# Patient Record
Sex: Male | Born: 1970 | Race: Black or African American | Hispanic: No | Smoking: Current some day smoker
Health system: Southern US, Community
[De-identification: ages and names within clinical notes are randomized; demographics above are authoritative.]

## PROBLEM LIST (undated history)

## (undated) DIAGNOSIS — S72009A Fracture of unspecified part of neck of unspecified femur, initial encounter for closed fracture: Secondary | ICD-10-CM

## (undated) DIAGNOSIS — M069 Rheumatoid arthritis, unspecified: Secondary | ICD-10-CM

## (undated) DIAGNOSIS — M1990 Unspecified osteoarthritis, unspecified site: Secondary | ICD-10-CM

## (undated) DIAGNOSIS — K5090 Crohn's disease, unspecified, without complications: Secondary | ICD-10-CM

## (undated) DIAGNOSIS — T7840XA Allergy, unspecified, initial encounter: Secondary | ICD-10-CM

## (undated) DIAGNOSIS — I10 Essential (primary) hypertension: Secondary | ICD-10-CM

## (undated) HISTORY — DX: Crohn's disease, unspecified, without complications: K50.90

## (undated) HISTORY — DX: Allergy, unspecified, initial encounter: T78.40XA

---

## 2011-08-09 ENCOUNTER — Encounter (HOSPITAL_COMMUNITY): Payer: Self-pay | Admitting: *Deleted

## 2011-08-09 ENCOUNTER — Emergency Department (HOSPITAL_COMMUNITY)
Admission: EM | Admit: 2011-08-09 | Discharge: 2011-08-09 | Disposition: A | Discharge status: Home / Self Care | Payer: Medicaid - Out of State | Attending: Emergency Medicine | Admitting: Emergency Medicine

## 2011-08-09 DIAGNOSIS — Z79899 Other long term (current) drug therapy: Secondary | ICD-10-CM | POA: Insufficient documentation

## 2011-08-09 DIAGNOSIS — R109 Unspecified abdominal pain: Secondary | ICD-10-CM | POA: Insufficient documentation

## 2011-08-09 DIAGNOSIS — R609 Edema, unspecified: Secondary | ICD-10-CM | POA: Insufficient documentation

## 2011-08-09 DIAGNOSIS — W010XXA Fall on same level from slipping, tripping and stumbling without subsequent striking against object, initial encounter: Secondary | ICD-10-CM | POA: Insufficient documentation

## 2011-08-09 DIAGNOSIS — M7989 Other specified soft tissue disorders: Secondary | ICD-10-CM

## 2011-08-09 DIAGNOSIS — M79609 Pain in unspecified limb: Secondary | ICD-10-CM

## 2011-08-09 DIAGNOSIS — M25473 Effusion, unspecified ankle: Secondary | ICD-10-CM | POA: Insufficient documentation

## 2011-08-09 DIAGNOSIS — M549 Dorsalgia, unspecified: Secondary | ICD-10-CM | POA: Insufficient documentation

## 2011-08-09 DIAGNOSIS — S32609A Unspecified fracture of unspecified ischium, initial encounter for closed fracture: Secondary | ICD-10-CM | POA: Insufficient documentation

## 2011-08-09 DIAGNOSIS — I10 Essential (primary) hypertension: Secondary | ICD-10-CM | POA: Insufficient documentation

## 2011-08-09 DIAGNOSIS — M25559 Pain in unspecified hip: Secondary | ICD-10-CM | POA: Insufficient documentation

## 2011-08-09 DIAGNOSIS — M25476 Effusion, unspecified foot: Secondary | ICD-10-CM | POA: Insufficient documentation

## 2011-08-09 DIAGNOSIS — S32601S Unspecified fracture of right ischium, sequela: Secondary | ICD-10-CM

## 2011-08-09 HISTORY — DX: Fracture of unspecified part of neck of unspecified femur, initial encounter for closed fracture: S72.009A

## 2011-08-09 HISTORY — DX: Essential (primary) hypertension: I10

## 2011-08-09 MED ORDER — OXYCODONE-ACETAMINOPHEN 5-325 MG PO TABS
2.0000 | ORAL_TABLET | Freq: Once | ORAL | Status: AC
  Administered 2011-08-09: 2 via ORAL
  Filled 2011-08-09: qty 2

## 2011-08-09 MED ORDER — OXYCODONE-ACETAMINOPHEN 5-325 MG PO TABS: ORAL_TABLET | ORAL | Status: AC

## 2011-08-09 NOTE — ED Provider Notes (Signed)
History   This chart was scribed for Mark [name]. Mark Lamas, MD by Mark [name]. The patient was seen in room STRE2/STRE2. Patient's care was started at 1347.    CSN: [id number]  Arrival date & time 08/09/11  1347   None     Chief Complaint  Patient presents with  . Hip Pain    (Consider location/radiation/quality/duration/timing/severity/associated sxs/prior treatment) HPI Mark [name] is a 41 y.o. male who presents to the Emergency Department complaining of constant moderate to severe pain to pelvic region onset 2 weeks ago and persistent since. Patient notes pain is aggravated with movement. Denies numbness, tingling, weakness, nausea, vomiting. Patient notes he was evaluated in [place], [place] on July 27, 2011 and was informed he had fractured his Ischium. Patient notes he sustained fracture during fall in which he was standing on a hill and tripped causing him to land on his left side. Patient notes he was set to move/relocate from [place] to [place], [place] the day after the evaluation was performed in [place], [place] and has thus been unable to follow up with another provider regarding further treatment and evaluation. Patient with h/o HTN.   Past Medical History  Diagnosis Date  . Hip fracture   . Hypertension     History reviewed. No pertinent past surgical history.  History reviewed. No pertinent family history.  History  Substance Use Topics  . Smoking status: Not on file  . Smokeless tobacco: Not on file  . Alcohol Use: No      Review of Systems  Constitutional: Negative for fever and chills.  Respiratory: Negative for cough and shortness of breath.   Cardiovascular: Negative for chest pain.  Gastrointestinal: Negative for nausea and vomiting.  Genitourinary: Negative for difficulty urinating.  Musculoskeletal: Positive for back pain and arthralgias.       Pelvic pain.   Skin: Negative for rash and wound.  Neurological: Negative for weakness and  numbness.    Allergies  Review of patient's allergies indicates no known allergies.  Home Medications   Current Outpatient Rx  Name Route Sig Dispense Refill  . VITAMIN D PO Oral Take 1 tablet by mouth daily.    . IBUPROFEN 200 MG PO TABS Oral Take 600 mg by mouth every 6 (six) hours as needed. For pain.    . OXYCODONE-ACETAMINOPHEN 5-325 MG PO TABS Oral Take 1 tablet by mouth every 4 (four) hours as needed. For pain.    . OXYCODONE-ACETAMINOPHEN 5-325 MG PO TABS  1-2 tablets po q 6 hours prn moderate to severe pain 20 tablet 0    BP 148/82  Pulse 90  Temp(Src) 98.1 F (36.7 C) (Oral)  Resp 18  SpO2 99%  Physical Exam  Nursing note and vitals reviewed. Constitutional: He is oriented to person, place, and time. He appears well-developed and well-nourished. No distress.  HENT:  Head: Normocephalic and atraumatic.  Eyes: EOM are normal.  Neck: Neck supple. No tracheal deviation present.  Cardiovascular: Normal rate.   Pulmonary/Chest: Effort normal. No respiratory distress.  Musculoskeletal: Normal range of motion. He exhibits edema (1+ pitting edema of RLE and right ankle. ).       Right ankle: Achilles tendon normal.       DP and PT pulses intact within RLE. Tender right hip to palpation,. Pain with ROM of RLE.  1+ edema to right foot, ankle, lower leg.    Neurological: He is alert and oriented to person, place, and time. He has normal strength.  No sensory deficit. GCS eye subscore is 4. GCS verbal subscore is 5. GCS motor subscore is 6.       Gross sensation and strength intact within RLE.   Skin: Skin is warm and dry.  Psychiatric: He has a normal mood and affect. His behavior is normal.    ED Course  Procedures (including critical care time)  DIAGNOSTIC STUDIES: Oxygen Saturation is 99% on room air, normal by my interpretation.    COORDINATION OF CARE: 2:45PM-Patient informed of current plan for treatment and evaluation and agrees with plan at this time. Patient's  accompanying records from evaluation performed in [place] reviewed.     Labs Reviewed - No data to display No results found.   1. Ischium fracture, right, sequela     4:20 PM Prelim report for U/S doppler for DVT is negative by report.  Will d/c home.  Pt is instructed on elevation while at rest.  MDM  I personally performed the services described in this documentation, which was scribed in my presence. The recorded information has been reviewed and considered.   I reviewed the films brought by pt from [place].  Shows non displaced ischium fracture, involved acetabulum.  I discussed with orthopedist on call, agrees to help arrange follow up.  Percocet refilled.  Due to edema, will get U/S to r/o DVT.  Low pretest suspicion however.     Mark [name]. Mark Alan, MD 08/09/11 1610

## 2011-08-09 NOTE — Discharge Instructions (Signed)
Narcotic and benzodiazepine use may cause drowsiness, slowed breathing or dependence.  Please use with caution and do not drive, operate machinery or watch young children alone while taking them.  Taking combinations of these medications or drinking alcohol will potentiate these effects.    

## 2011-08-09 NOTE — ED Notes (Signed)
Reports fracturing right hip 4/25 when [place] lived in [place], has been unable to make follow up appt since moving here and having severe pain, ambulatory at triage with crutches.

## 2011-08-09 NOTE — Progress Notes (Signed)
Right lower extremity venous duplex completed at 15:00.  Preliminary report is negative for DVT, SVT, or a Baker's cyst in the right leg.  Negative for DVT in the left common femoral vein.

## 2011-08-11 ENCOUNTER — Other Ambulatory Visit (HOSPITAL_COMMUNITY): Payer: Self-pay | Admitting: Orthopedic Surgery

## 2011-08-11 DIAGNOSIS — M545 Low back pain, unspecified: Secondary | ICD-10-CM

## 2011-08-16 ENCOUNTER — Ambulatory Visit (HOSPITAL_COMMUNITY)
Admission: RE | Admit: 2011-08-16 | Discharge: 2011-08-16 | Disposition: A | Discharge status: Home / Self Care | Payer: BC Managed Care – HMO | Source: Ambulatory Visit | Attending: Orthopedic Surgery | Admitting: Orthopedic Surgery

## 2011-08-16 DIAGNOSIS — M545 Low back pain, unspecified: Secondary | ICD-10-CM

## 2013-07-12 ENCOUNTER — Other Ambulatory Visit: Payer: Self-pay

## 2013-07-12 ENCOUNTER — Encounter (HOSPITAL_COMMUNITY): Payer: Self-pay | Admitting: Emergency Medicine

## 2013-07-12 ENCOUNTER — Emergency Department (HOSPITAL_COMMUNITY)
Admission: EM | Admit: 2013-07-12 | Discharge: 2013-07-12 | Disposition: A | Discharge status: Home / Self Care | Payer: Medicare (Managed Care) | Attending: Emergency Medicine | Admitting: Emergency Medicine

## 2013-07-12 ENCOUNTER — Emergency Department (HOSPITAL_COMMUNITY): Payer: Medicare (Managed Care)

## 2013-07-12 DIAGNOSIS — I10 Essential (primary) hypertension: Secondary | ICD-10-CM | POA: Insufficient documentation

## 2013-07-12 DIAGNOSIS — Z8781 Personal history of (healed) traumatic fracture: Secondary | ICD-10-CM | POA: Insufficient documentation

## 2013-07-12 DIAGNOSIS — R0789 Other chest pain: Secondary | ICD-10-CM

## 2013-07-12 DIAGNOSIS — R071 Chest pain on breathing: Secondary | ICD-10-CM | POA: Insufficient documentation

## 2013-07-12 LAB — CBC
HEMATOCRIT: 39.2 % (ref 39.0–52.0)
Hemoglobin: 13.2 g/dL (ref 13.0–17.0)
MCH: 30.8 pg (ref 26.0–34.0)
MCHC: 33.7 g/dL (ref 30.0–36.0)
MCV: 91.4 fL (ref 78.0–100.0)
Platelets: 378 10*3/uL (ref 150–400)
RBC: 4.29 MIL/uL (ref 4.22–5.81)
RDW: 14.5 % (ref 11.5–15.5)
WBC: 10.8 10*3/uL — ABNORMAL HIGH (ref 4.0–10.5)

## 2013-07-12 LAB — BASIC METABOLIC PANEL
BUN: 15 mg/dL (ref 6–23)
CALCIUM: 9.1 mg/dL (ref 8.4–10.5)
CHLORIDE: 100 meq/L (ref 96–112)
CO2: 26 mEq/L (ref 19–32)
CREATININE: 0.81 mg/dL (ref 0.50–1.35)
Glucose, Bld: 82 mg/dL (ref 70–99)
Potassium: 4 mEq/L (ref 3.7–5.3)
Sodium: 140 mEq/L (ref 137–147)

## 2013-07-12 LAB — I-STAT TROPONIN, ED: Troponin i, poc: 0 ng/mL (ref 0.00–0.08)

## 2013-07-12 MED ORDER — CYCLOBENZAPRINE HCL 10 MG PO TABS: 10.0000 mg | ORAL_TABLET | Freq: Two times a day (BID) | ORAL | Status: DC | PRN

## 2013-07-12 MED ORDER — KETOROLAC TROMETHAMINE 30 MG/ML IJ SOLN: 30.0000 mg | Freq: Once | INTRAMUSCULAR | Status: DC

## 2013-07-12 MED ORDER — KETOROLAC TROMETHAMINE 60 MG/2ML IM SOLN
60.0000 mg | Freq: Once | INTRAMUSCULAR | Status: AC
  Administered 2013-07-12: 60 mg via INTRAMUSCULAR
  Filled 2013-07-12: qty 2

## 2013-07-12 MED ORDER — NAPROXEN 500 MG PO TABS: 500.0000 mg | ORAL_TABLET | Freq: Two times a day (BID) | ORAL | Status: DC

## 2013-07-12 NOTE — ED Provider Notes (Signed)
Medical screening examination/treatment/procedure(s) were performed by non-physician practitioner and as supervising physician I was immediately available for consultation/collaboration.   EKG Interpretation   Date/Time:  Saturday July 12 2013 10:13:50 EDT Ventricular Rate:  78 PR Interval:  156 QRS Duration: 92 QT Interval:  356 QTC Calculation: 405 R Axis:   63 Text Interpretation:  Normal sinus rhythm Normal ECG No old tracing to  compare Confirmed by Winter Jocelyn  MD-I, Javaun Dimperio ([id number]) on 07/12/2013 12:24:27 PM      Devoria Albe, MD, Franz Dell, MD 07/12/13 1556

## 2013-07-12 NOTE — Discharge Instructions (Signed)
Take Naprosyn as needed for pain. Take Flexeril as needed for muscle spasm. You may take these medications together. Follow up with a primary care provider from the resource guide. Return to the ED with worsening or concerning symptoms.    Emergency Department Resource Guide 1) Find a Doctor and Pay Out of Pocket Although you won't have to find out who is covered by your insurance plan, it is a good idea to ask around and get recommendations. You will then need to call the office and see if the doctor you have chosen will accept you as a new patient and what types of options they offer for patients who are self-pay. Some doctors offer discounts or will set up payment plans for their patients who do not have insurance, but you will need to ask so you aren't surprised when you get to your appointment.  2) Contact Your Local Health Department Not all health departments have doctors that can see patients for sick visits, but many do, so it is worth a call to see if yours does. If you don't know where your local health department is, you can check in your phone book. The CDC also has a tool to help you locate your state's health department, and many state websites also have listings of all of their local health departments.  3) Find a Walk-in Clinic If your illness is not likely to be very severe or complicated, you may want to try a walk in clinic. These are popping up all over the country in pharmacies, drugstores, and shopping centers. They're usually staffed by nurse practitioners or physician assistants that have been trained to treat common illnesses and complaints. They're usually fairly quick and inexpensive. However, if you have serious medical issues or chronic medical problems, these are probably not your best option.  No Primary Care Doctor: - Call Health Connect at  [phone] - they can help you locate a primary care doctor that  accepts your insurance, provides certain services,  etc. - Physician Referral Service- [phone]  Chronic Pain Problems: Organization         Address  Phone   Notes  [facility]  [phone] Patients need to be referred by their primary care doctor.   Medication Assistance: Organization         Address  Phone   Notes  [facility] Medication [facility] [address] [place] [place], [place] [address] [phone] --Must be a resident of [facility] -- Must have NO insurance coverage whatsoever (no Medicaid/ Medicare, etc.) -- The pt. MUST have a primary care doctor that directs their care regularly and follows them in the community   [facility]  [phone]   [employer]  [phone]    Agencies that provide inexpensive medical care: Organization         Address  Phone   Notes  [facility]  [phone]   [facility]    [phone]   [facility] [address] [place], [place] [address] [phone]   [facility] [address] [place] [place] [phone]   Planned Parenthood    [phone]   [facility]    [phone]   [facility]  [address], [place] Phone:  [phone], Fax:  [phone] Hours of Operation:  9 am - 6 pm, M-F.  Also accepts Medicaid/Medicare and  self-pay.  [facility] for [place], [address], [place] Phone: [phone], Fax: [phone]. Hours of Operation:  8:30 am - 5:30 pm, M-F.  Also accepts Medicaid and self-pay.  [facility] [place] [address] [place] Phone: [phone]   [place], [place], [place] [phone], Ext. [phone] Mondays & Thursdays: 7-9 AM.  First 15 patients are seen on a first come, first serve basis.    [place] Providers:  Organization         Address  Phone   Notes  [facility] [address], [place] [phone] Also accepts self-pay patients.  [facility] [id number] [place], [place]  [phone]   [place], [address], [place] [phone]   [facility] [address], [place] [phone]   Lucianne Lei [address], [place]   [phone] Only accepts [place] Access [place] patients after they have their name applied to their card.   Self-Pay (no insurance) in [facility]:  Organization         Address  Phone   Notes  Sickle Cell Patients, [facility] [place] [phone]   [facility] [place] [phone]   [facility] [place]  [place], [place], [place] [phone]   [facility]/Dr. Osei-Bonsu  [address], [place] or [address], [place] [phone] Phone number for both [place] and [place] locations is the same.  Urgent Medical and [facility] [place] [address] [place] [phone]   [facility] [address], [place] or [address] [phone]   [facility] [address] [place] [phone], phone; [phone], fax Sees patients 1st and 3rd Saturday of every month.  Must not qualify for public or private insurance (i.e. Medicaid, Medicare, [place] Health Choice, Veterans' Benefits)  Household income should be no more than 200% of the poverty level The clinic cannot treat you if you are pregnant or think you are pregnant  Sexually transmitted diseases are not treated at the clinic.    Dental Care: Organization         Address  Phone  Notes  [facility] [place] [phone] Accepts children up to  age 32 who are enrolled in [place] or [place]; pregnant women with a Medicaid card; and children who have applied for Medicaid or [place] Health Choice, but were declined, whose parents can pay a reduced fee at time of service.  [facility] Department of [facility]  [address], [place] [phone] Accepts children up to age 69 who are enrolled in [place] or [place]; pregnant women with a Medicaid card; and children who have applied for Medicaid or  Health Choice, but were declined, whose parents can pay a reduced fee at time of service.  [facility] Access PROGRAM  [place] [phone] Patients are seen by appointment only. Walk-ins are not accepted. [place] will see patients 72 years of age and older. Monday - Tuesday (8am-5pm) Most Wednesdays (8:30-5pm) $  30 per visit, cash only  [facility] Adult [employer] PROGRAM  [address], [facility] [phone] Patients are seen by appointment only. Walk-ins are not accepted. [facility] will see patients 1 years of age and older. One Wednesday Evening (Monthly: Volunteer Based).  $30 per visit, cash only  [employer]  [phone] for adults; Children under age 5, call Graduate Pediatric Dentistry at [phone]. Children aged 26-14, please call [phone] to request a pediatric application.  Dental services are provided in all areas of dental care including fillings, crowns and bridges, complete and partial dentures, implants, gum treatment, root canals, and extractions. Preventive care is also provided. Treatment is provided to both adults and children. Patients are selected via a lottery and there is often a waiting list.   [facility] [address] [place]  [phone] [email or web address]   [facility] [address] [place], [place] [phone], Ext. [phone] Second and Fourth Thursday of  each month, opens at 6:30 AM; Clinic ends at 9 AM.  Patients are seen on a first-come first-served basis, and a limited number are seen during each clinic.   [facility]  [address] Ether [place], [place] [phone]   Eligibility Requirements You must have lived in [place], [place], or [place] counties for at least the last three months.   You cannot be eligible for state or federal sponsored [place], including [employer], [place], or [employer].   You generally cannot be eligible for healthcare insurance through your employer.    How to apply: Eligibility screenings are held every Tuesday and Wednesday afternoon from 1:00 pm until 4:00 pm. You do not need an appointment for the interview!  [facility] [address], [place], [place] [phone]   [facility] Health Department  [phone]   [facility] Health Department  [phone]   [facility] Health Department  [phone]    Behavioral Health Resources in the Community: Intensive Outpatient Programs Organization         Address  Phone  Notes  [facility] Services [address] [place], [place] [phone]   [facility] Outpatient [address] [place], [place] [phone]   ADS: Alcohol & Drug Sv[facility] [address] [place], [place]  [phone]   [facility] [address]  [place], [place] [phone] or [phone]   Substance Abuse Resources Organization         Address  Phone  Notes  Al[facility] Drug Services  [phone]   [facility]  [phone]   [facility] [place]  [phone]   Floydene Flock  [phone]   Residential & Outpatient Substance Abuse Program  [phone]   Psychological Services Organization         Address  Phone  Notes  [facility] Behavior[facility] Health  [phone]   [facility]  [phone]   [facility] Mental Health [address]  [place] [phone] or [phone]    Mobile Crisis Teams Organization         Address  Phone  Notes  [facility], Mobile Crisis Care Unit  [phone]   [facility]  [address] [place], [place] [phone]   Doristine Locks [address] [place] [phone]    Self-Help/Support Groups Organization         Address  Phone  Notes  [facility] [place] - variety of support groups  [place] Call for more information  Narcotics Anonymous (NA), Caring [facility] [address], [employer] [place]  2 meetings at this location   Special educational needs teacher         Address  Phone  Notes  [facility] [place],    [place]  [phone]   [facility]  [address], [place] [phone], [place], [place]   [place], [place] [phone] Admissions: 8am-3pm M-F  Incentives Substance [place] [address],    [place], [place] [phone]   [facility] [address] [place], [place], [place]   [facility] [address]  [place], [place]   [facility] - Intensive Outpatient [address], [place], [place]   [facility] ([place]  [place], [place] [phone] or [phone]   [facility] Services (RTS) [address] [place], [place] Accepts Medicaid  [facility] [address],  [place] [phone] Substance Abuse/Addiction Treatment   [facility] Organization         Address  Phone  Notes  [facility]  [phone]   Domenic Schwab, PhD [address] [place], [place]   [phone] or [phone]   [place], [place] [phone]     [facility] [address], [place], [place] [phone] Insurance/Medicaid/sponsorship through [facility] and Families [address], [address] [place]                                    [place], [place] [phone] [place] [address], [place] [phone]    Dr. Adele Schilder  [phone]   [facility] Dept. 1) [address], [place] 2) [place] 3)  [place] [address], [place] [phone]  [phone]   [place] [phone] or [phone] (After Hours)

## 2013-07-12 NOTE — ED Notes (Signed)
Reports left side chest pains that he describes as muscle spasms, increase with movement. Having pain x 2 weeks, more severe x 3 days. Denies recent cough. ekg done at triage.

## 2013-07-12 NOTE — ED Provider Notes (Signed)
CSN: [id number]     Arrival date & time 07/12/13  1011 History   First MD Initiated Contact with Patient 07/12/13 1018     Chief Complaint  Patient presents with  . Chest Pain     (Consider location/radiation/quality/duration/timing/severity/associated sxs/prior Treatment) HPI Comments: Patient is 43 year old male complaining of chest pain for a few weeks. The pain is in the left side of his chest and radiates up the neck. The pain feels "spasm-like" and is worse with movement.  He has no associated nausea or vomiting, fever, diaphoresis, COB, heart palpitations, radiation to the back, abdominal pain, urinary symptoms.  He has tried to take Ibuprofen with minimal relief.  There were no recent injuries or heavy lifting events prior to the onset of pain.  He states that he has been having pain in the right arm as well, but his left sided chest pain is what made him seek treatment.  He denies tobacco, alcohol or illicit drug use.  He is a healthy man with no other major medical concerns and takes no other medications.    Past Medical History  Diagnosis Date  . Hip fracture   . Hypertension    History reviewed. No pertinent past surgical history. History reviewed. No pertinent family history. History  Substance Use Topics  . Smoking status: Not on file  . Smokeless tobacco: Not on file  . Alcohol Use: No    Review of Systems  Constitutional: Negative for fever, chills and fatigue.  HENT: Negative for trouble swallowing.   Eyes: Negative for visual disturbance.  Respiratory: Negative for shortness of breath.   Cardiovascular: Positive for chest pain. Negative for palpitations.  Gastrointestinal: Negative for nausea, vomiting, abdominal pain and diarrhea.  Genitourinary: Negative for dysuria and difficulty urinating.  Musculoskeletal: Negative for arthralgias and neck pain.  Skin: Negative for color change.  Neurological: Negative for dizziness and weakness.  Psychiatric/Behavioral:  Negative for dysphoric mood.      Allergies  Review of patient's allergies indicates no known allergies.  Home Medications   Current Outpatient Rx  Name  Route  Sig  Dispense  Refill  . ibuprofen (ADVIL,MOTRIN) 200 MG tablet   Oral   Take 600 mg by mouth every 6 (six) hours as needed for fever, headache or mild pain. For pain.          BP 144/93  Pulse 92  Temp(Src) 98 F (36.7 C) (Oral)  Resp 17  Ht 5\' 10"  (1.778 m)  Wt 190 lb (86.183 kg)  BMI 27.26 kg/m2  SpO2 98% Physical Exam  Nursing note and vitals reviewed. Constitutional: He is oriented to person, place, and time. He appears well-developed and well-nourished. No distress.  HENT:  Head: Normocephalic and atraumatic.  Eyes: Conjunctivae and EOM are normal.  Neck: Normal range of motion.  Cardiovascular: Normal rate and regular rhythm.  Exam reveals no gallop and no friction rub.   No murmur heard. Pulmonary/Chest: Effort normal and breath sounds normal. He has no wheezes. He has no rales. He exhibits tenderness.  Left chest and trapezius tenderness to palpation.   Abdominal: Soft. He exhibits no distension. There is no tenderness. There is no rebound and no guarding.  Musculoskeletal: Normal range of motion.  Neurological: He is alert and oriented to person, place, and time. Coordination normal.  Speech is goal-oriented. Moves limbs without ataxia.   Skin: Skin is warm and dry.  Psychiatric: He has a normal mood and affect. His behavior is normal.  ED Course  Procedures (including critical care time) Labs Review Labs Reviewed  CBC - Abnormal; Notable for the following:    WBC 10.8 (*)    All other components within normal limits  BASIC METABOLIC PANEL  I-STAT TROPOININ, ED   Imaging Review Dg Chest 2 View  07/12/2013   CLINICAL DATA:  Left chest pain radiating to the left shoulder.  EXAM: CHEST  2 VIEW  COMPARISON:  None.  FINDINGS: The heart size and mediastinal contours are within normal limits.  Both lungs are clear. The visualized skeletal structures are unremarkable.  IMPRESSION: No active cardiopulmonary disease.   Electronically Signed   By: Amie Portland M.D.   On: 07/12/2013 11:11     EKG Interpretation   Date/Time:  Saturday July 12 2013 10:13:50 EDT Ventricular Rate:  78 PR Interval:  156 QRS Duration: 92 QT Interval:  356 QTC Calculation: 405 R Axis:   63 Text Interpretation:  Normal sinus rhythm Normal ECG No old tracing to  compare Confirmed by KNAPP  MD-I, IVA ([id number]) on 07/12/2013 12:24:27 PM      MDM   Final diagnoses:  Chest wall pain    11:27 AM Labs pending. EKG and chest xray show no acute changes. Vitals stable and patient afebrile. Patient will have IV toradol for pain.   12:49 PM Patient is feeling better after IM toradol. Patient's troponin is negative and other labs show no acute changes. Patient likely having muscular pain. Patient will be discharged with Naprosyn and Robaxin. Patient advised to follow up with a PCP from the resource guide. Patient instructed to return to the ED with worsening or concerning symptoms. Patient's HEART score is a 1, making him low risk for major cardiac event.    Emilia Beck, PA-C 07/12/13 1255

## 2014-01-15 ENCOUNTER — Encounter (HOSPITAL_COMMUNITY): Payer: Self-pay | Admitting: Emergency Medicine

## 2014-01-15 ENCOUNTER — Emergency Department (HOSPITAL_COMMUNITY)
Admission: EM | Admit: 2014-01-15 | Discharge: 2014-01-15 | Disposition: A | Discharge status: Home / Self Care | Payer: PRIVATE HEALTH INSURANCE | Attending: Emergency Medicine | Admitting: Emergency Medicine

## 2014-01-15 ENCOUNTER — Emergency Department (HOSPITAL_COMMUNITY): Payer: PRIVATE HEALTH INSURANCE

## 2014-01-15 DIAGNOSIS — Z8781 Personal history of (healed) traumatic fracture: Secondary | ICD-10-CM | POA: Insufficient documentation

## 2014-01-15 DIAGNOSIS — Z791 Long term (current) use of non-steroidal anti-inflammatories (NSAID): Secondary | ICD-10-CM | POA: Insufficient documentation

## 2014-01-15 DIAGNOSIS — I10 Essential (primary) hypertension: Secondary | ICD-10-CM | POA: Insufficient documentation

## 2014-01-15 DIAGNOSIS — Z79899 Other long term (current) drug therapy: Secondary | ICD-10-CM | POA: Insufficient documentation

## 2014-01-15 DIAGNOSIS — M76822 Posterior tibial tendinitis, left leg: Secondary | ICD-10-CM

## 2014-01-15 MED ORDER — NAPROXEN 500 MG PO TABS: 500.0000 mg | ORAL_TABLET | Freq: Two times a day (BID) | ORAL | Status: DC

## 2014-01-15 MED ORDER — TRAMADOL HCL 50 MG PO TABS: 50.0000 mg | ORAL_TABLET | Freq: Four times a day (QID) | ORAL | Status: DC | PRN

## 2014-01-15 NOTE — ED Provider Notes (Signed)
CSN: [id number]     Arrival date & time 01/15/14  3614 History   First MD Initiated Contact with Patient 01/15/14 1008     Chief Complaint  Patient presents with  . Ankle Pain     (Consider location/radiation/quality/duration/timing/severity/associated sxs/prior Treatment) HPI Mark [name] is a 43 y.o. male Presents to emergency department complaining of left ankle and foot pain. Patient states his pain began approximately 2 weeks ago. He denies any known injury. He states he walks a lot and is on his feet at work all day. He states that he is now having swelling in the ankle and foot. He denies any fever chills. No numbness or weakness in his foot. No treatments child prior to coming in. No prior ankle or foot problems.   Past Medical History  Diagnosis Date  . Hip fracture   . Hypertension    History reviewed. No pertinent past surgical history. No family history on file. History  Substance Use Topics  . Smoking status: Never Smoker   . Smokeless tobacco: Not on file  . Alcohol Use: Yes    Review of Systems  Constitutional: Negative for fever and chills.  Respiratory: Negative for cough, chest tightness and shortness of breath.   Cardiovascular: Negative for chest pain, palpitations and leg swelling.  Musculoskeletal: Positive for arthralgias and joint swelling. Negative for myalgias.  Skin: Negative for rash.  Allergic/Immunologic: Negative for immunocompromised state.  Neurological: Negative for weakness, numbness and headaches.      Allergies  Review of patient's allergies indicates no known allergies.  Home Medications   Prior to Admission medications   Medication Sig Start Date End Date Taking? Authorizing Provider  cyclobenzaprine (FLEXERIL) 10 MG tablet Take 1 tablet (10 mg total) by mouth 2 (two) times daily as needed for muscle spasms. 07/12/13   Kaitlyn Szekalski, PA-C  ibuprofen (ADVIL,MOTRIN) 200 MG tablet Take 600 mg by mouth every 6 (six) hours as  needed for fever, headache or mild pain. For pain.    Historical Provider, MD  naproxen (NAPROSYN) 500 MG tablet Take 1 tablet (500 mg total) by mouth 2 (two) times daily with a meal. 07/12/13   Kaitlyn Szekalski, PA-C   BP 165/96  Pulse 68  Temp(Src) 98.2 F (36.8 C) (Oral)  Resp 19  Ht 5\' 10"  (1.778 m)  Wt 185 lb (83.915 kg)  BMI 26.54 kg/m2  SpO2 100% Physical Exam  Nursing note and vitals reviewed. Constitutional: He appears well-developed and well-nourished. No distress.  HENT:  Head: Normocephalic.  Eyes: Conjunctivae are normal.  Neck: Neck supple.  Musculoskeletal:  Swelling noted around medial malleolus of the left foot. Tenderness to palpation over medial malleolus and surrounding tissue. Tenderness over left heel, left plantar fascia. Normal lateral malleolus and foot otherwise. Patient is able to extend and flex all toes. Full range of motion of the ankle. no bruising or discoloration.  Neurological: He is alert.  Skin: Skin is warm and dry.    ED Course  Procedures (including critical care time) Labs Review Labs Reviewed - No data to display  Imaging Review Dg Ankle Complete Left  01/15/2014   CLINICAL DATA:  Heel pain, posterior ankle pain, no injury  EXAM: LEFT ANKLE COMPLETE - 3+ VIEW  COMPARISON:  None.  FINDINGS: Three views of left ankle submitted. No acute fracture or subluxation. Ankle mortise is preserved.  IMPRESSION: Negative.   Electronically Signed   By: 01/17/2014 M.D.   On: 01/15/2014 10:43   Dg Foot  Complete Left  01/15/2014   CLINICAL DATA:  43 year old male with pain in the posterior aspect of the heel of the foot.  EXAM: LEFT FOOT - COMPLETE 3+ VIEW  COMPARISON:  No priors.  FINDINGS: Multiple views of the left foot demonstrate no acute displaced fracture, subluxation, dislocation, or soft tissue abnormality.  IMPRESSION: No acute radiographic abnormality of the left foot.   Electronically Signed   By: Trudie Reed M.D.   On: 01/15/2014 10:45      EKG Interpretation None      MDM   Final diagnoses:  Posterior tibial tendonitis, left     patient with nontraumatic pain to his left foot and ankle. X-rays obtained and are negative. There is swelling on exam and tenderness. Suspect tendinitis. Will treat with NSAIDs, Ultram for severe pain. Ace wrap provided. Instructed to get orthotic arch supports. Follow up  Outpatient for further treatment if not improving  Filed Vitals:   01/15/14 1000 01/15/14 1104  BP: 165/96 144/78  Pulse: 68 65  Temp: 98.2 F (36.8 C) 97.4 F (36.3 C)  TempSrc: Oral Oral  Resp: 19   Height: 5\' 10"  (1.778 m)   Weight: 185 lb (83.915 kg)   SpO2: 100% 100%       Jailynn Lavalais A Lilliann Rossetti, PA-C 01/15/14 1505

## 2014-01-15 NOTE — Discharge Instructions (Signed)
Naprosyn for pain and inflammation. Tramadol for severe pain only. Make sure to wear good arch supports in your shoes. Keep foot elevated when at home sitting down. Ice several times a day. Follow up with orthopedics specialist if not improving.    Posterior Tibial Tendon Tendinitis with Rehab Tendonitis is a condition that is characterized by inflammation of a tendon or the lining (sheath) that surrounds it. The inflammation is usually caused by damage to the tendon, such as a tendon tear (strain). Sprains are classified into three categories. Grade 1 sprains cause pain, but the tendon is not lengthened. Grade 2 sprains include a lengthened ligament due to the ligament being stretched or partially ruptured. With grade 2 sprains there is still function, although the function may be diminished. Grade 3 sprains are characterized by a complete tear of the tendon or muscle, and function is usually impaired. Posterior tibialis tendonitis is tendonitis of the posterior tibial tendon, which attaches muscles of the lower leg to the foot. The posterior tibial tendon is located in the back of the ankle and helps the body straighten (plantar flex) and rotate inward (medially rotate) the ankle. SYMPTOMS   Pain, tenderness, swelling, warmth, and/or redness over the back of the inner ankle at the posterior tibial tendon or the inner part of the mid-foot.  Pain that worsens with plantar flexion or medial rotation of the ankle.  A crackling sound (crepitation) when the tendon is moved or touched. CAUSES  Posterior tibial tendonitis occurs when damage to the posterior tibial tendon starts an inflammatory response. Common mechanisms of injury include:  Degenerative (occurs with aging) processes that weaken the tendon and make it more susceptible to injury.  Stress placed on the tendon from an increase in the intensity, frequency, or duration of training.  Direct trauma to the ankle.  Returning to activity before  a previous ankle injury is allowed to heal. RISK INCREASES WITH:  Activities that involve repetitive and/or stressful plantar flexion (jumping, kicking, or running up/down hills).  Poor strength and flexibility.  Flat feet.  Previous injury to the foot, ankle, or leg. PREVENTION   Warm up and stretch properly before activity.  Allow for adequate recovery between workouts.  Maintain physical fitness:  Strength, flexibility, and endurance.  Cardiovascular fitness.  Learn and use proper technique. When possible, have a coach correct improper technique.  Complete rehabilitation from a previous foot, ankle, or leg injury.  If you have flat feet, wear arch supports (orthotics). PROGNOSIS  If treated properly, the symptoms of tendonitis usually resolve within 6 weeks. This period may be shorter for injuries caused by direct trauma. RELATED COMPLICATIONS   Prolonged healing time, if improperly treated or reinjured.  Recurrent symptoms that result in a chronic problem.  Partial or complete tendon tear (rupture) requiring surgery. TREATMENT  Treatment initially involves the use of ice and medication to help reduce pain and inflammation. The use of strengthening and stretching exercises may help reduce pain with activity. These exercises may be performed at home or with referral to a therapist. Often times, your caregiver will recommend immobilizing the ankle to allow the tendon to heal. If you have flat feet, you may be advised to wear orthotic arch supports. If symptoms persist for greater than 6 months despite nonsurgical (conservative) treatment, then surgery may be recommended. MEDICATION   If pain medication is necessary, then nonsteroidal anti-inflammatory medications, such as aspirin and ibuprofen, or other minor pain relievers, such as acetaminophen, are often recommended.  Do not  take pain medication for 7 days before surgery.  Prescription pain relievers may be given if  deemed necessary by your caregiver. Use only as directed and only as much as you need.  Corticosteroid injections may be given by your caregiver. These injections should be reserved for the most serious cases because they may only be given a certain number of times. HEAT AND COLD  Cold treatment (icing) relieves pain and reduces inflammation. Cold treatment should be applied for 10 to 15 minutes every 2 to 3 hours for inflammation and pain and immediately after any activity that aggravates your symptoms. Use ice packs or massage the area with a piece of ice (ice massage).  Heat treatment may be used prior to performing the stretching and strengthening activities prescribed by your caregiver, physical therapist, or athletic trainer. Use a heat pack or soak the injury in warm water. SEEK MEDICAL CARE IF:  Treatment seems to offer no benefit, or the condition worsens.  Any medications produce adverse side effects. EXERCISES RANGE OF MOTION (ROM) AND STRETCHING EXERCISES - Posterior Tibial Tendon Tendinitis These exercises may help you when beginning to rehabilitate your injury. Your symptoms may resolve with or without further involvement from your physician, physical therapist or athletic trainer. While completing these exercises, remember:   Restoring tissue flexibility helps normal motion to return to the joints. This allows healthier, less painful movement and activity.  An effective stretch should be held for at least 30 seconds.  A stretch should never be painful. You should only feel a gentle lengthening or release in the stretched tissue. RANGE OF MOTION - Ankle Plantar Flexion   Sit with your right / left leg crossed over your opposite knee.  Use your opposite hand to pull the top of your foot and toes toward you.  You should feel a gentle stretch on the top of your foot/ankle. Hold this position for __________ seconds. Repeat __________ times. Complete this exercise __________  times per day.  RANGE OF MOTION - Ankle Eversion   Sit with your right / left ankle crossed over your opposite knee.  Grip your foot with your opposite hand, placing your thumb on the top of your foot and your fingers across the bottom of your foot.  Gently push your foot downward with a slight rotation so your littlest toes rise slightly.  You should feel a gentle stretch on the inside of your ankle. Hold the stretch for __________ seconds. Repeat __________ times. Complete this exercise __________ times per day.  RANGE OF MOTION - Ankle Inversion   Sit with your right / left ankle crossed over your opposite knee.  Grip your foot with your opposite hand, placing your thumb on the bottom of your foot and your fingers across the top of your foot.  Gently pull your foot so the smallest toe comes toward you and your thumb pushes the inside of the ball of your foot away from you.  You should feel a gentle stretch on the outside of your ankle. Hold the stretch for __________ seconds. Repeat __________ times. Complete this exercise __________ times per day.  RANGE OF MOTION - Dorsi/Plantar Flexion  While sitting with your right / left knee straight, draw the top of your foot upward by flexing your ankle. Then reverse the motion, pointing your toes downward.  Hold each position for __________ seconds.  After completing your first set of exercises, repeat this exercise with your knee bent. Repeat __________ times. Complete this exercise __________ times  per day.  RANGE OF MOTION - Ankle Alphabet  Imagine your right / left big toe is a pen.  Keeping your hip and knee still, write out the entire alphabet with your "pen." Make the letters as large as you can without increasing any discomfort. Repeat __________ times. Complete this exercise __________ times per day.  STRETCH - Gastrocsoleus   Sit with your right / left leg extended. Holding onto both ends of a belt or towel, loop it around  the ball of your foot.  Keeping your right / left ankle and foot relaxed and your knee straight, pull your foot and ankle toward you using the belt/towel.  You should feel a gentle stretch behind your calf or knee. Hold this position for __________ seconds. Repeat __________ times. Complete this exercise __________ times per day.  STRETCH - Gastroc, Standing   Place hands on wall.  Extend right / left leg, keeping the front knee somewhat bent.  Slightly point your toes inward on your back foot.  Keeping your right / left heel on the floor and your knee straight, shift your weight toward the wall, not allowing your back to arch.  You should feel a gentle stretch in the right / left calf. Hold this position for __________ seconds. Repeat __________ times. Complete this stretch __________ times per day. STRETCH - Soleus, Standing   Place hands on wall.  Extend right / left leg, keeping the other knee somewhat bent.  Slightly point your toes inward on your back foot.  Keep your right / left heel on the floor, bend your back knee, and slightly shift your weight over the back leg so that you feel a gentle stretch deep in your back calf.  Hold this position for __________ seconds. Repeat __________ times. Complete this stretch __________ times per day. STRENGTHENING EXERCISES - Posterior Tibial Tendon Tendinitis These exercises may help you when beginning to rehabilitate your injury. They may resolve your symptoms with or without further involvement from your physician, physical therapist, or athletic trainer. While completing these exercises, remember:   Muscles can gain both the endurance and the strength needed for everyday activities through controlled exercises.  Complete these exercises as instructed by your physician, physical therapist, or athletic trainer. Progress the resistance and repetitions only as guided. STRENGTH - Dorsiflexors  Secure a rubber exercise band/tubing to a  fixed object (i.e., table, pole) and loop the other end around your right / left foot.  Sit on the floor facing the fixed object. The band/tubing should be slightly tense when your foot is relaxed.  Slowly draw your foot back toward you using your ankle and toes.  Hold this position for __________ seconds. Slowly release the tension in the band and return your foot to the starting position. Repeat __________ times. Complete this exercise __________ times per day.  STRENGTH - Towel Curls  Sit in a chair positioned on a non-carpeted surface.  Place your foot on a towel, keeping your heel on the floor.  Pull the towel toward your heel by only curling your toes. Keep your heel on the floor.  If instructed by your physician, physical therapist, or athletic trainer, add ____________________ at the end of the towel. Repeat __________ times. Complete this exercise __________ times per day. STRENGTH - Ankle Eversion   Secure one end of a rubber exercise band/tubing to a fixed object (table, pole). Loop the other end around your foot just before your toes.  Place your fists between your knees.  This will focus your strengthening at your ankle.  Drawing the band/tubing across your opposite foot, slowly pull your little toe out and up. Make sure the band/tubing is positioned to resist the entire motion.  Hold this position for __________ seconds.  Have your muscles resist the band/tubing as it slowly pulls your foot back to the starting position. Repeat __________ times. Complete this exercise __________ times per day.  STRENGTH - Ankle Inversion   Secure one end of a rubber exercise band/tubing to a fixed object (table, pole). Loop the other end around your foot just before your toes.  Place your fists between your knees. This will focus your strengthening at your ankle.  Slowly, pull your big toe up and in, making sure the band/tubing is positioned to resist the entire motion.  Hold this  position for __________ seconds.  Have your muscles resist the band/tubing as it slowly pulls your foot back to the starting position. Repeat __________ times. Complete this exercises __________ times per day.  Document Released: 03/20/2005 Document Revised: 08/04/2013 Document Reviewed: 07/02/2008 [facility] Patient Information 2015 [place], [place]. This information is not intended to replace advice given to you by your health care provider. Make sure you discuss any questions you have with your health care provider.

## 2014-01-15 NOTE — ED Notes (Signed)
Reports left ankle pain for several weeks that has been gradually been getting worse. Is swollen, denies injury. Can bear wt, ambulatory.

## 2014-01-15 NOTE — ED Notes (Signed)
Declined W/C at D/C and was escorted to lobby by RN. 

## 2014-01-15 NOTE — ED Provider Notes (Signed)
Medical screening examination/treatment/procedure(s) were performed by non-physician practitioner and as supervising physician I was immediately available for consultation/collaboration.    Vida Roller, MD 01/15/14 2053

## 2017-02-19 ENCOUNTER — Other Ambulatory Visit: Payer: Self-pay

## 2017-02-19 ENCOUNTER — Emergency Department (HOSPITAL_COMMUNITY)
Admission: EM | Admit: 2017-02-19 | Discharge: 2017-02-19 | Disposition: A | Discharge status: Home / Self Care | Payer: BC Managed Care – PPO | Attending: Emergency Medicine | Admitting: Emergency Medicine

## 2017-02-19 ENCOUNTER — Encounter (HOSPITAL_COMMUNITY): Payer: Self-pay | Admitting: Emergency Medicine

## 2017-02-19 DIAGNOSIS — M069 Rheumatoid arthritis, unspecified: Secondary | ICD-10-CM | POA: Diagnosis not present

## 2017-02-19 DIAGNOSIS — L03115 Cellulitis of right lower limb: Secondary | ICD-10-CM

## 2017-02-19 DIAGNOSIS — Z79899 Other long term (current) drug therapy: Secondary | ICD-10-CM | POA: Diagnosis not present

## 2017-02-19 DIAGNOSIS — L02415 Cutaneous abscess of right lower limb: Secondary | ICD-10-CM | POA: Diagnosis present

## 2017-02-19 DIAGNOSIS — L0291 Cutaneous abscess, unspecified: Secondary | ICD-10-CM

## 2017-02-19 DIAGNOSIS — I10 Essential (primary) hypertension: Secondary | ICD-10-CM | POA: Insufficient documentation

## 2017-02-19 MED ORDER — LIDOCAINE-EPINEPHRINE (PF) 2 %-1:200000 IJ SOLN
20.0000 mL | Freq: Once | INTRAMUSCULAR | Status: AC
  Administered 2017-02-19: 20 mL
  Filled 2017-02-19: qty 20

## 2017-02-19 MED ORDER — DOXYCYCLINE HYCLATE 100 MG PO CAPS: 100.0000 mg | ORAL_CAPSULE | Freq: Two times a day (BID) | ORAL | 0 refills | Status: DC

## 2017-02-19 NOTE — ED Triage Notes (Signed)
Pt noticed a bump on his right lower side of calf on Thursday and since has increased in size and became hard and red. Pt states he takes a medication that makes him more prone to get boils and gets them frequently.

## 2017-02-19 NOTE — ED Provider Notes (Signed)
[name] DE[facility]MENT Provider Note   CSN: [id number] Arrival date & time: 02/19/17  1433     History   Chief Complaint Chief Complaint  Patient presents with  . Wound Check    HPI  Mark [name] is a 46 y.o. Male with a history of rheumatoid arthritis, currently on weekly Enbrel injections, presents complaining of bumps on the outer right calf. Patient reports he thinks this started as just a little bump her neck in the skin, but it is getting progressively larger over the last 5 or 6 days, and he's had increasing redness around the bump. Pt is unsure if he had any bug bites or injury to the area. Patient reports since he started taking Enbrel he's gotten some boils in his armpits similar to this, but never had any on the legs before. Patient reports there is a constant dull ache over the lesion, and it hurts worse if it gets bumped or hit. He reports he feels like his leg is swollen around the area, denies any numbness or tingling, no pain in the foot no wounds on the foot. Patient has not taken any medications to treat this pain. Patient denies fevers or chills, but reports generalized malaise. Patient reports he was at work today and one of his coworkers said he just didn't seem like himself and asked if something was wrong, so he decided to get this checked out. Tetanus is up-to-date.       Past Medical History:  Diagnosis Date  . Hip fracture (HCC)   . Hypertension     There are no active problems to display for this patient.   History reviewed. No pertinent surgical history.     Home Medications    Prior to Admission medications   Medication Sig Start Date End Date Taking? Authorizing Provider  cyclobenzaprine (FLEXERIL) 10 MG tablet Take 1 tablet (10 mg total) by mouth 2 (two) times daily as needed for muscle spasms. 07/12/13   Emilia Beck, PA-C  ibuprofen (ADVIL,MOTRIN) 200 MG tablet Take 600 mg by mouth every 6 (six) hours  as needed for fever, headache or mild pain. For pain.    [provider]  naproxen (NAPROSYN) 500 MG tablet Take 1 tablet (500 mg total) by mouth 2 (two) times daily with a meal. 07/12/13   Szekalski, Kaitlyn, PA-C  naproxen (NAPROSYN) 500 MG tablet Take 1 tablet (500 mg total) by mouth 2 (two) times daily. 01/15/14   Kirichenko, Lemont Fillers, PA-C  traMADol (ULTRAM) 50 MG tablet Take 1 tablet (50 mg total) by mouth every 6 (six) hours as needed. 01/15/14   Jaynie Crumble, PA-C    Family History No family history on file.  Social History Social History   Tobacco Use  . Smoking status: Never Smoker  Substance Use Topics  . Alcohol use: Yes  . Drug use: No     Allergies   Patient has no known allergies.   Review of Systems Review of Systems  Constitutional: Negative for chills and fatigue.  Gastrointestinal: Negative for nausea and vomiting.  Skin: Positive for color change. Negative for rash and wound.       Abscess  Neurological: Negative for weakness and numbness.     Physical Exam Updated Vital Signs BP (!) 154/100 (BP Location: Right Arm)   Pulse 88   Temp 99.4 F (37.4 C) (Oral)   Resp 16   SpO2 99%   Physical Exam  Constitutional: He appears well-developed and well-nourished. No  distress.  HENT:  Head: Normocephalic and atraumatic.  Eyes: Right eye exhibits no discharge. Left eye exhibits no discharge.  Pulmonary/Chest: Effort normal. No respiratory distress.  Musculoskeletal:  Tenderness to palpation over the lesion, distal pulses are 2+ with good capillary refill, sensation intact, normal range of motion and motor function of the right lower extremity  Neurological: He is alert. Coordination normal.  Skin: He is not diaphoretic.  There is a 2 cm blistered lesion, with dried center on the lateral aspect of the left mid calf, this lesion is erythematous and warm, but there is no fluctuance, there is an 8 cm area of erythema and induration extending  around the lesion, no other lesions noted elsewhere, see photo below  Psychiatric: He has a normal mood and affect. His behavior is normal.  Nursing note and vitals reviewed.        ED Treatments / Results  Labs (all labs ordered are listed, but only abnormal results are displayed) Labs Reviewed  AEROBIC CULTURE (SUPERFICIAL SPECIMEN)    EKG  EKG Interpretation None       Radiology No results found.  EMERGENCY DEPARTMENT US SOFT TISSUE INTERPRETATION "Study: Limited Soft Tissue Ultrasound"  INDICATIONS: Soft tissue infection Multiple views of the body part were obtained in real-time with a multi-frequency linear probe  PERFORMED BY: Myself IMAGES ARCHIVED?: Yes SIDE:Right  BODY PART:Lower extremity INTERPRETATION:  Abcess present and Cellulitis present   Procedures .Marland KitchenIncision and Drainage Date/Time: 02/19/2017 8:09 PM Performed by: Dartha Lodge, PA-C Authorized by: Dartha Lodge, PA-C   Consent:    Consent obtained:  Verbal   Consent given by:  Patient   Risks discussed:  Bleeding, incomplete drainage, infection and pain   Alternatives discussed:  No treatment Location:    Type:  Abscess   Size:  2 CM   Location:  Lower extremity   Lower extremity location:  Leg   Leg location:  R lower leg Pre-procedure details:    Skin preparation:  Chloraprep Anesthesia (see MAR for exact dosages):    Anesthesia method:  Local infiltration   Local anesthetic:  Lidocaine 2% WITH epi Procedure type:    Complexity:  Simple Procedure details:    Incision types:  Single straight   Incision depth:  Dermal   Scalpel blade:  11   Wound management:  Probed and deloculated and irrigated with saline   Drainage:  Bloody and purulent   Drainage amount:  Moderate   Wound treatment:  Wound left open   Packing materials:  None Post-procedure details:    Patient tolerance of procedure:  Tolerated well, no immediate complications   (including critical care  time)  Medications Ordered in ED Medications  lidocaine-EPINEPHrine (XYLOCAINE W/EPI) 2 %-1:200000 (PF) injection 20 mL (20 mLs Infiltration Given 02/19/17 1919)     Initial Impression / Assessment and Plan / ED Course  I have reviewed the triage vital signs and the nursing notes.  Pertinent labs & imaging results that were available during my care of the patient were reviewed by me and considered in my medical decision making (see chart for details).  Patient with skin abscess amenable to incision and drainage with surrounding cellulitis.  Abscess was not large enough to warrant packing or drain,  wound recheck in 2 days. Encouraged home warm soaks and flushing.  Patient will be discharged with doxycycline for surrounding cellulitis.  Will d/c to home.   Final Clinical Impressions(s) / ED Diagnoses   Final diagnoses:  Abscess  Cellulitis of right lower extremity    ED Discharge Orders        Ordered    doxycycline (VIBRAMYCIN) 100 MG capsule  2 times daily     02/19/17 2006       Dartha Lodge, [place] 02/20/17 0224    Tilden Fossa, MD 02/20/17 [phone]

## 2017-02-19 NOTE — ED Triage Notes (Signed)
PT reports he takes a weekly injection of Enbrel for his RA.

## 2017-02-19 NOTE — ED Notes (Signed)
ED Provider at bedside. 

## 2017-02-19 NOTE — Discharge Instructions (Signed)
Please complete full course of antibiotics as directed. Ibuprofen or Tylenol for pain. Please follow-up with your primary care doctor in the next few days to have the wound rechecked, it's very common to have some continued drainage over the next few days. If you have increasing redness, fevers or chills please return to the ED for sooner evaluation.

## 2017-02-19 NOTE — ED Notes (Signed)
Pt departed in NAD, refused use of wheelchair.  

## 2018-02-18 ENCOUNTER — Ambulatory Visit: Payer: Self-pay | Admitting: Surgery

## 2018-02-18 NOTE — H&P (Signed)
Mark [name] Documented: 02/18/2018 2:53 PM Location: [facility] Patient #: [id number] DOB: 09-06-1970 Single / Language: [place] / Race: Black or African American Male  History of Present Illness Mark Fus A. Fiorela Pelzer MD; 02/18/2018 4:04 PM) Patient words: Patient sent at the request of Mark Fallen PA for bilateral axillary swelling. He has a history of rheumatoid arthritis and takes M role for that with good control. He developed pain and swelling and drainage from both below. This is been going on and off for many years but has gotten worse recently. He denies fever or chills. He denies any significant drainage recently that the areas are more tender.  The patient is a 47 year old male.   Past Surgical History Mark [name], CMA; 02/18/2018 2:54 PM) No pertinent past surgical history  Diagnostic Studies History Mark [name], CMA; 02/18/2018 2:54 PM) Colonoscopy never  Allergies Mark [name], CMA; 02/18/2018 2:54 PM) No Known Drug Allergies [02/18/2018]: Allergies Reconciled  Medication History Mark [name], CMA; 02/18/2018 2:54 PM) Enbrel SureClick (50MG /ML Soln Auto-inj, Subcutaneous) Active. Medications Reconciled  Social History , CMA; 02/18/2018 2:54 PM) Caffeine use Carbonated beverages. No drug use  Family History 02/20/2018, Mark [name]; 02/18/2018 2:54 PM) First Degree Relatives No pertinent family history  Other Problems 02/20/2018, CMA; 02/18/2018 2:54 PM) Arthritis     Review of Systems 02/20/2018 CMA; 02/18/2018 2:54 PM) General Present- Appetite Loss, Fatigue and Weight Loss. Not Present- Chills, Fever, Night Sweats and Weight Gain. Skin Present- Change in Wart/Mole, New Lesions and Non-Healing Wounds. Not Present- Dryness, Hives, Jaundice, Rash and Ulcer. HEENT Present- Wears glasses/contact lenses. Not Present- Earache, Hearing Loss, Hoarseness, Nose Bleed, Oral Ulcers, Ringing in the Ears, Seasonal  Allergies, Sinus Pain, Sore Throat, Visual Disturbances and Yellow Eyes. Respiratory Not Present- Bloody sputum, Chronic Cough, Difficulty Breathing, Snoring and Wheezing. Breast Not Present- Breast Mass, Breast Pain, Nipple Discharge and Skin Changes. Cardiovascular Not Present- Chest Pain, Difficulty Breathing Lying Down, Leg Cramps, Palpitations, Rapid Heart Rate, Shortness of Breath and Swelling of Extremities. Gastrointestinal Not Present- Abdominal Pain, Bloating, Bloody Stool, Change in Bowel Habits, Chronic diarrhea, Constipation, Difficulty Swallowing, Excessive gas, Gets full quickly at meals, Hemorrhoids, Indigestion, Nausea, Rectal Pain and Vomiting. Male Genitourinary Not Present- Blood in Urine, Change in Urinary Stream, Frequency, Impotence, Nocturia, Painful Urination, Urgency and Urine Leakage. Musculoskeletal Present- Joint Pain and Swelling of Extremities. Not Present- Back Pain, Joint Stiffness, Muscle Pain and Muscle Weakness. Neurological Not Present- Decreased Memory, Fainting, Headaches, Numbness, Seizures, Tingling, Tremor, Trouble walking and Weakness. Psychiatric Not Present- Anxiety, Bipolar, Change in Sleep Pattern, Depression, Fearful and Frequent crying. Endocrine Not Present- Cold Intolerance, Excessive Hunger, Hair Changes, Heat Intolerance, Hot flashes and New Diabetes. Hematology Present- Persistent Infections. Not Present- Blood Thinners, Easy Bruising, Excessive bleeding, Gland problems and HIV.  Vitals 02/20/2018 CMA; 02/18/2018 2:55 PM) 02/18/2018 2:54 PM Weight: 172.25 lb Height: 70in Body Surface Area: 1.96 m Body Mass Index: 24.72 kg/m  Temp.: 99.22F(Oral)  Pulse: 95 (Regular)  BP: 132/84 (Sitting, Left Arm, Standard)      Physical Exam (Mark Griffie A. Yarden Hillis MD; 02/18/2018 4:05 PM)  Integumentary Note: Bilateral axillary hidradenitis without any acute infection. The areas measured 5 x 4 cm and are focal. No abscess.  Chest and  Lung Exam Chest and lung exam reveals -quiet, even and easy respiratory effort with no use of accessory muscles and on auscultation, normal breath sounds, no adventitious sounds and normal vocal resonance. Inspection Chest Wall - Normal. Back - normal.  Cardiovascular Cardiovascular examination reveals -on palpation PMI is normal in location and amplitude, no palpable S3 or S4. Normal cardiac borders., normal heart sounds, regular rate and rhythm with no murmurs, carotid auscultation reveals no bruits and normal pedal pulses bilaterally.  Neurologic Neurologic evaluation reveals -alert and oriented x 3 with no impairment of recent or remote memory. Mental Status-Normal.  Musculoskeletal Normal Exam - Left-Upper Extremity Strength Normal and Lower Extremity Strength Normal. Normal Exam - Right-Upper Extremity Strength Normal, Lower Extremity Weakness.    Assessment & Plan (Mark Muzquiz A. 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 Discussed potential complications especially given his immunocompromised state. He will need to hold his embrel for 1 week prior to surgery and until his wounds heal. Risk of wound complications in his case which could include poor wound healing, infection, the need for chronic long-term wound care and the need for revisional surgery. Other risks of surgery including bleeding, infection, loss of range of motion upper extremities requiring more surgery to correct that. I do not feel medical management would be helpful in this case. I refilled a prescription for doxycycline to take the time being I milligrams by mouth twice a day for the next 10 days and we'll send him for surgical scheduling.  Current Plans Pt Education - [facility] Hidradenitis Started Doxycycline Hyclate 100 MG Oral Capsule, 1 (one) Capsule two times daily, #20, 02/18/2018, No Refill. The anatomy and physiology of sweat glands and skin creases  was discussed. Pathophysiology of hidradenitis suppurativa was discussed. I stressed good hygiene, avoiding razors and avoiding antiperspirants. Consideration of excision with primary closure versus need to place a drain, open packing, possible skin flaps/grafts was discussed.  Possibility of recurrence was discussed. Risks, benefits, alternatives were discussed. I noted a good likelihood this will help address the problem. Risks of anesthesia and other risks discussed. Questions answered. The patient is considering surgery. They wish to proceed.

## 2018-03-30 ENCOUNTER — Emergency Department (HOSPITAL_COMMUNITY)
Admission: EM | Admit: 2018-03-30 | Discharge: 2018-03-30 | Disposition: A | Discharge status: Home / Self Care | Payer: BC Managed Care – PPO | Attending: Emergency Medicine | Admitting: Emergency Medicine

## 2018-03-30 ENCOUNTER — Other Ambulatory Visit: Payer: Self-pay

## 2018-03-30 DIAGNOSIS — K611 Rectal abscess: Secondary | ICD-10-CM

## 2018-03-30 MED ORDER — LIDOCAINE-EPINEPHRINE (PF) 2 %-1:200000 IJ SOLN
10.0000 mL | Freq: Once | INTRAMUSCULAR | Status: AC
  Administered 2018-03-30: 10 mL
  Filled 2018-03-30: qty 20

## 2018-03-30 MED ORDER — DOXYCYCLINE HYCLATE 100 MG PO CAPS: 100.0000 mg | ORAL_CAPSULE | Freq: Two times a day (BID) | ORAL | 0 refills | Status: DC

## 2018-03-30 NOTE — ED Triage Notes (Signed)
Pt endorses having an abscess to right lower back/buttocks that the pt thinks is a side effect to taking enbrel for his Rheumatoid arthritis. VSS.

## 2018-03-30 NOTE — ED Provider Notes (Signed)
[facility] EMERGENCY DEPARTMENT Provider Note   CSN: [id number] Arrival date & time: 03/30/18  1252     History   Chief Complaint Chief Complaint  Patient presents with  . Abscess    HPI Mark [name] is a 47 y.o. male.  The history is provided by the patient.  Abscess  Location:  Pelvis Pelvic abscess location:  Anus Size:  Right side of rectum and the size of a raquet ball Abscess quality: fluctuance, induration and painful   Abscess quality: not draining   Red streaking: no   Duration:  4 days Progression:  Worsening Pain details:    Quality:  Shooting and sharp   Severity:  Severe   Duration:  4 days   Timing:  Constant   Progression:  Worsening Chronicity:  New Context: immunosuppression   Context comment:  On embrel for RA Relieved by:  Nothing Worsened by:  Warm water soaks Ineffective treatments:  None tried Associated symptoms: no anorexia, no fatigue, no fever, no nausea and no vomiting   Risk factors: prior abscess     Past Medical History:  Diagnosis Date  . Hip fracture (HCC)   . Hypertension     There are no active problems to display for this patient.   No past surgical history on file.      Home Medications    Prior to Admission medications   Medication Sig Start Date End Date Taking? Authorizing Provider  cyclobenzaprine (FLEXERIL) 10 MG tablet Take 1 tablet (10 mg total) by mouth 2 (two) times daily as needed for muscle spasms. 07/12/13   Emilia Beck, PA-C  doxycycline (VIBRAMYCIN) 100 MG capsule Take 1 capsule (100 mg total) 2 (two) times daily by mouth. One po bid x 7 days 02/19/17   Dartha Lodge, PA-C  ibuprofen (ADVIL,MOTRIN) 200 MG tablet Take 600 mg by mouth every 6 (six) hours as needed for fever, headache or mild pain. For pain.    [provider]  naproxen (NAPROSYN) 500 MG tablet Take 1 tablet (500 mg total) by mouth 2 (two) times daily with a meal. 07/12/13   Szekalski, Kaitlyn, PA-C    naproxen (NAPROSYN) 500 MG tablet Take 1 tablet (500 mg total) by mouth 2 (two) times daily. 01/15/14   Kirichenko, Lemont Fillers, PA-C  traMADol (ULTRAM) 50 MG tablet Take 1 tablet (50 mg total) by mouth every 6 (six) hours as needed. 01/15/14   Jaynie Crumble, PA-C    Family History No family history on file.  Social History Social History   Tobacco Use  . Smoking status: Never Smoker  Substance Use Topics  . Alcohol use: Yes  . Drug use: No    Frequency: 3.0 times per week     Allergies   Patient has no known allergies.   Review of Systems Review of Systems  Constitutional: Negative for fatigue and fever.  Gastrointestinal: Negative for anorexia, nausea and vomiting.  All other systems reviewed and are negative.    Physical Exam Updated Vital Signs BP 137/72 (BP Location: Left Arm)   Pulse 96   Temp 98.4 F (36.9 C) (Oral)   Resp 20   SpO2 100%   Physical Exam Vitals signs and nursing note reviewed.  Constitutional:      General: He is not in acute distress.    Appearance: Normal appearance.  HENT:     Head: Normocephalic.  Eyes:     Pupils: Pupils are equal, round, and reactive to light.  Cardiovascular:  Rate and Rhythm: Normal rate.     Pulses: Normal pulses.  Pulmonary:     Effort: Pulmonary effort is normal.  Abdominal:     General: There is no distension.     Palpations: Abdomen is soft.     Tenderness: There is no abdominal tenderness. There is no guarding.  Genitourinary:   Skin:    General: Skin is warm.     Capillary Refill: Capillary refill takes less than 2 seconds.  Neurological:     General: No focal deficit present.     Mental Status: He is alert. Mental status is at baseline.  Psychiatric:        Mood and Affect: Mood normal.      ED Treatments / Results  Labs (all labs ordered are listed, but only abnormal results are displayed) Labs Reviewed - No data to display  EKG None  Radiology No results  found.  Procedures Procedures (including critical care time) INCISION AND DRAINAGE Performed by: Gwyneth Sprout Consent: Verbal consent obtained. Risks and benefits: risks, benefits and alternatives were discussed Type: abscess  Body area: right perirectal area  Anesthesia: local infiltration  Incision was made with a scalpel.  Local anesthetic: lidocaine 2% with epinephrine  Anesthetic total: 6 ml  Complexity: complex Blunt dissection to break up loculations  Drainage: purulent  Drainage amount: 74mL  Packing material: 1/4 in iodoform gauze  Patient tolerance: Patient tolerated the procedure well with no immediate complications.     Medications Ordered in ED Medications  lidocaine-EPINEPHrine (XYLOCAINE W/EPI) 2 %-1:200000 (PF) injection 10 mL (has no administration in time range)     Initial Impression / Assessment and Plan / ED Course  I have reviewed the triage vital signs and the nursing notes.  Pertinent labs & imaging results that were available during my care of the patient were reviewed by me and considered in my medical decision making (see chart for details).     Patient presenting today with evidence of a perirectal abscess.  Good visibility with pointing and fluctuance.  No scrotal involvement.  Patient denies any systemic illness.  He does take Enbrel for his rheumatoid arthritis.  I&D as above.  Patient will be started on doxycycline.  Final Clinical Impressions(s) / ED Diagnoses   Final diagnoses:  Perirectal abscess    ED Discharge Orders         Ordered    doxycycline (VIBRAMYCIN) 100 MG capsule  2 times daily     03/30/18 1413           Gwyneth Sprout, MD 03/30/18 1413

## 2018-03-30 NOTE — ED Notes (Signed)
Pt discharged from ED; instructions provided and scripts given; Pt encouraged to return to ED if symptoms worsen and to f/u with PCP; Pt verbalized understanding of all instructions 

## 2018-03-30 NOTE — Discharge Instructions (Signed)
Sure you are soaking for 20 minutes in the tub with Epson salts.  If the packing is still present in 3 days remove it.  Make sure you return if you are running fevers, areas getting more swollen or more tender or seems to be worsening.

## 2018-07-17 ENCOUNTER — Other Ambulatory Visit: Payer: Self-pay

## 2018-07-17 ENCOUNTER — Inpatient Hospital Stay (HOSPITAL_COMMUNITY): Payer: BC Managed Care – PPO | Admitting: Registered Nurse

## 2018-07-17 ENCOUNTER — Emergency Department (HOSPITAL_COMMUNITY): Payer: BC Managed Care – PPO

## 2018-07-17 ENCOUNTER — Encounter (HOSPITAL_COMMUNITY): Admission: EM | Disposition: A | Discharge status: Home / Self Care | Payer: Self-pay | Source: Home / Self Care

## 2018-07-17 ENCOUNTER — Encounter (HOSPITAL_COMMUNITY): Payer: Self-pay | Admitting: Emergency Medicine

## 2018-07-17 ENCOUNTER — Inpatient Hospital Stay (HOSPITAL_COMMUNITY)
Admission: EM | Admit: 2018-07-17 | Discharge: 2018-07-20 | DRG: 345 | Disposition: A | Discharge status: Home / Self Care | Payer: BC Managed Care – PPO | Attending: Surgery | Admitting: Surgery

## 2018-07-17 DIAGNOSIS — Z79899 Other long term (current) drug therapy: Secondary | ICD-10-CM

## 2018-07-17 DIAGNOSIS — I10 Essential (primary) hypertension: Secondary | ICD-10-CM | POA: Diagnosis present

## 2018-07-17 DIAGNOSIS — L0231 Cutaneous abscess of buttock: Secondary | ICD-10-CM | POA: Diagnosis present

## 2018-07-17 DIAGNOSIS — Z79891 Long term (current) use of opiate analgesic: Secondary | ICD-10-CM | POA: Diagnosis not present

## 2018-07-17 DIAGNOSIS — M069 Rheumatoid arthritis, unspecified: Secondary | ICD-10-CM | POA: Diagnosis present

## 2018-07-17 DIAGNOSIS — K611 Rectal abscess: Secondary | ICD-10-CM | POA: Diagnosis present

## 2018-07-17 DIAGNOSIS — K612 Anorectal abscess: Secondary | ICD-10-CM | POA: Diagnosis present

## 2018-07-17 DIAGNOSIS — Z791 Long term (current) use of non-steroidal anti-inflammatories (NSAID): Secondary | ICD-10-CM

## 2018-07-17 HISTORY — PX: INCISION AND DRAINAGE PERIRECTAL ABSCESS: SHX1804

## 2018-07-17 LAB — BASIC METABOLIC PANEL
Anion gap: 9 (ref 5–15)
BUN: 13 mg/dL (ref 6–20)
CO2: 25 mmol/L (ref 22–32)
Calcium: 9.2 mg/dL (ref 8.9–10.3)
Chloride: 104 mmol/L (ref 98–111)
Creatinine, Ser: 0.92 mg/dL (ref 0.61–1.24)
GFR calc Af Amer: >60 mL/min (ref >60)
GFR calc non Af Amer: >60 mL/min (ref >60)
Glucose, Bld: 90 mg/dL (ref 70–99)
Potassium: 3.8 mmol/L (ref 3.5–5.1)
Sodium: 138 mmol/L (ref 135–145)

## 2018-07-17 LAB — URINALYSIS, ROUTINE W REFLEX MICROSCOPIC
Bilirubin Urine: NEGATIVE
Glucose, UA: NEGATIVE mg/dL
Hgb urine dipstick: NEGATIVE
Ketones, ur: NEGATIVE mg/dL
Nitrite: NEGATIVE
Protein, ur: 30 mg/dL — AB
Specific Gravity, Urine: 1.025 (ref 1.005–1.030)
pH: 6 (ref 5.0–8.0)

## 2018-07-17 LAB — CBC WITH DIFFERENTIAL/PLATELET
Abs Immature Granulocytes: 0.09 10*3/uL — ABNORMAL HIGH (ref 0.00–0.07)
Basophils Absolute: 0.1 10*3/uL (ref 0.0–0.1)
Basophils Relative: 0 %
Eosinophils Absolute: 0.3 10*3/uL (ref 0.0–0.5)
Eosinophils Relative: 1 %
HCT: 37.8 % — ABNORMAL LOW (ref 39.0–52.0)
Hemoglobin: 12 g/dL — ABNORMAL LOW (ref 13.0–17.0)
Immature Granulocytes: 0 %
Lymphocytes Relative: 11 %
Lymphs Abs: 2.3 10*3/uL (ref 0.7–4.0)
MCH: 28.7 pg (ref 26.0–34.0)
MCHC: 31.7 g/dL (ref 30.0–36.0)
MCV: 90.4 fL (ref 80.0–100.0)
Monocytes Absolute: 2.1 10*3/uL — ABNORMAL HIGH (ref 0.1–1.0)
Monocytes Relative: 10 %
Neutro Abs: 16 10*3/uL — ABNORMAL HIGH (ref 1.7–7.7)
Neutrophils Relative %: 78 %
Platelets: 378 10*3/uL (ref 150–400)
RBC: 4.18 MIL/uL — ABNORMAL LOW (ref 4.22–5.81)
RDW: 14.5 % (ref 11.5–15.5)
WBC: 20.8 10*3/uL — ABNORMAL HIGH (ref 4.0–10.5)
nRBC: 0 % (ref 0.0–0.2)

## 2018-07-17 LAB — URINALYSIS, MICROSCOPIC (REFLEX)
RBC / HPF: NONE SEEN RBC/hpf (ref 0–5)
Squamous Epithelial / LPF: NONE SEEN (ref 0–5)

## 2018-07-17 SURGERY — INCISION AND DRAINAGE, ABSCESS, PERIRECTAL
Anesthesia: General | Site: Rectum

## 2018-07-17 MED ORDER — DIPHENHYDRAMINE HCL 50 MG/ML IJ SOLN: 25.0000 mg | Freq: Four times a day (QID) | INTRAMUSCULAR | Status: DC | PRN

## 2018-07-17 MED ORDER — LACTATED RINGERS IV SOLN
INTRAVENOUS | Status: DC
  Administered 2018-07-17 (×2): via INTRAVENOUS

## 2018-07-17 MED ORDER — SUCCINYLCHOLINE CHLORIDE 20 MG/ML IJ SOLN
INTRAMUSCULAR | Status: DC | PRN
  Administered 2018-07-17: 120 mg via INTRAVENOUS

## 2018-07-17 MED ORDER — DOCUSATE SODIUM 100 MG PO CAPS
100.0000 mg | ORAL_CAPSULE | Freq: Two times a day (BID) | ORAL | Status: DC
  Administered 2018-07-17 – 2018-07-20 (×5): 100 mg via ORAL
  Filled 2018-07-17 (×6): qty 1

## 2018-07-17 MED ORDER — MIDAZOLAM HCL 5 MG/5ML IJ SOLN
INTRAMUSCULAR | Status: DC | PRN
  Administered 2018-07-17: 2 mg via INTRAVENOUS

## 2018-07-17 MED ORDER — PROPOFOL 10 MG/ML IV BOLUS
INTRAVENOUS | Status: DC | PRN
  Administered 2018-07-17: 50 mg via INTRAVENOUS
  Administered 2018-07-17: 200 mg via INTRAVENOUS

## 2018-07-17 MED ORDER — METHOCARBAMOL 500 MG PO TABS
500.0000 mg | ORAL_TABLET | Freq: Four times a day (QID) | ORAL | Status: DC | PRN
  Administered 2018-07-18: 500 mg via ORAL
  Filled 2018-07-17: qty 1

## 2018-07-17 MED ORDER — ACETAMINOPHEN 325 MG PO TABS
650.0000 mg | ORAL_TABLET | Freq: Four times a day (QID) | ORAL | Status: DC | PRN
  Administered 2018-07-17 – 2018-07-20 (×2): 650 mg via ORAL
  Filled 2018-07-17 (×2): qty 2

## 2018-07-17 MED ORDER — PIPERACILLIN-TAZOBACTAM 3.375 G IVPB 30 MIN
3.3750 g | INTRAVENOUS | Status: AC
  Administered 2018-07-17: 3.375 g via INTRAVENOUS
  Filled 2018-07-17: qty 50

## 2018-07-17 MED ORDER — SODIUM CHLORIDE 0.9 % IV SOLN
INTRAVENOUS | Status: DC
  Administered 2018-07-17 – 2018-07-18 (×3): via INTRAVENOUS

## 2018-07-17 MED ORDER — MORPHINE SULFATE (PF) 4 MG/ML IV SOLN
4.0000 mg | Freq: Once | INTRAVENOUS | Status: AC
  Administered 2018-07-17: 4 mg via INTRAVENOUS
  Filled 2018-07-17: qty 1

## 2018-07-17 MED ORDER — PROMETHAZINE HCL 25 MG/ML IJ SOLN: 6.2500 mg | INTRAMUSCULAR | Status: DC | PRN

## 2018-07-17 MED ORDER — CHLORHEXIDINE GLUCONATE CLOTH 2 % EX PADS: 6.0000 | MEDICATED_PAD | Freq: Once | CUTANEOUS | Status: DC

## 2018-07-17 MED ORDER — ACETAMINOPHEN 650 MG RE SUPP: 650.0000 mg | Freq: Four times a day (QID) | RECTAL | Status: DC | PRN

## 2018-07-17 MED ORDER — DEXAMETHASONE SODIUM PHOSPHATE 10 MG/ML IJ SOLN
INTRAMUSCULAR | Status: DC | PRN
  Administered 2018-07-17: 5 mg via INTRAVENOUS

## 2018-07-17 MED ORDER — ONDANSETRON 4 MG PO TBDP: 4.0000 mg | ORAL_TABLET | Freq: Four times a day (QID) | ORAL | Status: DC | PRN

## 2018-07-17 MED ORDER — PROPOFOL 10 MG/ML IV BOLUS
INTRAVENOUS | Status: AC
  Filled 2018-07-17: qty 20

## 2018-07-17 MED ORDER — 0.9 % SODIUM CHLORIDE (POUR BTL) OPTIME
TOPICAL | Status: DC | PRN
  Administered 2018-07-17: 15:00:00 1000 mL

## 2018-07-17 MED ORDER — ENOXAPARIN SODIUM 40 MG/0.4ML ~~LOC~~ SOLN
40.0000 mg | SUBCUTANEOUS | Status: DC
  Filled 2018-07-17: qty 0.4

## 2018-07-17 MED ORDER — PIPERACILLIN-TAZOBACTAM 3.375 G IVPB
3.3750 g | Freq: Three times a day (TID) | INTRAVENOUS | Status: DC
  Administered 2018-07-17 – 2018-07-20 (×8): 3.375 g via INTRAVENOUS
  Filled 2018-07-17 (×7): qty 50

## 2018-07-17 MED ORDER — FENTANYL CITRATE (PF) 100 MCG/2ML IJ SOLN
INTRAMUSCULAR | Status: DC | PRN
  Administered 2018-07-17 (×2): 100 ug via INTRAVENOUS
  Administered 2018-07-17: 50 ug via INTRAVENOUS

## 2018-07-17 MED ORDER — LIDOCAINE 2% (20 MG/ML) 5 ML SYRINGE
INTRAMUSCULAR | Status: DC | PRN
  Administered 2018-07-17: 60 mg via INTRAVENOUS

## 2018-07-17 MED ORDER — OXYCODONE HCL 5 MG PO TABS
5.0000 mg | ORAL_TABLET | ORAL | Status: DC | PRN
  Administered 2018-07-18 – 2018-07-20 (×4): 10 mg via ORAL
  Filled 2018-07-17: qty 1
  Filled 2018-07-17 (×4): qty 2

## 2018-07-17 MED ORDER — DIPHENHYDRAMINE HCL 25 MG PO CAPS: 25.0000 mg | ORAL_CAPSULE | Freq: Four times a day (QID) | ORAL | Status: DC | PRN

## 2018-07-17 MED ORDER — ONDANSETRON HCL 4 MG/2ML IJ SOLN
INTRAMUSCULAR | Status: DC | PRN
  Administered 2018-07-17: 4 mg via INTRAVENOUS

## 2018-07-17 MED ORDER — ONDANSETRON HCL 4 MG/2ML IJ SOLN: 4.0000 mg | Freq: Four times a day (QID) | INTRAMUSCULAR | Status: DC | PRN

## 2018-07-17 MED ORDER — ENOXAPARIN SODIUM 40 MG/0.4ML ~~LOC~~ SOLN
40.0000 mg | SUBCUTANEOUS | Status: DC
  Administered 2018-07-17 – 2018-07-19 (×3): 40 mg via SUBCUTANEOUS
  Filled 2018-07-17 (×3): qty 0.4

## 2018-07-17 MED ORDER — SODIUM CHLORIDE 0.9 % IV BOLUS
1000.0000 mL | Freq: Once | INTRAVENOUS | Status: AC
  Administered 2018-07-17: 1000 mL via INTRAVENOUS

## 2018-07-17 MED ORDER — HYDRALAZINE HCL 20 MG/ML IJ SOLN: 10.0000 mg | INTRAMUSCULAR | Status: DC | PRN

## 2018-07-17 MED ORDER — POLYETHYLENE GLYCOL 3350 17 G PO PACK: 17.0000 g | PACK | Freq: Every day | ORAL | Status: DC | PRN

## 2018-07-17 MED ORDER — FENTANYL CITRATE (PF) 250 MCG/5ML IJ SOLN
INTRAMUSCULAR | Status: AC
  Filled 2018-07-17: qty 5

## 2018-07-17 MED ORDER — MIDAZOLAM HCL 2 MG/2ML IJ SOLN
INTRAMUSCULAR | Status: AC
  Filled 2018-07-17: qty 2

## 2018-07-17 MED ORDER — IOPAMIDOL (ISOVUE-300) INJECTION 61%
100.0000 mL | Freq: Once | INTRAVENOUS | Status: AC | PRN
  Administered 2018-07-17: 100 mL via INTRAVENOUS

## 2018-07-17 MED ORDER — PANTOPRAZOLE SODIUM 40 MG IV SOLR
40.0000 mg | Freq: Every day | INTRAVENOUS | Status: DC
  Administered 2018-07-17 – 2018-07-18 (×2): 40 mg via INTRAVENOUS
  Filled 2018-07-17 (×2): qty 40

## 2018-07-17 MED ORDER — MORPHINE SULFATE (PF) 2 MG/ML IV SOLN: 2.0000 mg | INTRAVENOUS | Status: DC | PRN

## 2018-07-17 MED ORDER — FENTANYL CITRATE (PF) 100 MCG/2ML IJ SOLN: 25.0000 ug | INTRAMUSCULAR | Status: DC | PRN

## 2018-07-17 SURGICAL SUPPLY — 30 items
CANISTER SUCT 3000ML PPV (MISCELLANEOUS) ×2 IMPLANT
COVER SURGICAL LIGHT HANDLE (MISCELLANEOUS) ×2 IMPLANT
COVER WAND RF STERILE (DRAPES) ×2 IMPLANT
DRAPE UTILITY XL STRL (DRAPES) ×2 IMPLANT
DRSG PAD ABDOMINAL 8X10 ST (GAUZE/BANDAGES/DRESSINGS) ×2 IMPLANT
ELECT REM PT RETURN 9FT ADLT (ELECTROSURGICAL) ×2
ELECTRODE REM PT RTRN 9FT ADLT (ELECTROSURGICAL) ×1 IMPLANT
GAUZE PACKING IODOFORM 1/2 (PACKING) ×2 IMPLANT
GAUZE SPONGE 4X4 12PLY STRL (GAUZE/BANDAGES/DRESSINGS) ×2 IMPLANT
GLOVE BIO SURGEON STRL SZ7.5 (GLOVE) ×2 IMPLANT
GLOVE BIOGEL PI IND STRL 8 (GLOVE) ×1 IMPLANT
GLOVE BIOGEL PI INDICATOR 8 (GLOVE) ×1
GOWN STRL REUS W/ TWL LRG LVL3 (GOWN DISPOSABLE) ×1 IMPLANT
GOWN STRL REUS W/ TWL XL LVL3 (GOWN DISPOSABLE) ×1 IMPLANT
GOWN STRL REUS W/TWL LRG LVL3 (GOWN DISPOSABLE) ×1
GOWN STRL REUS W/TWL XL LVL3 (GOWN DISPOSABLE) ×1
KIT BASIN OR (CUSTOM PROCEDURE TRAY) ×2 IMPLANT
KIT TURNOVER KIT B (KITS) ×2 IMPLANT
NS IRRIG 1000ML POUR BTL (IV SOLUTION) ×2 IMPLANT
PACK LITHOTOMY IV (CUSTOM PROCEDURE TRAY) ×2 IMPLANT
PAD ARMBOARD 7.5X6 YLW CONV (MISCELLANEOUS) ×2 IMPLANT
PENCIL SMOKE EVACUATOR (MISCELLANEOUS) ×2 IMPLANT
SPONGE LAP 18X18 RF (DISPOSABLE) ×2 IMPLANT
SWAB COLLECTION DEVICE MRSA (MISCELLANEOUS) IMPLANT
SWAB CULTURE ESWAB REG 1ML (MISCELLANEOUS) IMPLANT
TOWEL OR 17X24 6PK STRL BLUE (TOWEL DISPOSABLE) ×2 IMPLANT
TOWEL OR 17X26 10 PK STRL BLUE (TOWEL DISPOSABLE) ×2 IMPLANT
TUBE CONNECTING 12X1/4 (SUCTIONS) ×2 IMPLANT
UNDERPAD 30X30 (UNDERPADS AND DIAPERS) ×2 IMPLANT
YANKAUER SUCT BULB TIP NO VENT (SUCTIONS) ×2 IMPLANT

## 2018-07-17 NOTE — ED Provider Notes (Addendum)
[facility]   CSN: [id number] Arrival date & time: 07/17/18  0825    History   Chief Complaint Chief Complaint  Patient presents with  . Abscess    HPI Mark [name] is a 48 y.o. male with PMHx RA who presents to the ED complaining of an area of redness, pain, and swelling to the right buttock x 4 days. Pt has hx of perirectal abscess, drained in the ED in December 2019 and reports this feels similar. The area has grown in size since onset and is now extending into the perineum. Pt has been doing sitz baths without relief. He has not had a bowel movement in 2 days which is abnormal for him; typically goes every day. Unable to report if he is having pain with defecation. Denies fever, chills, drainage, nausea, vomiting, abdominal pain, rectal bleeding.        Past Medical History:  Diagnosis Date  . Hip fracture (HCC)   . Hypertension     Patient Active Problem List   Diagnosis Date Noted  . Perirectal abscess 07/17/2018    Past Surgical History:  Procedure Laterality Date  . INCISION AND DRAINAGE PERIRECTAL ABSCESS N/A 07/17/2018   Procedure: IRRIGATION AND DEBRIDEMENT PERIRECTAL ABSCESS;  Surgeon: Axel Filleramirez, Armando, MD;  [facility];  Service: General;  Laterality: N/A;        Home Medications    Prior to Admission medications   Medication Sig Start Date End Date Taking? Authorizing Provider  ENBREL SURECLICK 50 MG/ML injection Inject 50 mg into the skin every Saturday.  06/15/18  Yes [provider]  ibuprofen (ADVIL,MOTRIN) 200 MG tablet Take 600 mg by mouth every 6 (six) hours as needed for fever, headache or mild pain. For pain.   Yes [provider]  oxyCODONE (OXY IR/ROXICODONE) 5 MG immediate release tablet Take 1 tablet (5 mg total) by mouth every 6 (six) hours as needed for severe pain. 07/20/18   Luretha MurphyMartin, Matthew, MD    Family History History reviewed. No pertinent family history.  Social History Social History   Tobacco Use  . Smoking status: Never Smoker  . Smokeless tobacco: Never Used  Substance Use Topics  . Alcohol use: Yes  . Drug use: No    Frequency: 3.0 times per week     Allergies   Peanut-containing drug products   Review of Systems Review of Systems  Constitutional: Negative for chills and fever.  HENT: Negative for congestion.   Eyes: Negative for redness.  Respiratory: Negative for shortness of breath.   Cardiovascular: Negative for chest pain.  Gastrointestinal: Positive for constipation. Negative for abdominal pain, anal bleeding, blood in stool, nausea and vomiting.  Genitourinary: Negative for discharge, dysuria, frequency, penile pain, penile swelling, scrotal swelling and testicular pain.  Musculoskeletal: Negative for back pain.  Skin: Positive for color change.       + right sided perirectal abscess  Hematological: Does not bruise/bleed easily.     Physical Exam Updated Vital Signs BP (!) 152/84 (BP Location: Left Arm)   Pulse (!) 105   Temp 98 F (36.7 C) (Oral)   Resp 17   Ht 5\' 10"  (1.778 m)   Wt 77.1 kg   SpO2 99%   BMI 24.39 kg/m   Physical Exam Vitals signs and nursing note reviewed. Exam conducted with a chaperone present.  Constitutional:      Appearance: He is not ill-appearing.  HENT:     Head: Normocephalic  and atraumatic.  Eyes:     Conjunctiva/sclera: Conjunctivae normal.  Neck:     Musculoskeletal: Neck supple.  Cardiovascular:     Rate and Rhythm: Regular rhythm. Tachycardia present.     Pulses: Normal pulses.  Pulmonary:     Effort: Pulmonary effort is normal.     Breath sounds: Normal breath sounds. No wheezing, rhonchi or rales.  Abdominal:     Palpations: Abdomen is soft.     Tenderness: There is no abdominal tenderness. There is no guarding or rebound.  Genitourinary:    Prostate: Normal.     Rectum: No tenderness.    Skin:    General: Skin is warm and dry.  Neurological:      Mental Status: He is alert.      ED Treatments / Results  Labs (all labs ordered are listed, but only abnormal results are displayed) Labs Reviewed  CBC WITH DIFFERENTIAL/PLATELET - Abnormal; Notable for the following components:      Result Value   WBC 20.8 (*)    RBC 4.18 (*)    Hemoglobin 12.0 (*)    HCT 37.8 (*)    Neutro Abs 16.0 (*)    Monocytes Absolute 2.1 (*)    Abs Immature Granulocytes 0.09 (*)    All other components within normal limits  URINALYSIS, ROUTINE W REFLEX MICROSCOPIC - Abnormal; Notable for the following components:   APPearance HAZY (*)    Protein, ur 30 (*)    Leukocytes,Ua TRACE (*)    All other components within normal limits  URINALYSIS, MICROSCOPIC (REFLEX) - Abnormal; Notable for the following components:   Bacteria, UA RARE (*)    All other components within normal limits  BASIC METABOLIC PANEL - Abnormal; Notable for the following components:   Glucose, Bld 123 (*)    All other components within normal limits  CBC - Abnormal; Notable for the following components:   WBC 20.0 (*)    RBC 4.11 (*)    Hemoglobin 11.8 (*)    HCT 37.5 (*)    Platelets 420 (*)    All other components within normal limits  CBC - Abnormal; Notable for the following components:   WBC 12.3 (*)    RBC 3.99 (*)    Hemoglobin 11.8 (*)    HCT 36.0 (*)    Platelets 452 (*)    All other components within normal limits  AEROBIC/ANAEROBIC CULTURE (SURGICAL/DEEP WOUND)  BASIC METABOLIC PANEL  HIV ANTIBODY (ROUTINE TESTING W REFLEX)    EKG Normal sinus rhythm Moderate voltage criteria for LVH, may be normal variant Borderline ECG Since last tracing HEART RATE has decreased Confirmed by End, Cristal Deerhristopher [phone]([id number]) on 07/17/2018 4:54:03 PM  Radiology Ct Pelvis W Contrast  Result Date: 07/17/2018 CLINICAL DATA:  Perianal and buttock abscess. EXAM: CT PELVIS WITH CONTRAST TECHNIQUE: Multidetector CT imaging of the pelvis was performed using the standard protocol  following the bolus administration of intravenous contrast. CONTRAST:  100mL ISOVUE-300 IOPAMIDOL (ISOVUE-300) INJECTION 61% COMPARISON:  None. FINDINGS: Urinary Tract: Unremarkable urinary bladder. Simple appearing cyst in the lower pole of the left kidney is incompletely visualized on this study. Bowel: Moderate wall thickening is seen involving the terminal ileum, suspicious for Crohn's disease. A large complex right perianal abscess is seen in the medial right buttock which measures 11.3 x 2.4 cm. Vascular/Lymphatic: No pathologically enlarged lymph nodes or other significant abnormality. Reproductive:  No mass or other significant abnormality. Other: None. Musculoskeletal: Symmetric erosive changes and surrounding sclerosis involving  both sacroiliac joints, consistent with sacroiliitis. IMPRESSION: 1. Large complex right perianal and buttock abscess. 2. Moderate wall thickening involving terminal ileum, suspicious for Crohn's disease. 3. 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 Electronically Signed   By: Myles Rosenthal M.D.   On: 07/17/2018 13:11    Procedures Procedures (including critical care time)  Medications Ordered in ED Medications  morphine 4 MG/ML injection 4 mg (4 mg Intravenous Given 07/17/18 0929)  sodium chloride 0.9 % bolus 1,000 mL (0 mLs Intravenous Stopped 07/17/18 1355)  morphine 4 MG/ML injection 4 mg (4 mg Intravenous Given 07/17/18 1250)  iopamidol (ISOVUE-300) 61 % injection 100 mL (100 mLs Intravenous Contrast Given 07/17/18 1249)  piperacillin-tazobactam (ZOSYN) IVPB 3.375 g (3.375 g Intravenous Given 07/17/18 1512)     Initial Impression / Assessment and Plan / ED Course  I have reviewed the triage vital signs and the nursing notes.  Pertinent labs & imaging results that were available during my care of the patient were reviewed by me and considered in my medical decision making (see chart for details).    Pt is  a 48 year old male who presents with 8 cm perirectal abscess on right extending into right scrotum; does not appear to extend into the rectum with DRE. No systemic symptoms. Pt afebrile in the ED. Mildly tachycardic and hypertensive but has improved during visit; likely pain reaction. Discussed case with attending physician Dr. Pilar Plate who suggest CT Pelvis and consult with general surgery after imaging returns given size and proximity to rectum. Will get baseline labs and pain medication ordered prior to scan.   Nursing staff informed that she has called both lab and facilities trying to locate pt's bloodwork as it has taken over an hour to process/pt cannot go to CT until it has resulted. Unable to locate bloodwork; will redraw labs.   CBC with leukocytosis of 20.8; still awaiting BMP. As pt has white count and is tachycardic will give 1 L NS.   CT scan with 11.3 x 2.4 cm perianal abscess; general surgery consulted, Mark [name] will come and evaluate patient.   General surgeon Dr. Derrell Lolling will take pt to OR for drainage of perirectal abscess.        Final Clinical Impressions(s) / ED Diagnoses   Final diagnoses:  Perirectal abscess    ED Discharge Orders         Ordered    Diet - low sodium heart healthy     07/20/18 0814    Increase activity slowly     07/20/18 0814    Discharge instructions    Comments:  Daily repack perirectal wound with enough gauze to keep the wound open without excessive packing   07/20/18 0814    Discharge wound care:    Comments:  Should time packing changes with showers/baths--remove packing and the shower/bath and then repack   07/20/18 0814    Call MD for:  temperature >100.4     07/20/18 0814    Call MD for:  redness, tenderness, or signs of infection (pain, swelling, redness, odor or green/yellow discharge around incision site)     07/20/18 0814    oxyCODONE (OXY IR/ROXICODONE) 5 MG immediate release tablet  Every 6 hours PRN     07/20/18 0814            Tanda Rockers, PA-C 07/17/18 1447    Sabas Sous, MD 07/17/18 1534    Tanda Rockers, PA-C  07/29/18 1549    Sabas Sous, MD 07/30/18 1108

## 2018-07-17 NOTE — Anesthesia Preprocedure Evaluation (Signed)
Anesthesia Evaluation  Patient identified by MRN, date of birth, ID band Patient awake    Reviewed: Allergy & Precautions, NPO status , Patient's Chart, lab work & pertinent test results  Airway Mallampati: II  TM Distance: >3 FB Neck ROM: Full    Dental  (+) Dental Advisory Given   Pulmonary neg pulmonary ROS,    breath sounds clear to auscultation       Cardiovascular hypertension,  Rhythm:Regular Rate:Normal     Neuro/Psych negative neurological ROS     GI/Hepatic negative GI ROS, Neg liver ROS, Perirectal abscess   Endo/Other  negative endocrine ROS  Renal/GU negative Renal ROS     Musculoskeletal   Abdominal   Peds  Hematology negative hematology ROS (+)   Anesthesia Other Findings   Reproductive/Obstetrics                             Lab Results  Component Value Date   WBC 20.8 (H) 07/17/2018   HGB 12.0 (L) 07/17/2018   HCT 37.8 (L) 07/17/2018   MCV 90.4 07/17/2018   PLT 378 07/17/2018   Lab Results  Component Value Date   CREATININE 0.92 07/17/2018   BUN 13 07/17/2018   NA 138 07/17/2018   K 3.8 07/17/2018   CL 104 07/17/2018   CO2 25 07/17/2018    Anesthesia Physical Anesthesia Plan  ASA: II and emergent  Anesthesia Plan: General   Post-op Pain Management:    Induction: Intravenous and Rapid sequence  PONV Risk Score and Plan: 2 and Ondansetron, Dexamethasone and Treatment may vary due to age or medical condition  Airway Management Planned: Oral ETT  Additional Equipment:   Intra-op Plan:   Post-operative Plan: Extubation in OR  Informed Consent: I have reviewed the patients History and Physical, chart, labs and discussed the procedure including the risks, benefits and alternatives for the proposed anesthesia with the patient or authorized representative who has indicated his/her understanding and acceptance.     Dental advisory given  Plan  Discussed with: CRNA  Anesthesia Plan Comments:         Anesthesia Quick Evaluation

## 2018-07-17 NOTE — Op Note (Signed)
07/17/2018  3:36 PM  PATIENT:  Mark [name]  48 y.o. male  PRE-OPERATIVE DIAGNOSIS:  perirectal abscess  POST-OPERATIVE DIAGNOSIS:  perirectal abscess  PROCEDURE:  Procedure(s): IRRIGATION AND DEBRIDEMENT PERIRECTAL ABSCESS (N/A)  SURGEON:  Surgeon(s) and Role:    Axel Filler, MD - Primary  ANESTHESIA:   local and general  EBL:  25 mL   BLOOD ADMINISTERED:none  DRAINS: 1" iodoform guaze  LOCAL MEDICATIONS USED:  none  SPECIMEN:  Source of Specimen:  Perirectal abscess  DISPOSITION OF SPECIMEN:  Microbiology  COUNTS:  YES  TOURNIQUET:  * No tourniquets in log *  DICTATION: .Dragon Dictation Patient is a 48 year old male with a right perirectal abscess.  Pt had a large increasing abscess that failed to improve with abx.  Pt with CT = large perirectal abscess.  Pt was taken back urgently to OR for I&D.  Details of procedure: The patient was consented he was taken back to the OR and placed in supine position with bilateral SCDs in place.  He underwent general endotracheal intubation.  Patient is then placed in the lithotomy position.  Patient was prepped and draped in standard fashion.  Timeout was called all facts verified.  The area of induration was incised.  There was a large amount of purulence that was expressed.  Cultures were obtained.    The area tracked anteriorly toward the perineum and base of the scrotum on the right side.  All loculations were broken up. The incision was extended toward the area of infection.  The area was irrigated out with sterile saline.  Hemostasis was achieved with cautery.  The area was packed with 1" iodoform guaze.  This was dressed with 4 x 4's, ABD pad, and mesh panties.  The patient tolerated the procedure well was taken to the recovery in stable condition   PLAN OF CARE: Admit to inpatient   PATIENT DISPOSITION:  PACU - hemodynamically stable.   Delay start of Pharmacological VTE agent (>24hrs) due to surgical blood  loss or risk of bleeding: yes

## 2018-07-17 NOTE — Progress Notes (Signed)
Pt has low grade fever, 99.9, gave tylenol at 1851. Will continue to monitor and retake temp. Will pass on to night shift nurse

## 2018-07-17 NOTE — ED Notes (Signed)
Notified CT, pt is ready

## 2018-07-17 NOTE — ED Triage Notes (Signed)
Pt. Stated, I have a boil on my back side. Started 4 days ago

## 2018-07-17 NOTE — Transfer of Care (Signed)
Immediate Anesthesia Transfer of Care Note  Patient: Mark [name]  Procedure(s) Performed: IRRIGATION AND DEBRIDEMENT PERIRECTAL ABSCESS (N/A Rectum)  Patient Location: PACU  Anesthesia Type:General  Level of Consciousness: drowsy  Airway & Oxygen Therapy: Patient Spontanous Breathing and Patient connected to face mask oxygen  Post-op Assessment: Report given to RN and Post -op Vital signs reviewed and stable  Post vital signs: Reviewed and stable  Last Vitals:  Vitals Value Taken Time  BP 129/72 07/17/2018  4:05 PM  Temp    Pulse 110 07/17/2018  4:08 PM  Resp 24 07/17/2018  4:08 PM  SpO2 95 % 07/17/2018  4:08 PM  Vitals shown include unvalidated device data.  Last Pain:  Vitals:   07/17/18 1318  TempSrc:   PainSc: 3          Complications: No apparent anesthesia complications. Pt began to obstruct on arrival to PACU. Cleared with a chin lift and jaw thrust, VSS. CRNA stayed with patient until he was able to maintain airway without assistance. Dr. Sampson Goon called to PACU to assess patient.

## 2018-07-17 NOTE — ED Notes (Signed)
Valuables with security 

## 2018-07-17 NOTE — ED Notes (Signed)
Consent signed and at bedside  

## 2018-07-17 NOTE — ED Notes (Signed)
ED Provider at bedside. 

## 2018-07-17 NOTE — Anesthesia Procedure Notes (Signed)
Procedure Name: Intubation Date/Time: 07/17/2018 3:09 PM Performed by: Trinna Post., CRNA Pre-anesthesia Checklist: Patient identified, Emergency Drugs available, Suction available, Patient being monitored and Timeout performed Patient Re-evaluated:Patient Re-evaluated prior to induction Oxygen Delivery Method: Circle system utilized Preoxygenation: Pre-oxygenation with 100% oxygen Induction Type: IV induction, Rapid sequence and Cricoid Pressure applied Laryngoscope Size: Mac and 4 Grade View: Grade II Tube type: Oral Tube size: 7.5 mm Number of attempts: 1 Airway Equipment and Method: Stylet Placement Confirmation: ETT inserted through vocal cords under direct vision,  positive ETCO2 and breath sounds checked- equal and bilateral Secured at: 23 cm Tube secured with: Tape Dental Injury: Teeth and Oropharynx as per pre-operative assessment

## 2018-07-17 NOTE — ED Triage Notes (Signed)
Pt out of room for testing. 

## 2018-07-17 NOTE — H&P (Signed)
[facility] Admission Note  Mark [name] [birth year]  [id number].    Requesting MD: Tanda Rockers, PA-C Chief Complaint/Reason for Consult: Perirectal abscess  HPI: Mark [name] is a 48 y.o. male with a history of RA on Enbrel who presented to [facility] today for right buttock pain.  Patient reports that he has a history of a right peri-rectal abscess that was drained in the ED in December 2019. He reports over the last week he has been having increasing pain in his right buttock and has noticed a swollen, painful, red and firm area is now extending towards his perineum.  He has tried sitz baths for this without any relief.  He reports some associated subjective fever and chills over the last several days.  He denies any abdominal pain, nausea, vomiting, diarrhea, hematochezia, melena or urinary symptoms.  He has not had a bowel movement last 3 days so he is unsure about painful defecation but reports his last bowel movement was not painful and was normal. No melena or hematochezia. His last PO intake was at 3am this morning (water). He is not on any blood thinners. He has never had to go to the OR for a perirectal abscess. No prior abdominal surgeries.   ROS: Review of Systems  Constitutional: Positive for chills and fever.  Respiratory: Negative for cough and shortness of breath.   Cardiovascular: Negative for chest pain.  Gastrointestinal: Positive for constipation. Negative for abdominal pain, blood in stool, diarrhea, melena, nausea and vomiting.       Right buttock pain  Genitourinary: Negative.  Negative for dysuria.  Skin:       Redness  All other systems reviewed and are negative.  All systems reviewed and otherwise negative except for as above  No family history on file.  Past Medical History:  Diagnosis Date  . Hip fracture (HCC)   . Hypertension     History reviewed. No pertinent surgical history.  Social History:  reports that he has never smoked.  He has never used smokeless tobacco. He reports current alcohol use. He reports that he does not use drugs.  Allergies: No Known Allergies  (Not in a hospital admission)   Prior to Admission medications   Medication Sig Start Date End Date Taking? Authorizing Provider  cyclobenzaprine (FLEXERIL) 10 MG tablet Take 1 tablet (10 mg total) by mouth 2 (two) times daily as needed for muscle spasms. 07/12/13   Emilia Beck, PA-C  doxycycline (VIBRAMYCIN) 100 MG capsule Take 1 capsule (100 mg total) by mouth 2 (two) times daily. 03/30/18   Gwyneth Sprout, MD  ibuprofen (ADVIL,MOTRIN) 200 MG tablet Take 600 mg by mouth every 6 (six) hours as needed for fever, headache or mild pain. For pain.    [provider]  naproxen (NAPROSYN) 500 MG tablet Take 1 tablet (500 mg total) by mouth 2 (two) times daily with a meal. 07/12/13   Szekalski, Kaitlyn, PA-C  naproxen (NAPROSYN) 500 MG tablet Take 1 tablet (500 mg total) by mouth 2 (two) times daily. 01/15/14   Kirichenko, Lemont Fillers, PA-C  traMADol (ULTRAM) 50 MG tablet Take 1 tablet (50 mg total) by mouth every 6 (six) hours as needed. 01/15/14   Kirichenko, Tatyana, PA-C    Blood pressure (!) 155/74, pulse 97, temperature 98 F (36.7 C), temperature source Oral, resp. rate 14, height 5\' 10"  (1.778 m), weight 77.1 kg, SpO2 100 %. Physical Exam: General: pleasant, WD/WN AA male who is laying in bed in NAD HEENT: head  is normocephalic, atraumatic.  Sclera are noninjected.  Pupils equal and round.  Ears and nose without any masses or lesions.  Mouth is pink and moist. Dentition fair Heart: regular, rate, and rhythm.  No obvious murmurs, gallops, or rubs noted.  Palpable pedal pulses bilaterally Lungs: CTAB, no wheezes, rhonchi, or rales noted.  Respiratory effort nonlabored Abd: Soft, NT/ND, +BS, no masses, hernias, or organomegaly GU: Chaperone present: There is significant amount induration with overlying erythema and heat of the right buttock  that tracts towards the anus. No obvious areas of fluctuance. No drainage.  MS: all 4 extremities are symmetrical with no cyanosis, clubbing, or edema. Skin: warm and dry with no masses, lesions, or rashes Psych: A&Ox3 with an appropriate affect. Neuro: cranial nerves grossly intact, extremity CSM intact bilaterally, normal speech  Results for orders placed or performed during the hospital encounter of 07/17/18 (from the past 48 hour(s))  Urinalysis, Routine w reflex microscopic     Status: Abnormal   Collection Time: 07/17/18  9:19 AM  Result Value Ref Range   Color, Urine YELLOW YELLOW   APPearance HAZY (A) CLEAR   Specific Gravity, Urine 1.025 1.005 - 1.030   pH 6.0 5.0 - 8.0   Glucose, UA NEGATIVE NEGATIVE mg/dL   Hgb urine dipstick NEGATIVE NEGATIVE   Bilirubin Urine NEGATIVE NEGATIVE   Ketones, ur NEGATIVE NEGATIVE mg/dL   Protein, ur 30 (A) NEGATIVE mg/dL   Nitrite NEGATIVE NEGATIVE   Leukocytes,Ua TRACE (A) NEGATIVE    Comment: Performed at [facility], [address] [place], [place] [address]  Urinalysis, Microscopic (reflex)     Status: Abnormal   Collection Time: 07/17/18  9:19 AM  Result Value Ref Range   RBC / HPF NONE SEEN 0 - 5 RBC/hpf   WBC, UA 0-5 0 - 5 WBC/hpf   Bacteria, UA RARE (A) NONE SEEN   Squamous Epithelial / LPF NONE SEEN 0 - 5   Mucus PRESENT     Comment: Performed at [facility], [address] [place], [place] [address]  CBC with Differential     Status: Abnormal   Collection Time: 07/17/18  9:22 AM  Result Value Ref Range   WBC 20.8 (H) 4.0 - 10.5 K/uL   RBC 4.18 (L) 4.22 - 5.81 MIL/uL   Hemoglobin 12.0 (L) 13.0 - 17.0 g/dL   HCT 86.537.8 (L) 78.439.0 - 69.652.0 %   MCV 90.4 80.0 - 100.0 fL   MCH 28.7 26.0 - 34.0 pg   MCHC 31.7 30.0 - 36.0 g/dL   RDW 29.514.5 28.411.5 - 13.215.5 %   Platelets 378 150 - 400 K/uL   nRBC 0.0 0.0 - 0.2 %   Neutrophils Relative % 78 %   Neutro Abs 16.0 (H) 1.7 - 7.7 K/uL   Lymphocytes Relative 11 %   Lymphs Abs 2.3  0.7 - 4.0 K/uL   Monocytes Relative 10 %   Monocytes Absolute 2.1 (H) 0.1 - 1.0 K/uL   Eosinophils Relative 1 %   Eosinophils Absolute 0.3 0.0 - 0.5 K/uL   Basophils Relative 0 %   Basophils Absolute 0.1 0.0 - 0.1 K/uL   Immature Granulocytes 0 %   Abs Immature Granulocytes 0.09 (H) 0.00 - 0.07 K/uL    Comment: Performed at [facility], [address] [place], [place] [address]  Basic metabolic panel     Status: None   Collection Time: 07/17/18 11:15 AM  Result Value Ref Range  Sodium 138 135 - 145 mmol/L   Potassium 3.8 3.5 - 5.1 mmol/L   Chloride 104 98 - 111 mmol/L   CO2 25 22 - 32 mmol/L   Glucose, Bld 90 70 - 99 mg/dL   BUN 13 6 - 20 mg/dL   Creatinine, Ser 1.61 0.61 - 1.24 mg/dL   Calcium 9.2 8.9 - 09.6 mg/dL   GFR calc non Af Amer >60 >60 mL/min   GFR calc Af Amer >60 >60 mL/min   Anion gap 9 5 - 15    Comment: Performed at [facility], [address], [place], [place] [address]   Ct Pelvis W Contrast  Result Date: 07/17/2018 CLINICAL DATA:  Perianal and buttock abscess. EXAM: CT PELVIS WITH CONTRAST TECHNIQUE: Multidetector CT imaging of the pelvis was performed using the standard protocol following the bolus administration of intravenous contrast. CONTRAST:  ISOVUE-300 IOPAMIDOL (ISOVUE-300) INJECTION 61% COMPARISON:  None. FINDINGS: Urinary Tract: Unremarkable urinary bladder. Simple appearing cyst in the lower pole of the left kidney is incompletely visualized on this study. Bowel: Moderate wall thickening is seen involving the terminal ileum, suspicious for Crohn's disease. A large complex right perianal abscess is seen in the medial right buttock which measures 11.3 x 2.4 cm. Vascular/Lymphatic: No pathologically enlarged lymph nodes or other significant abnormality. Reproductive:  No mass or other significant abnormality. Other: None. Musculoskeletal: Symmetric erosive changes and surrounding sclerosis involving both sacroiliac joints, consistent with  sacroiliitis. IMPRESSION: 1. Large complex right perianal and buttock abscess. 2. Moderate wall thickening involving terminal ileum, suspicious for Crohn's disease. 3. 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 Electronically Signed   By: Myles Rosenthal M.D.   On: 07/17/2018 13:11      Assessment/Plan RA  Moderate wall thickening of terminal ileum on CT, suspicious for chron's disease.   Right Perianal abscess - CT with large complex right perianal and buttock abscess measuring 11.3 x 2.4 cm - WBC 20.8 - NPO - IV Zosyn - Plan for OR today for I&D of abscess. Risks and benefits discussed with patient. He would like to proceed to surgery.   Jacinto Halim, [facility] 07/17/2018, 1:50 PM Pager: [phone]

## 2018-07-17 NOTE — ED Notes (Signed)
Patient transported to CT 

## 2018-07-18 ENCOUNTER — Encounter (HOSPITAL_COMMUNITY): Payer: Self-pay | Admitting: General Surgery

## 2018-07-18 LAB — BASIC METABOLIC PANEL
Anion gap: 10 (ref 5–15)
BUN: 13 mg/dL (ref 6–20)
CO2: 25 mmol/L (ref 22–32)
Calcium: 8.9 mg/dL (ref 8.9–10.3)
Chloride: 105 mmol/L (ref 98–111)
Creatinine, Ser: 0.92 mg/dL (ref 0.61–1.24)
GFR calc Af Amer: >60 mL/min (ref >60)
GFR calc non Af Amer: >60 mL/min (ref >60)
Glucose, Bld: 123 mg/dL — ABNORMAL HIGH (ref 70–99)
Potassium: 4 mmol/L (ref 3.5–5.1)
Sodium: 140 mmol/L (ref 135–145)

## 2018-07-18 LAB — CBC
HCT: 37.5 % — ABNORMAL LOW (ref 39.0–52.0)
Hemoglobin: 11.8 g/dL — ABNORMAL LOW (ref 13.0–17.0)
MCH: 28.7 pg (ref 26.0–34.0)
MCHC: 31.5 g/dL (ref 30.0–36.0)
MCV: 91.2 fL (ref 80.0–100.0)
Platelets: 420 10*3/uL — ABNORMAL HIGH (ref 150–400)
RBC: 4.11 MIL/uL — ABNORMAL LOW (ref 4.22–5.81)
RDW: 14.5 % (ref 11.5–15.5)
WBC: 20 10*3/uL — ABNORMAL HIGH (ref 4.0–10.5)
nRBC: 0 % (ref 0.0–0.2)

## 2018-07-18 LAB — HIV ANTIBODY (ROUTINE TESTING W REFLEX): HIV Screen 4th Generation wRfx: NONREACTIVE

## 2018-07-18 MED ORDER — MORPHINE SULFATE (PF) 2 MG/ML IV SOLN
2.0000 mg | INTRAVENOUS | Status: DC | PRN
  Administered 2018-07-18: 4 mg via INTRAVENOUS
  Administered 2018-07-18: 19:00:00 2 mg via INTRAVENOUS
  Filled 2018-07-18 (×2): qty 2

## 2018-07-18 MED ORDER — HYDROCHLOROTHIAZIDE 25 MG PO TABS
25.0000 mg | ORAL_TABLET | Freq: Every day | ORAL | Status: DC
  Administered 2018-07-18 – 2018-07-20 (×3): 25 mg via ORAL
  Filled 2018-07-18 (×3): qty 1

## 2018-07-18 NOTE — Progress Notes (Signed)
[facility]/Trauma Progress Note  1 Day Post-Op   Assessment/Plan RA on enbrel  Moderate wall thickening of terminal ileum on CT, suspicious for chron's disease.   Right Perianal abscess - CT with large complex right perianal and buttock abscess measuring 11.3 x 2.4 cm - WBC down to 20.0; 04/16 - S/P I&D, Dr. Derrell Lolling, 04/15 - wet to dry packing   FEN: reg diet VTE: SCD's, lovenox ID: Zosyn IV 04/15>> Follow up: [facility] 2 weeks  DISPO: possible discharge tomorrow. Pt states his wife will help with packing changes.     LOS: 1 day    Subjective: CC: rectal pain  He is feeling better since surgery. He was sweaty overnight with a few chills but no fever. No nausea or vomiting overnight. Pain improved since surgery.  Objective: Vital signs in last 24 hours: Temp:  [97.8 F (36.6 C)-99.9 F (37.7 C)] 97.8 F (36.6 C) (04/16 0347) Pulse Rate:  [75-112] 76 (04/16 0347) Resp:  [14-26] 16 (04/16 0347) BP: (118-198)/(61-94) 124/73 (04/16 0347) SpO2:  [92 %-100 %] 96 % (04/16 0347) Last BM Date: 07/14/18  Intake/Output from previous day: 04/15 0701 - 04/16 0700 In: 2100 [I.V.:1000; IV Piggyback:1100] Out: 925 [Urine:900; Blood:25] Intake/Output this shift: Total I/O In: 240 [P.O.:240] Out: -   PE: Gen:  Alert, NAD, pleasant, cooperative Pulm:  Rate and effort normal GU: chaperone present, perirectal wound packing removed, no purulent drainage noted, repacked, pt tolerated it well.  Skin: no rashes noted, warm and dry   Anti-infectives: Anti-infectives (From admission, onward)   Start     Dose/Rate Route Frequency Ordered Stop   07/17/18 1445  piperacillin-tazobactam (ZOSYN) IVPB 3.375 g     3.375 g 100 mL/hr over 30 Minutes Intravenous To [facility] 07/17/18 1430 07/17/18 1542   07/17/18 1400  piperacillin-tazobactam (ZOSYN) IVPB 3.375 g     3.375 g 12.5 mL/hr over 240 Minutes Intravenous Every 8 hours 07/17/18 1350        Lab Results:   Recent Labs    07/17/18 0922 07/18/18 0235  WBC 20.8* 20.0*  HGB 12.0* 11.8*  HCT 37.8* 37.5*  PLT 378 420*   BMET Recent Labs    07/17/18 1115 07/18/18 0235  NA 138 140  K 3.8 4.0  CL 104 105  CO2 25 25  GLUCOSE 90 123*  BUN 13 13  CREATININE 0.92 0.92  CALCIUM 9.2 8.9   PT/INR No results for input(s): LABPROT, INR in the last 72 hours. CMP     Component Value Date/Time   NA 140 07/18/2018 0235   K 4.0 07/18/2018 0235   CL 105 07/18/2018 0235   CO2 25 07/18/2018 0235   GLUCOSE 123 (H) 07/18/2018 0235   BUN 13 07/18/2018 0235   CREATININE 0.92 07/18/2018 0235   CALCIUM 8.9 07/18/2018 0235   GFRNONAA >60 07/18/2018 0235   GFRAA >60 07/18/2018 0235   Lipase  No results found for: LIPASE  Studies/Results: Ct Pelvis W Contrast  Result Date: 07/17/2018 CLINICAL DATA:  Perianal and buttock abscess. EXAM: CT PELVIS WITH CONTRAST TECHNIQUE: Multidetector CT imaging of the pelvis was performed using the standard protocol following the bolus administration of intravenous contrast. CONTRAST:  ISOVUE-300 IOPAMIDOL (ISOVUE-300) INJECTION 61% COMPARISON:  None. FINDINGS: Urinary Tract: Unremarkable urinary bladder. Simple appearing cyst in the lower pole of the left kidney is incompletely visualized on this study. Bowel: Moderate wall thickening is seen involving the terminal ileum, suspicious for Crohn's disease. A large complex right  perianal abscess is seen in the medial right buttock which measures 11.3 x 2.4 cm. Vascular/Lymphatic: No pathologically enlarged lymph nodes or other significant abnormality. Reproductive:  No mass or other significant abnormality. Other: None. Musculoskeletal: Symmetric erosive changes and surrounding sclerosis involving both sacroiliac joints, consistent with sacroiliitis. IMPRESSION: 1. Large complex right perianal and buttock abscess. 2. Moderate wall thickening involving terminal ileum, suspicious for Crohn's disease. 3. Bilateral  sacroiliitis, which can be seen with Crohn's disease or other inflammatory bowel disease. Electronically Signed   By: Myles Rosenthal M.D.   On: 07/17/2018 13:11      Jerre Simon , [facility] 07/18/2018, 9:19 AM  Pager: [phone] Mon-Wed, Friday 7:00am-4:30pm Thurs 7am-11:30am  Consults: [phone]

## 2018-07-18 NOTE — Progress Notes (Signed)
Dr Andrey Campanile returned page, New order received, start HCTZ 25mg  q day, first dose tonight for elevated B/P.

## 2018-07-18 NOTE — Plan of Care (Signed)

## 2018-07-19 LAB — AEROBIC/ANAEROBIC CULTURE W GRAM STAIN (SURGICAL/DEEP WOUND): Culture: NORMAL

## 2018-07-19 LAB — AEROBIC/ANAEROBIC CULTURE (SURGICAL/DEEP WOUND)

## 2018-07-19 MED ORDER — PANTOPRAZOLE SODIUM 40 MG PO TBEC
40.0000 mg | DELAYED_RELEASE_TABLET | Freq: Every day | ORAL | Status: DC
  Administered 2018-07-19: 40 mg via ORAL
  Filled 2018-07-19: qty 1

## 2018-07-19 NOTE — Discharge Instructions (Signed)
WOUND CARE: - dressing to be changed twice daily and/or after every bowel movement  - supplies: sterile saline, kerlix, scissors, ABD pads, tape  - remove dressing and all packing carefully, moistening with sterile saline as needed to avoid packing/internal dressing sticking to the wound. - clean edges of skin around the wound with water/gauze, making sure there is no tape debris or leakage left on skin that could cause skin irritation or breakdown. - dampen and clean kerlix with sterile saline and pack wound from wound base to skin level, making sure to take note of any possible areas of wound tracking, tunneling and packing appropriately. Wound can be packed loosely. Trim kerlix to size if a whole kerlix is not required. - cover wound with a dry ABD pad and secure with tape.  - write the date/time on the dry dressing/tape to better track when the last dressing change occurred. - apply any skin protectant/powder recommended by clinician to protect skin/skin folds. - change dressing as needed if leakage occurs, wound gets contaminated, or patient requests to shower. - patient may shower daily with wound open and following the shower the wound should be dried and a clean dressing placed.      1. PAIN CONTROL:  1. Pain is best controlled by a usual combination of three different methods TOGETHER:  i. Ice/Heat ii. Over the counter pain medication iii. Prescription pain medication 2. You may experience some swelling and bruising in area of wounds. Ice packs or heating pads (30-60 minutes up to 6 times a day) will help. Use ice for the first few days to help decrease swelling and bruising, then switch to heat to help relax tight/sore spots and speed recovery. Some people prefer to use ice alone, heat alone, alternating between ice & heat. Experiment to what works for you. Swelling and bruising can take several weeks to resolve.  3. It is helpful to take an over-the-counter pain medication regularly  for the first few weeks. Choose one of the following that works best for you:  i. Naproxen (Aleve, etc) Two 220mg  tabs twice a day ii. Ibuprofen (Advil, etc) Three 200mg  tabs four times a day (every meal & bedtime) iii. Acetaminophen (Tylenol, etc) 500-650mg  four times a day (every meal & bedtime) 4. A prescription for pain medication (such as oxycodone, hydrocodone, etc) may be given to you upon discharge. Take your pain medication as prescribed.  i. If you are having problems/concerns with the prescription medicine (does not control pain, nausea, vomiting, rash, itching, etc), please call us [phone] to see if we need to switch you to a different pain medicine that will work better for you and/or control your side effect better. ii. If you need a refill on your pain medication, please contact your pharmacy. They will contact our office to request authorization. Prescriptions will not be filled after 5 pm or on week-ends. 1. Avoid getting constipated. When taking pain medications, it is common to experience some constipation. Increasing fluid intake and taking a fiber supplement (such as Metamucil, Citrucel, FiberCon, MiraLax, etc) 1-2 times a day regularly will usually help prevent this problem from occurring. A mild laxative (prune juice, Milk of Magnesia, MiraLax, etc) should be taken according to package directions if there are no bowel movements after 48 hours.  2. Watch out for diarrhea. If you have many loose bowel movements, simplify your diet to bland foods & liquids for a few days. Stop any stool softeners and decrease your fiber supplement. Switching to  mild anti-diarrheal medications (Kayopectate, Pepto Bismol) can help. If this worsens or does not improve, please call us. 3. Shower daily but do not bathe until your wounds heal. Cover your wounds with clean gauze and tape after showering.  4. FOLLOW UP  a. If a follow up appointment is needed one will be scheduled for you. If none is  needed with our trauma team, please follow up with your primary care provider within 2-3 weeks from discharge. Please call [facility] at [phone] if you have any questions about follow up.   WHEN TO CALL us [phone]:  1. Poor pain control 2. Reactions / problems with new medications (rash/itching, nausea, etc)  3. Fever over 101.5 F (38.5 C) 4. Worsening swelling or bruising 5. Redness, swelling, foul discharge or increased pain from wounds 6. Productive cough, difficulty breathing or any other concerning symptoms  The clinic staff is available to answer your questions during regular business hours (8:30am-5pm). Please dont hesitate to call and ask to speak to one of our nurses for clinical concerns.  If you have a medical emergency, go to the nearest emergency room or call 911.  A surgeon from [facility] is always on call at the [facility], [place]  [address], [place], [place] [address] ?  MAIN: [phone] ? TOLL FREE: [phone] ?  FAX [phone]  [email or web address]

## 2018-07-19 NOTE — Plan of Care (Signed)
  Problem: Pain Managment: Goal: General experience of comfort will improve Outcome: Progressing   

## 2018-07-19 NOTE — Anesthesia Postprocedure Evaluation (Signed)
Anesthesia Post Note  Patient: Mark [name]  Procedure(s) Performed: IRRIGATION AND DEBRIDEMENT PERIRECTAL ABSCESS (N/A Rectum)     Patient location during evaluation: PACU Anesthesia Type: General Level of consciousness: awake and alert Pain management: pain level controlled Vital Signs Assessment: post-procedure vital signs reviewed and stable Respiratory status: spontaneous breathing, nonlabored ventilation, respiratory function stable and patient connected to nasal cannula oxygen Cardiovascular status: blood pressure returned to baseline and stable Postop Assessment: no apparent nausea or vomiting Anesthetic complications: no    Last Vitals:  Vitals:   07/19/18 0026 07/19/18 0500  BP: (!) 168/88 (!) 166/90  Pulse:  72  Resp:    Temp:    SpO2:      Last Pain:  Vitals:   07/19/18 0900  TempSrc:   PainSc: 0-No pain   Pain Goal: Patients Stated Pain Goal: 2 (07/18/18 1831)                 Kennieth Rad

## 2018-07-19 NOTE — Progress Notes (Signed)
PHARMACIST - PHYSICIAN COMMUNICATION  DR:   Rhona Raider  CONCERNING: IV to Oral Route Change Policy  RECOMMENDATION: This patient is receiving Protonix by the intravenous route.  Based on criteria approved by the Pharmacy and Therapeutics Committee, the intravenous medication(s) is/are being converted to the equivalent oral dose form(s).   DESCRIPTION: These criteria include:  The patient is eating (either orally or via tube) and/or has been taking other orally administered medications for a least 24 hours  The patient has no evidence of active gastrointestinal bleeding or impaired GI absorption (gastrectomy, short bowel, patient on TNA or NPO).  If you have questions about this conversion, please contact the Pharmacy Department  []   [phone] )  [facility] []   [phone] )  [facility] [x]   [phone] )  [facility] []   [phone] )  [facility] []   [phone] )  [facility]   Tera Mater, [facility] 07/19/2018 9:22 AM

## 2018-07-19 NOTE — Progress Notes (Signed)
2 Days Post-Op  Subjective: CC: Rectal pain He continues to feel better since surgery. Very minimal pain. Tolerating diet without N/V. Beginning to pass flatus. No BM. Mobilizing in room (doesn't want to mobilize in halls). Patient's wife is to help with dressing changes at home. She is a Charity fundraiser at cancer center. He reports he is tolerating dressing changes with minimal pain.   To note, BP was elevated overnight. Was started on oral medications. No hx of HTN. Reports he is a vegan and asking about food options as he ate chicken etc yesterday.   Objective: Vital signs in last 24 hours: Temp:  [98 F (36.7 C)-98.3 F (36.8 C)] 98.3 F (36.8 C) (04/17 0013) Pulse Rate:  [68-83] 72 (04/17 0500) Resp:  [14-18] 18 (04/17 0013) BP: (153-170)/(88-105) 166/90 (04/17 0500) SpO2:  [96 %-100 %] 96 % (04/17 0013) Last BM Date: 07/16/18  Intake/Output from previous day: 04/16 0701 - 04/17 0700 In: 2241.9 [P.O.:240; I.V.:1945.5; IV Piggyback:56.5] Out: 300 [Urine:300] Intake/Output this shift: Total I/O In: 240 [P.O.:240] Out: 250 [Urine:250]  PE: Gen:  Alert, NAD, pleasant, cooperative Pulm:  Rate and effort normal GU: chaperone present, perirectal wound packing removed, no purulent drainage noted, repacked, pt tolerated it well.  Skin: no rashes noted, warm and dry  Lab Results:  Recent Labs    07/17/18 0922 07/18/18 0235  WBC 20.8* 20.0*  HGB 12.0* 11.8*  HCT 37.8* 37.5*  PLT 378 420*   BMET Recent Labs    07/17/18 1115 07/18/18 0235  NA 138 140  K 3.8 4.0  CL 104 105  CO2 25 25  GLUCOSE 90 123*  BUN 13 13  CREATININE 0.92 0.92  CALCIUM 9.2 8.9   PT/INR No results for input(s): LABPROT, INR in the last 72 hours. CMP     Component Value Date/Time   NA 140 07/18/2018 0235   K 4.0 07/18/2018 0235   CL 105 07/18/2018 0235   CO2 25 07/18/2018 0235   GLUCOSE 123 (H) 07/18/2018 0235   BUN 13 07/18/2018 0235   CREATININE 0.92 07/18/2018 0235   CALCIUM 8.9  07/18/2018 0235   GFRNONAA >60 07/18/2018 0235   GFRAA >60 07/18/2018 0235   Lipase  No results found for: LIPASE     Studies/Results: Ct Pelvis W Contrast  Result Date: 07/17/2018 CLINICAL DATA:  Perianal and buttock abscess. EXAM: CT PELVIS WITH CONTRAST TECHNIQUE: Multidetector CT imaging of the pelvis was performed using the standard protocol following the bolus administration of intravenous contrast. CONTRAST:  ISOVUE-300 IOPAMIDOL (ISOVUE-300) INJECTION 61% COMPARISON:  None. FINDINGS: Urinary Tract: Unremarkable urinary bladder. Simple appearing cyst in the lower pole of the left kidney is incompletely visualized on this study. Bowel: Moderate wall thickening is seen involving the terminal ileum, suspicious for Crohn's disease. A large complex right perianal abscess is seen in the medial right buttock which measures 11.3 x 2.4 cm. Vascular/Lymphatic: No pathologically enlarged lymph nodes or other significant abnormality. Reproductive:  No mass or other significant abnormality. Other: None. Musculoskeletal: Symmetric erosive changes and surrounding sclerosis involving both sacroiliac joints, consistent with sacroiliitis. IMPRESSION: 1. Large complex right perianal and buttock abscess. 2. Moderate wall thickening involving terminal ileum, suspicious for Crohn's disease. 3. 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 Electronically Signed   By: Myles Rosenthal M.D.   On: 07/17/2018 13:11    Anti-infectives: Anti-infectives (From admission, onward)   Start     Dose/Rate Route  Frequency Ordered Stop   07/17/18 1445  piperacillin-tazobactam (ZOSYN) IVPB 3.375 g     3.375 g 100 mL/hr over 30 Minutes Intravenous To [facility] 07/17/18 1430 07/17/18 1542   07/17/18 1400  piperacillin-tazobactam (ZOSYN) IVPB 3.375 g     3.375 g 12.5 mL/hr over 240 Minutes Intravenous Every 8 hours 07/17/18 1350         Assessment/Plan RA on  enbrel  Moderate wall thickening of terminal ileum on CT, suspicious for chron's disease.   Right Perianal abscess - CT with large complex right perianal and buttock abscessmeasuring11.3 x 2.4 cm - S/P I&D, Dr. Derrell Lolling, 04/15 - POD 2 - WBC down slightly at 20.0; 4/17 - wet to dry packing  - Mobilize   FEN: reg diet  VTE: SCD's, lovenox ID: Zosyn IV 04/15>> WBC downtrending slightly at 20 (10.8 > 20.8 > 20.0 since admission). Afebrile Follow up: DOW 4/30, PCP for BP (follows with [facility] [address])  DISPO: possible discharge tomorrow. Pt states his wife will help with packing changes. She is an Charity fundraiser at the cancer center. Monitor WBC with CBC in AM. Will need 10d of additional abx. Likely can be d/c on Augmentin, Cx with gram positive cocci, re incubated for better growth. Started on HTN for elevated BP. No hx of this. Will need to be d/c on HCTZ and follow up with PCP as outpatient.    LOS: 2 days    Jacinto Halim , [facility] 07/19/2018, 11:05 AM Pager: [phone]

## 2018-07-20 LAB — CBC
HCT: 36 % — ABNORMAL LOW (ref 39.0–52.0)
Hemoglobin: 11.8 g/dL — ABNORMAL LOW (ref 13.0–17.0)
MCH: 29.6 pg (ref 26.0–34.0)
MCHC: 32.8 g/dL (ref 30.0–36.0)
MCV: 90.2 fL (ref 80.0–100.0)
Platelets: 452 10*3/uL — ABNORMAL HIGH (ref 150–400)
RBC: 3.99 MIL/uL — ABNORMAL LOW (ref 4.22–5.81)
RDW: 14.3 % (ref 11.5–15.5)
WBC: 12.3 10*3/uL — ABNORMAL HIGH (ref 4.0–10.5)
nRBC: 0 % (ref 0.0–0.2)

## 2018-07-20 MED ORDER — OXYCODONE HCL 5 MG PO TABS: 5.0000 mg | ORAL_TABLET | Freq: Four times a day (QID) | ORAL | 0 refills | Status: DC | PRN

## 2018-07-20 NOTE — Discharge Summary (Signed)
Physician Discharge Summary  Patient ID: Mark [name] MRN: [id number] DOB/AGE: 49-Oct-1972 49 y.o.  PCP: [facility]  Admit date: 07/17/2018 Discharge date: 07/20/2018  Admission Diagnoses:  Perirectal abscess  Discharge Diagnoses:  Post I & D of perirectal abscess  Active Problems:   Perirectal abscess   Surgery:  I & D of perirectal abscess  Discharged Condition: improved  Hospital Course:   Had I & D.  WBC has been followed and has decreased and is down to 12K today.  Up walking and feeling much better.  OK for discharge and wife (a nurse) will pack for him.    Consults: none  Significant Diagnostic Studies: lab as reported    Discharge Exam: Blood pressure (!) 156/84, pulse 60, temperature 98.7 F (37.1 C), temperature source Oral, resp. rate 14, height 5\' 10"  (1.778 m), weight 77.1 kg, SpO2 98 %. Pain much better and walking OK.    Disposition: Discharge disposition: 01-Home or Self Care       Discharge Instructions    Call MD for:  redness, tenderness, or signs of infection (pain, swelling, redness, odor or green/yellow discharge around incision site)   Complete by:  As directed    Call MD for:  temperature >100.4   Complete by:  As directed    Diet - low sodium heart healthy   Complete by:  As directed    Discharge instructions   Complete by:  As directed    Daily repack perirectal wound with enough gauze to keep the wound open without excessive packing   Discharge wound care:   Complete by:  As directed    Should time packing changes with showers/baths--remove packing and the shower/bath and then repack   Increase activity slowly   Complete by:  As directed      Allergies as of 07/20/2018      Reactions   Peanut-containing Drug Products Itching, Rash      Medication List    TAKE these medications   Enbrel SureClick 50 MG/ML injection Generic drug:  etanercept Inject 50 mg into the skin every Saturday.    ibuprofen 200 MG tablet Commonly known as:  ADVIL Take 600 mg by mouth every 6 (six) hours as needed for fever, headache or mild pain. For pain.   oxyCODONE 5 MG immediate release tablet Commonly known as:  Oxy IR/ROXICODONE Take 1 tablet (5 mg total) by mouth every 6 (six) hours as needed for severe pain.            Discharge Care Instructions  (From admission, onward)         Start     Ordered   07/20/18 0000  Discharge wound care:    Comments:  Should time packing changes with showers/baths--remove packing and the shower/bath and then repack   07/20/18 0814         Follow-up Information    [facility] Call in 1 day(s).   Specialty:  General Surgery Why:  at 3:00pm. Please arrive 20 mins prior to complete paperwork. Please bring photo ID and insurance card.  Contact information: [address] [place] [address] [phone]       [facility]. Call.   Specialty:  Family Medicine Why:  To schedule an appointment in regards to your elevated BP while in the hospital  Contact information: [address] [place] [address] [phone]  Signed: Valarie Merino 07/20/2018, 8:15 AM

## 2018-07-20 NOTE — Progress Notes (Signed)
Mark [name] to be D/C'd Home per MD order.  Discussed prescriptions and follow up appointments with the patient. Prescriptions given to patient, medication list explained in detail. Pt verbalized understanding.  Allergies as of 07/20/2018      Reactions   Peanut-containing Drug Products Itching, Rash      Medication List    TAKE these medications   Enbrel SureClick 50 MG/ML injection Generic drug:  etanercept Inject 50 mg into the skin every Saturday.   ibuprofen 200 MG tablet Commonly known as:  ADVIL Take 600 mg by mouth every 6 (six) hours as needed for fever, headache or mild pain. For pain.   oxyCODONE 5 MG immediate release tablet Commonly known as:  Oxy IR/ROXICODONE Take 1 tablet (5 mg total) by mouth every 6 (six) hours as needed for severe pain.            Discharge Care Instructions  (From admission, onward)         Start     Ordered   07/20/18 0000  Discharge wound care:    Comments:  Should time packing changes with showers/baths--remove packing and the shower/bath and then repack   07/20/18 0814          Vitals:   07/20/18 0453 07/20/18 0822  BP: (!) 156/84 (!) 159/98  Pulse: 60 74  Resp: 14 16  Temp: 98.7 F (37.1 C) 98.6 F (37 C)  SpO2: 98% 99%    Skin clean, dry and intact without evidence of skin break down, no evidence of skin tears noted. IV catheter discontinued intact. Dressing change on day shift, 07/20/2018, area clean, dry, and intact. Wet to dry dressing, do not pack tight per MD, use kerlex with NS(WET), pack area, over with ABD not tape, hold with mesh panties. Have gone over instructions with patient and wife. Site without signs and symptoms of complications. Dressing and pressure applied. Pt denies pain at this time. No complaints noted.  An After Visit Summary was printed and given to the patient. Patient escorted via WC, and D/C home via private auto.  [place] Luva Metzger RN

## 2018-08-03 ENCOUNTER — Other Ambulatory Visit: Payer: Self-pay

## 2018-08-03 ENCOUNTER — Emergency Department (HOSPITAL_COMMUNITY)
Admission: EM | Admit: 2018-08-03 | Discharge: 2018-08-03 | Disposition: A | Discharge status: Home / Self Care | Payer: BC Managed Care – PPO | Attending: Emergency Medicine | Admitting: Emergency Medicine

## 2018-08-03 ENCOUNTER — Encounter (HOSPITAL_COMMUNITY): Payer: Self-pay | Admitting: Emergency Medicine

## 2018-08-03 DIAGNOSIS — L732 Hidradenitis suppurativa: Secondary | ICD-10-CM | POA: Insufficient documentation

## 2018-08-03 DIAGNOSIS — M069 Rheumatoid arthritis, unspecified: Secondary | ICD-10-CM | POA: Diagnosis not present

## 2018-08-03 DIAGNOSIS — I10 Essential (primary) hypertension: Secondary | ICD-10-CM | POA: Diagnosis not present

## 2018-08-03 DIAGNOSIS — Z79899 Other long term (current) drug therapy: Secondary | ICD-10-CM | POA: Insufficient documentation

## 2018-08-03 DIAGNOSIS — R0602 Shortness of breath: Secondary | ICD-10-CM | POA: Diagnosis present

## 2018-08-03 MED ORDER — LIDOCAINE HCL (PF) 1 % IJ SOLN
30.0000 mL | Freq: Once | INTRAMUSCULAR | Status: AC
  Administered 2018-08-03: 30 mL
  Filled 2018-08-03: qty 30

## 2018-08-03 MED ORDER — ALUM & MAG HYDROXIDE-SIMETH 200-200-20 MG/5ML PO SUSP
30.0000 mL | Freq: Once | ORAL | Status: AC
  Administered 2018-08-03: 30 mL via ORAL
  Filled 2018-08-03: qty 30

## 2018-08-03 MED ORDER — SODIUM CHLORIDE 0.9 % IV BOLUS: 1000.0000 mL | Freq: Once | INTRAVENOUS | Status: DC

## 2018-08-03 MED ORDER — LORAZEPAM 2 MG/ML IJ SOLN: 1.0000 mg | Freq: Once | INTRAMUSCULAR | Status: DC

## 2018-08-03 NOTE — ED Notes (Signed)
Patient verbalizes understanding of discharge instructions . Opportunity for questions and answers were provided . Armband removed by staff ,Pt discharged from ED. W/C  offered at D/C  and Declined W/C at D/C and was escorted to lobby by RN.  

## 2018-08-03 NOTE — Discharge Instructions (Addendum)
Contact a health care provider if you have: A flare-up of hidradenitis suppurativa. A fever or chills. Trouble controlling your symptoms at home. Trouble doing your daily activities because of your symptoms. Trouble dealing with emotional problems related to your condition.  WOUND CARE  Keep area clean and dry for 24 hours. Do not remove bandage, if applied.  After 24 hours, remove bandage and wash wound gently with mild soap and warm water. Reapply a new bandage after cleaning wound, if directed.  Continue daily cleansing with soap and water until stitches/staples are removed.  Seek medical careif you experience any of the following signs of infection: Swelling, redness, pus drainage, streaking, fever >101.0 F  Seek care if you experience excessive bleeding that does not stop after 15-20 minutes of constant, firm pressure.

## 2018-08-03 NOTE — ED Triage Notes (Signed)
Pt. Stated, I have abscesses underneath both both arms more than a month. My SOB has been since I left here a week and half a go. I was here to fix my abscesses a week and half.  Ive had SOB since then.

## 2018-08-03 NOTE — ED Provider Notes (Addendum)
[facility] EMERGENCY DEPARTMENT Provider Note   CSN: [id number] Arrival date & time: 08/03/18  1411    History   Chief Complaint Chief Complaint  Patient presents with  . Shortness of Breath  . Abscess    HPI Mark [name] is a 48 y.o. male.  Who presents emergency department with chief complaint of bilateral axillary abscesses.  He has a history of these in the past.  Patient is also on Enbrel therapy for his rheumatoid arthritis.  Patient is complaining of shortness of breath as well.  He states that he is having sensation in his epigastrium like he needs to burp making him feel like he is short of breath.  He denies chest pain or pressure.  He denies orthopnea, PND, exertional dyspnea.  He is unsure what heartburn feels like because he is never had that before.  He denies any nausea.  Patient also denies fevers and chills.     HPI  Past Medical History:  Diagnosis Date  . Hip fracture (HCC)   . Hypertension     Patient Active Problem List   Diagnosis Date Noted  . Perirectal abscess 07/17/2018    Past Surgical History:  Procedure Laterality Date  . INCISION AND DRAINAGE PERIRECTAL ABSCESS N/A 07/17/2018   Procedure: IRRIGATION AND DEBRIDEMENT PERIRECTAL ABSCESS;  Surgeon: Axel Filler, MD;  Location: [facility] OR;  Service: General;  Laterality: N/A;        Home Medications    Prior to Admission medications   Medication Sig Start Date End Date Taking? Authorizing Provider  ENBREL SURECLICK 50 MG/ML injection Inject 50 mg into the skin every Saturday.  06/15/18   [provider]  ibuprofen (ADVIL,MOTRIN) 200 MG tablet Take 600 mg by mouth every 6 (six) hours as needed for fever, headache or mild pain. For pain.    [provider]  oxyCODONE (OXY IR/ROXICODONE) 5 MG immediate release tablet Take 1 tablet (5 mg total) by mouth every 6 (six) hours as needed for severe pain. 07/20/18   Luretha Murphy, MD    Family History No family  history on file.  Social History Social History   Tobacco Use  . Smoking status: Never Smoker  . Smokeless tobacco: Never Used  Substance Use Topics  . Alcohol use: Yes  . Drug use: No    Frequency: 3.0 times per week     Allergies   Peanut-containing drug products   Review of Systems Review of Systems Ten systems reviewed and are negative for acute change, except as noted in the HPI.    Physical Exam Updated Vital Signs BP (!) 165/82 (BP Location: Right Arm)   Pulse 85   Temp 98.6 F (37 C) (Oral)   Resp 16   SpO2 100%   Physical Exam Vitals signs and nursing note reviewed.  Constitutional:      General: He is not in acute distress.    Appearance: He is well-developed. He is not diaphoretic.  HENT:     Head: Normocephalic and atraumatic.  Eyes:     General: No scleral icterus.    Conjunctiva/sclera: Conjunctivae normal.  Neck:     Musculoskeletal: Normal range of motion and neck supple.  Cardiovascular:     Rate and Rhythm: Normal rate and regular rhythm.     Heart sounds: Normal heart sounds.  Pulmonary:     Effort: Pulmonary effort is normal. No respiratory distress.     Breath sounds: Normal breath sounds. No decreased breath sounds,  wheezing, rhonchi or rales.  Abdominal:     Palpations: Abdomen is soft.     Tenderness: There is no abdominal tenderness.  Skin:    General: Skin is warm and dry.     Comments: Bilateral axillary abscesses of the sweat glands.  Bilateral keloid hypertrophy.  Tenderness.  No erythema surrounding, no induration or streaking  Neurological:     Mental Status: He is alert.  Psychiatric:        Behavior: Behavior normal.      ED Treatments / Results  Labs (all labs ordered are listed, but only abnormal results are displayed) Labs Reviewed - No data to display  EKG None ED ECG REPORT   Date: 08/04/2018  Rate: 66  Rhythm: normal sinus rhythm  QRS Axis: normal  Intervals: normal  ST/T Wave abnormalities: early  repolarization  Conduction Disutrbances:none  Narrative Interpretation: Normal sinus with early repolarization   I have personally reviewed the EKG tracing and agree with the computerized printout as noted.  Radiology No results found.  Procedures .Marland KitchenIncision and Drainage Date/Time: 08/03/2018 4:49 PM Performed by: Arthor Captain, PA-C Authorized by: Arthor Captain, PA-C   Consent:    Consent obtained:  Verbal   Consent given by:  Patient   Risks discussed:  Bleeding, incomplete drainage, pain and infection   Alternatives discussed:  No treatment, delayed treatment and alternative treatment Location:    Type:  Abscess   Size:  5   Location:  Upper extremity   Upper extremity location: Left axilla. Pre-procedure details:    Skin preparation:  Betadine Anesthesia (see MAR for exact dosages):    Anesthesia method:  Local infiltration   Local anesthetic:  Lidocaine 1% w/o epi Procedure type:    Complexity:  Simple Procedure details:    Incision types:  Stab incision and single straight   Scalpel blade:  11   Wound management:  Probed and deloculated   Drainage:  Bloody   Drainage amount:  Moderate   Wound treatment:  Wound left open   Packing materials:  None Post-procedure details:    Patient tolerance of procedure:  Tolerated well, no immediate complications .Marland KitchenIncision and Drainage Date/Time: 08/03/2018 4:50 PM Performed by: Arthor Captain, PA-C Authorized by: Arthor Captain, PA-C   Consent:    Consent obtained:  Verbal   Consent given by:  Patient   Risks discussed:  Bleeding, incomplete drainage, pain and damage to other organs   Alternatives discussed:  No treatment Universal protocol:    Procedure explained and questions answered to patient or proxy's satisfaction: yes     Relevant documents present and verified: yes     Test results available and properly labeled: yes     Imaging studies available: yes     Required blood products, implants, devices, and  special equipment available: yes     Site/side marked: yes     Immediately prior to procedure a time out was called: yes     Patient identity confirmed:  Verbally with patient Location:    Type:  Abscess   Location:  Upper extremity   Upper extremity location: Right axilla. Pre-procedure details:    Skin preparation:  Betadine Anesthesia (see MAR for exact dosages):    Anesthesia method:  Local infiltration   Local anesthetic:  Lidocaine 1% WITH epi Procedure type:    Complexity:  Simple Procedure details:    Incision types:  Single straight   Incision depth:  Subcutaneous   Scalpel blade:  11  Wound management:  Probed and deloculated, irrigated with saline and extensive cleaning   Drainage:  Purulent and bloody   Drainage amount:  Moderate   Packing materials:  1/4 in gauze Post-procedure details:    Patient tolerance of procedure:  Tolerated well, no immediate complications   (including critical care time)  Medications Ordered in ED Medications  alum & mag hydroxide-simeth (MAALOX/MYLANTA) 200-200-20 MG/5ML suspension 30 mL (30 mLs Oral Given 08/03/18 1513)  lidocaine (PF) (XYLOCAINE) 1 % injection 30 mL (30 mLs Infiltration Given 08/03/18 1511)     Initial Impression / Assessment and Plan / ED Course  I have reviewed the triage vital signs and the nursing notes.  Pertinent labs & imaging results that were available during my care of the patient were reviewed by me and considered in my medical decision making (see chart for details).       EKG shows early re-pole Patient shortness of breath symptoms improved after the Lasix.  I suspect he is having some abdominal bloating and reflux symptoms.  Patient does have a history of hypertension but he is not having any symptoms of CHF including orthopnea, dyspnea on exertion, chest pain, PND.  Patient also with bilateral axillary abscesses.  These were I indeed here.  He tolerated the procedures well.  Discussed return  precautions.  Discussed outpatient follow-up.  He appears appropriate for discharge at this time  Final Clinical Impressions(s) / ED Diagnoses   Final diagnoses:  Hidradenitis axillaris    ED Discharge Orders    None       Arthor CaptainHarris, Daylon Lafavor, PA-C 08/04/18 2324    Arby BarrettePfeiffer, Marcy, MD 08/12/18 Lavetta [id number]    Zianne Schubring, MariettaAbigail, PA-C 08/17/18 [id number]    Arby BarrettePfeiffer, Marcy, MD 08/28/18 1747

## 2018-08-12 ENCOUNTER — Other Ambulatory Visit: Payer: Self-pay

## 2018-08-12 ENCOUNTER — Emergency Department (HOSPITAL_COMMUNITY)
Admission: EM | Admit: 2018-08-12 | Discharge: 2018-08-12 | Disposition: A | Discharge status: Home / Self Care | Payer: BC Managed Care – PPO | Attending: Emergency Medicine | Admitting: Emergency Medicine

## 2018-08-12 ENCOUNTER — Encounter (HOSPITAL_COMMUNITY): Payer: Self-pay | Admitting: Emergency Medicine

## 2018-08-12 DIAGNOSIS — L7622 Postprocedural hemorrhage and hematoma of skin and subcutaneous tissue following other procedure: Secondary | ICD-10-CM | POA: Diagnosis not present

## 2018-08-12 DIAGNOSIS — Z91010 Allergy to peanuts: Secondary | ICD-10-CM | POA: Diagnosis not present

## 2018-08-12 DIAGNOSIS — I10 Essential (primary) hypertension: Secondary | ICD-10-CM | POA: Insufficient documentation

## 2018-08-12 DIAGNOSIS — Z79899 Other long term (current) drug therapy: Secondary | ICD-10-CM | POA: Insufficient documentation

## 2018-08-12 DIAGNOSIS — R58 Hemorrhage, not elsewhere classified: Secondary | ICD-10-CM

## 2018-08-12 MED ORDER — LIDOCAINE-EPINEPHRINE (PF) 2 %-1:200000 IJ SOLN
20.0000 mL | Freq: Once | INTRAMUSCULAR | Status: AC
  Administered 2018-08-12: 20 mL
  Filled 2018-08-12: qty 20

## 2018-08-12 NOTE — Discharge Instructions (Addendum)
Please leave the packing in place for the next 48 hours.  Please remove at that point.  If you develop bleeding again please apply direct pressure.  If this does not cause the bleeding stopped please come to the emergency room.  If you develop any new or worsening signs or symptoms follow-up immediately.

## 2018-08-12 NOTE — ED Provider Notes (Signed)
[facility] EMERGENCY DEPARTMENT Provider Note   CSN: [id number] Arrival date & time: 08/12/18  0962    History   Chief Complaint No chief complaint on file.   HPI Ervine Pembleton is a 48 y.o. male.     HPI    48 year old male presents today with complaints of bleed.  Patient notes that he was seen on Aug 03, 2018 with a axillary abscess right.  He had this I&D without problem.  He notes no significant bleeding or problems thereafter.  Patient notes that this morning he developed bleeding from the abscess.  No surrounding erythema, no fever, no significant pain.  He denies any trauma to the area but does remember reaching up with his right arm yesterday and feeling soreness in the axilla.  He is not on blood thinners, does not drink alcohol.   Past Medical History:  Diagnosis Date  . Hip fracture (HCC)   . Hypertension     Patient Active Problem List   Diagnosis Date Noted  . Perirectal abscess 07/17/2018    Past Surgical History:  Procedure Laterality Date  . INCISION AND DRAINAGE PERIRECTAL ABSCESS N/A 07/17/2018   Procedure: IRRIGATION AND DEBRIDEMENT PERIRECTAL ABSCESS;  Surgeon: Axel Filler, MD;  Location: [facility] OR;  Service: General;  Laterality: N/A;        Home Medications    Prior to Admission medications   Medication Sig Start Date End Date Taking? Authorizing Provider  ENBREL SURECLICK 50 MG/ML injection Inject 50 mg into the skin every Saturday.  06/15/18   [provider]  ibuprofen (ADVIL,MOTRIN) 200 MG tablet Take 600 mg by mouth every 6 (six) hours as needed for fever, headache or mild pain. For pain.    [provider]  oxyCODONE (OXY IR/ROXICODONE) 5 MG immediate release tablet Take 1 tablet (5 mg total) by mouth every 6 (six) hours as needed for severe pain. 07/20/18   Luretha Murphy, MD    Family History No family history on file.  Social History Social History   Tobacco Use  . Smoking status: Never  Smoker  . Smokeless tobacco: Never Used  Substance Use Topics  . Alcohol use: Yes  . Drug use: No    Frequency: 3.0 times per week     Allergies   Peanut-containing drug products   Review of Systems Review of Systems  All other systems reviewed and are negative.    Physical Exam Updated Vital Signs BP (!) 163/98   Pulse 87   Temp 98.3 F (36.8 C) (Oral)   Resp 18   Wt 77.1 kg   SpO2 100%   BMI 24.39 kg/m   Physical Exam Vitals signs and nursing note reviewed.  Constitutional:      Appearance: He is well-developed.  HENT:     Head: Normocephalic and atraumatic.  Eyes:     General: No scleral icterus.       Right eye: No discharge.        Left eye: No discharge.     Conjunctiva/sclera: Conjunctivae normal.     Pupils: Pupils are equal, round, and reactive to light.  Neck:     Musculoskeletal: Normal range of motion.     Vascular: No JVD.     Trachea: No tracheal deviation.  Pulmonary:     Effort: Pulmonary effort is normal.     Breath sounds: No stridor.  Musculoskeletal:     Comments: 0.5 cm incision noted to the right axilla with small amount  of bleeding noted- no spurting or rapid bleed-no surrounding erythema or purulence  Neurological:     Mental Status: He is alert and oriented to person, place, and time.     Coordination: Coordination normal.  Psychiatric:        Behavior: Behavior normal.        Thought Content: Thought content normal.        Judgment: Judgment normal.      ED Treatments / Results  Labs (all labs ordered are listed, but only abnormal results are displayed) Labs Reviewed - No data to display  EKG None  Radiology No results found.  Procedures Procedures (including critical care time)  Medications Ordered in ED Medications  lidocaine-EPINEPHrine (XYLOCAINE W/EPI) 2 %-1:200000 (PF) injection 20 mL (20 mLs Infiltration Given 08/12/18 0802)     Initial Impression / Assessment and Plan / ED Course  I have reviewed the  triage vital signs and the nursing notes.  Pertinent labs & imaging results that were available during my care of the patient were reviewed by me and considered in my medical decision making (see chart for details).        48 year old male presents today with axillary bleed.  Direct pressure did not stop bleeding, this does not appear to be arterial in nature.  Area was cleansed with alcohol and lidocaine 1% with epinephrine was injected to the surrounding tissue 1.5 centimeters of ribbon gauze was packed into the small cavity.  This provided hemostasis.  Patient kept pressure on the area for approximately 15 minutes.  He was reevaluated no subsequent bleeding.  Will be discharged with instructions to remove packing in 48 hours.  He will return immediately if he develops any new or worsening signs or symptoms.  He verbalized understanding and agreement to today's plan had no further questions or concerns.  Final Clinical Impressions(s) / ED Diagnoses   Final diagnoses:  Bleeding    ED Discharge Orders    None       Rosalio Loud 08/12/18 1219    Gerhard Munch, MD 08/12/18 1317

## 2018-08-12 NOTE — ED Triage Notes (Signed)
Pt in with R arm abcess that was drained on 5/2. Pt states he woke up with it bleeding this am. Bleeding controlled with gauze

## 2018-08-27 ENCOUNTER — Ambulatory Visit: Payer: Self-pay | Admitting: Surgery

## 2018-08-27 NOTE — H&P (View-Only) (Signed)
Mark [name] Documented: 08/27/2018 9:18 AM Location: [facility] Patient #: [id number] DOB: 1970/12/10 Single / Language: [place] / Race: Black or African American Male  History of Present Illness Mark Fus A. 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 He was treated in April 2020.  Mark [name] by Dr. Derrell Lolling. He had a second abscess was drained about a month ago above this. These have all healed his perineum that is no longer finish. He does have issues with his axillary hidradenitis. He was seen in the past for this. He had one episode of abscess drainage in the right side in the emergency room about a month ago he states. He is eager to have these areas excised control disease since he has failed medical management.  The patient is a 48 year old male.   Allergies Mark [name], CMA; 08/27/2018 9:19 AM) No Known Drug Allergies [02/18/2018]: Allergies Reconciled  Medication History Mark [name], CMA; 08/27/2018 9:19 AM) amLODIPine Besylate (10MG  Tablet, Oral) Active. Enbrel SureClick (50MG /ML Soln Auto-inj, Subcutaneous) Active. Medications Reconciled     Physical Exam (Mark [name]; 08/27/2018 10:12 AM)  General Mental Status-Alert. General Appearance-Consistent with stated age. Hydration-Well hydrated. Voice-Normal.  Integumentary Note: Bilateral axillary hidradenitis. The areas measured 4 cm x 5 cm clustered in the central portion of both axilla symmetrically. No signs of abscess, redness or drainage.  Chest and Lung Exam Chest and lung exam reveals -quiet, even and easy respiratory effort with no use of accessory muscles and on auscultation, normal breath sounds, no adventitious sounds and normal vocal resonance. Inspection Chest Wall - Normal. Back - normal.  Cardiovascular Cardiovascular examination reveals -normal heart sounds, regular rate and  rhythm with no murmurs and normal pedal pulses bilaterally.  Abdomen Inspection Inspection of the abdomen reveals - No Hernias. Skin - Scar - no surgical scars. Palpation/Percussion Palpation and Percussion of the abdomen reveal - Soft, Non Tender, No Rebound tenderness, No Rigidity (guarding) and No hepatosplenomegaly. Auscultation Auscultation of the abdomen reveals - Bowel sounds normal.  Neurologic Neurologic evaluation reveals -alert and oriented x 3 with no impairment of recent or remote memory. Mental Status-Normal.  Musculoskeletal Normal Exam - Left-Upper Extremity Strength Normal and Lower Extremity Strength Normal. Normal Exam - Right-Upper Extremity Strength Normal and Lower Extremity Strength Normal.    Assessment & Plan (Mark [name]; 08/27/2018 9:36 AM)  HIDRADENITIS AXILLARIS (L73.2) Impression: Discussed excision. Discussed potential complications especially given his immunocompromised state. He will need to hold his embrel for 1 week prior to surgery and until his wounds heal. Risk of wound complications in his case which could include poor wound healing, infection, the need for chronic long-term wound care and the need for revisional surgery. Other risks of surgery including bleeding, infection, loss of range of motion upper extremities requiring more surgery to correct that. I do not feel medical management would be helpful in this case. He agrees to proceed.  Current Plans Pt Education - [facility] Free Text Education/Instructions: discussed with patient and provided information. The anatomy of the intragluteal cleft was discussed. Pathophysiology of pilonidal disease was discussed. The importance of keeping hairs trimmed to avoid recurrence was discussed. Discussion of options such as curretage, excision with closure vs leaving open was discussed. Risks of infection with need for incision and drainage & antibiotics were discussed. I noted a good likelihood  this will help address the problem.  At this point, I think the patient would  best served with considering surgery to excise the diseased tissue. I will make an attempt to close but it may need to be left open to allow it to heal with secondary intention and wound packing. Possible recurrences need reoperation or different techniques were discussed as well. I noted that recurrence is higher with poor compliance on hair removal hygiene & overall health. The patient's questions were answered. The patient agrees to proceed. 

## 2018-08-27 NOTE — H&P (Signed)
Mark [name] Documented: 08/27/2018 9:18 AM Location: [facility] Patient #: [id number] DOB: 1970/12/10 Single / Language: [place] / Race: Black or African American Male  History of Present Illness Maisie Fus A. Shaquanda Graves MD; 08/27/2018 10:11 AM) Patient words: Patient returns for follow-up of his bilateral axillary hidradenitis. He was treated in April 2020.  Prolapse S Cohen by Dr. Derrell Lolling. He had a second abscess was drained about a month ago above this. These have all healed his perineum that is no longer finish. He does have issues with his axillary hidradenitis. He was seen in the past for this. He had one episode of abscess drainage in the right side in the emergency room about a month ago he states. He is eager to have these areas excised control disease since he has failed medical management.  The patient is a 48 year old male.   Allergies Maurilio [name], CMA; 08/27/2018 9:19 AM) No Known Drug Allergies [02/18/2018]: Allergies Reconciled  Medication History Maurilio [name], CMA; 08/27/2018 9:19 AM) amLODIPine Besylate (10MG  Tablet, Oral) Active. Enbrel SureClick (50MG /ML Soln Auto-inj, Subcutaneous) Active. Medications Reconciled     Physical Exam ([place] Paiz A. Brain Honeycutt MD; 08/27/2018 10:12 AM)  General Mental Status-Alert. General Appearance-Consistent with stated age. Hydration-Well hydrated. Voice-Normal.  Integumentary Note: Bilateral axillary hidradenitis. The areas measured 4 cm x 5 cm clustered in the central portion of both axilla symmetrically. No signs of abscess, redness or drainage.  Chest and Lung Exam Chest and lung exam reveals -quiet, even and easy respiratory effort with no use of accessory muscles and on auscultation, normal breath sounds, no adventitious sounds and normal vocal resonance. Inspection Chest Wall - Normal. Back - normal.  Cardiovascular Cardiovascular examination reveals -normal heart sounds, regular rate and  rhythm with no murmurs and normal pedal pulses bilaterally.  Abdomen Inspection Inspection of the abdomen reveals - No Hernias. Skin - Scar - no surgical scars. Palpation/Percussion Palpation and Percussion of the abdomen reveal - Soft, Non Tender, No Rebound tenderness, No Rigidity (guarding) and No hepatosplenomegaly. Auscultation Auscultation of the abdomen reveals - Bowel sounds normal.  Neurologic Neurologic evaluation reveals -alert and oriented x 3 with no impairment of recent or remote memory. Mental Status-Normal.  Musculoskeletal Normal Exam - Left-Upper Extremity Strength Normal and Lower Extremity Strength Normal. Normal Exam - Right-Upper Extremity Strength Normal and Lower Extremity Strength Normal.    Assessment & Plan (Jaylani Mcguinn A. Kavan Devan MD; 08/27/2018 9:36 AM)  HIDRADENITIS AXILLARIS (L73.2) Impression: Discussed excision. Discussed potential complications especially given his immunocompromised state. He will need to hold his embrel for 1 week prior to surgery and until his wounds heal. Risk of wound complications in his case which could include poor wound healing, infection, the need for chronic long-term wound care and the need for revisional surgery. Other risks of surgery including bleeding, infection, loss of range of motion upper extremities requiring more surgery to correct that. I do not feel medical management would be helpful in this case. He agrees to proceed.  Current Plans Pt Education - [facility] Free Text Education/Instructions: discussed with patient and provided information. The anatomy of the intragluteal cleft was discussed. Pathophysiology of pilonidal disease was discussed. The importance of keeping hairs trimmed to avoid recurrence was discussed. Discussion of options such as curretage, excision with closure vs leaving open was discussed. Risks of infection with need for incision and drainage & antibiotics were discussed. I noted a good likelihood  this will help address the problem.  At this point, I think the patient would  best served with considering surgery to excise the diseased tissue. I will make an attempt to close but it may need to be left open to allow it to heal with secondary intention and wound packing. Possible recurrences need reoperation or different techniques were discussed as well. I noted that recurrence is higher with poor compliance on hair removal hygiene & overall health. The patient's questions were answered. The patient agrees to proceed.

## 2018-09-11 ENCOUNTER — Encounter (HOSPITAL_BASED_OUTPATIENT_CLINIC_OR_DEPARTMENT_OTHER): Payer: Self-pay | Admitting: *Deleted

## 2018-09-11 ENCOUNTER — Other Ambulatory Visit: Payer: Self-pay

## 2018-09-13 ENCOUNTER — Other Ambulatory Visit: Payer: Self-pay

## 2018-09-13 ENCOUNTER — Encounter (HOSPITAL_BASED_OUTPATIENT_CLINIC_OR_DEPARTMENT_OTHER)
Admission: RE | Admit: 2018-09-13 | Discharge: 2018-09-13 | Disposition: A | Discharge status: Home / Self Care | Payer: BC Managed Care – PPO | Source: Ambulatory Visit | Attending: Surgery | Admitting: Surgery

## 2018-09-13 DIAGNOSIS — Z01812 Encounter for preprocedural laboratory examination: Secondary | ICD-10-CM | POA: Insufficient documentation

## 2018-09-13 DIAGNOSIS — L732 Hidradenitis suppurativa: Secondary | ICD-10-CM | POA: Insufficient documentation

## 2018-09-13 LAB — COMPREHENSIVE METABOLIC PANEL
ALT: 24 U/L (ref 0–44)
AST: 25 U/L (ref 15–41)
Albumin: 3.7 g/dL (ref 3.5–5.0)
Alkaline Phosphatase: 136 U/L — ABNORMAL HIGH (ref 38–126)
Anion gap: 8 (ref 5–15)
BUN: 15 mg/dL (ref 6–20)
CO2: 27 mmol/L (ref 22–32)
Calcium: 8.9 mg/dL (ref 8.9–10.3)
Chloride: 105 mmol/L (ref 98–111)
Creatinine, Ser: 0.99 mg/dL (ref 0.61–1.24)
GFR calc Af Amer: >60 mL/min (ref >60)
GFR calc non Af Amer: >60 mL/min (ref >60)
Glucose, Bld: 95 mg/dL (ref 70–99)
Potassium: 3.9 mmol/L (ref 3.5–5.1)
Sodium: 140 mmol/L (ref 135–145)
Total Bilirubin: 0.6 mg/dL (ref 0.3–1.2)
Total Protein: 7.3 g/dL (ref 6.5–8.1)

## 2018-09-13 LAB — CBC WITH DIFFERENTIAL/PLATELET
Abs Immature Granulocytes: 0.02 10*3/uL (ref 0.00–0.07)
Basophils Absolute: 0.1 10*3/uL (ref 0.0–0.1)
Basophils Relative: 1 %
Eosinophils Absolute: 0.7 10*3/uL — ABNORMAL HIGH (ref 0.0–0.5)
Eosinophils Relative: 6 %
HCT: 41.2 % (ref 39.0–52.0)
Hemoglobin: 13.3 g/dL (ref 13.0–17.0)
Immature Granulocytes: 0 %
Lymphocytes Relative: 23 %
Lymphs Abs: 2.5 10*3/uL (ref 0.7–4.0)
MCH: 29.8 pg (ref 26.0–34.0)
MCHC: 32.3 g/dL (ref 30.0–36.0)
MCV: 92.2 fL (ref 80.0–100.0)
Monocytes Absolute: 0.9 10*3/uL (ref 0.1–1.0)
Monocytes Relative: 8 %
Neutro Abs: 7.1 10*3/uL (ref 1.7–7.7)
Neutrophils Relative %: 62 %
Platelets: 366 10*3/uL (ref 150–400)
RBC: 4.47 MIL/uL (ref 4.22–5.81)
RDW: 15.6 % — ABNORMAL HIGH (ref 11.5–15.5)
WBC: 11.3 10*3/uL — ABNORMAL HIGH (ref 4.0–10.5)
nRBC: 0 % (ref 0.0–0.2)

## 2018-09-14 ENCOUNTER — Other Ambulatory Visit (HOSPITAL_COMMUNITY)
Admission: RE | Admit: 2018-09-14 | Discharge: 2018-09-14 | Disposition: A | Discharge status: Home / Self Care | Payer: BC Managed Care – PPO | Source: Ambulatory Visit | Attending: Surgery | Admitting: Surgery

## 2018-09-14 DIAGNOSIS — Z1159 Encounter for screening for other viral diseases: Secondary | ICD-10-CM | POA: Diagnosis present

## 2018-09-15 LAB — NOVEL CORONAVIRUS, NAA (HOSP ORDER, SEND-OUT TO REF LAB; TAT 18-24 HRS): SARS-CoV-2, NAA: NOT DETECTED

## 2018-09-18 ENCOUNTER — Other Ambulatory Visit: Payer: Self-pay

## 2018-09-18 ENCOUNTER — Encounter (HOSPITAL_BASED_OUTPATIENT_CLINIC_OR_DEPARTMENT_OTHER): Payer: Self-pay | Admitting: Certified Registered"

## 2018-09-18 ENCOUNTER — Ambulatory Visit (HOSPITAL_BASED_OUTPATIENT_CLINIC_OR_DEPARTMENT_OTHER)
Admission: RE | Admit: 2018-09-18 | Discharge: 2018-09-18 | Disposition: A | Discharge status: Home / Self Care | Payer: BC Managed Care – PPO | Attending: Surgery | Admitting: Surgery

## 2018-09-18 ENCOUNTER — Ambulatory Visit (HOSPITAL_BASED_OUTPATIENT_CLINIC_OR_DEPARTMENT_OTHER): Payer: BC Managed Care – PPO | Admitting: Certified Registered"

## 2018-09-18 ENCOUNTER — Encounter (HOSPITAL_BASED_OUTPATIENT_CLINIC_OR_DEPARTMENT_OTHER)
Admission: RE | Disposition: A | Discharge status: Home / Self Care | Payer: Self-pay | Source: Home / Self Care | Attending: Surgery

## 2018-09-18 DIAGNOSIS — L732 Hidradenitis suppurativa: Secondary | ICD-10-CM | POA: Insufficient documentation

## 2018-09-18 DIAGNOSIS — Z79899 Other long term (current) drug therapy: Secondary | ICD-10-CM | POA: Diagnosis not present

## 2018-09-18 DIAGNOSIS — I10 Essential (primary) hypertension: Secondary | ICD-10-CM | POA: Insufficient documentation

## 2018-09-18 HISTORY — PX: HYDRADENITIS EXCISION: SHX5243

## 2018-09-18 HISTORY — DX: Unspecified osteoarthritis, unspecified site: M19.90

## 2018-09-18 SURGERY — EXCISION, HIDRADENITIS, AXILLA
Anesthesia: General | Site: Axilla | Laterality: Bilateral

## 2018-09-18 MED ORDER — BUPIVACAINE-EPINEPHRINE 0.25% -1:200000 IJ SOLN
INTRAMUSCULAR | Status: DC | PRN
  Administered 2018-09-18: 40 mL

## 2018-09-18 MED ORDER — CELECOXIB 200 MG PO CAPS
200.0000 mg | ORAL_CAPSULE | ORAL | Status: AC
  Administered 2018-09-18: 13:00:00 200 mg via ORAL

## 2018-09-18 MED ORDER — DEXAMETHASONE SODIUM PHOSPHATE 4 MG/ML IJ SOLN
INTRAMUSCULAR | Status: DC | PRN
  Administered 2018-09-18: 10 mg via INTRAVENOUS
  Administered 2018-09-18: 4 mg via INTRAVENOUS

## 2018-09-18 MED ORDER — IBUPROFEN 800 MG PO TABS: 800.0000 mg | ORAL_TABLET | Freq: Three times a day (TID) | ORAL | 0 refills | Status: DC | PRN

## 2018-09-18 MED ORDER — MIDAZOLAM HCL 2 MG/2ML IJ SOLN
1.0000 mg | INTRAMUSCULAR | Status: DC | PRN
  Administered 2018-09-18: 2 mg via INTRAVENOUS

## 2018-09-18 MED ORDER — GABAPENTIN 300 MG PO CAPS
ORAL_CAPSULE | ORAL | Status: AC
  Filled 2018-09-18: qty 1

## 2018-09-18 MED ORDER — GABAPENTIN 300 MG PO CAPS
300.0000 mg | ORAL_CAPSULE | ORAL | Status: AC
  Administered 2018-09-18: 300 mg via ORAL

## 2018-09-18 MED ORDER — CELECOXIB 200 MG PO CAPS
ORAL_CAPSULE | ORAL | Status: AC
  Filled 2018-09-18: qty 1

## 2018-09-18 MED ORDER — CHLORHEXIDINE GLUCONATE CLOTH 2 % EX PADS: 6.0000 | MEDICATED_PAD | Freq: Once | CUTANEOUS | Status: DC

## 2018-09-18 MED ORDER — ACETAMINOPHEN 500 MG PO TABS
1000.0000 mg | ORAL_TABLET | ORAL | Status: AC
  Administered 2018-09-18: 13:00:00 1000 mg via ORAL

## 2018-09-18 MED ORDER — LACTATED RINGERS IV SOLN
INTRAVENOUS | Status: DC
  Administered 2018-09-18: 13:00:00 via INTRAVENOUS

## 2018-09-18 MED ORDER — DOXYCYCLINE HYCLATE 50 MG PO CAPS: 100.0000 mg | ORAL_CAPSULE | Freq: Two times a day (BID) | ORAL | 0 refills | Status: DC

## 2018-09-18 MED ORDER — 0.9 % SODIUM CHLORIDE (POUR BTL) OPTIME
TOPICAL | Status: DC | PRN
  Administered 2018-09-18: 1000 mL

## 2018-09-18 MED ORDER — OXYCODONE HCL 5 MG PO TABS: 5.0000 mg | ORAL_TABLET | Freq: Four times a day (QID) | ORAL | 0 refills | Status: DC | PRN

## 2018-09-18 MED ORDER — SCOPOLAMINE 1 MG/3DAYS TD PT72: 1.0000 | MEDICATED_PATCH | Freq: Once | TRANSDERMAL | Status: DC | PRN

## 2018-09-18 MED ORDER — CEFAZOLIN SODIUM-DEXTROSE 2-4 GM/100ML-% IV SOLN
2.0000 g | INTRAVENOUS | Status: AC
  Administered 2018-09-18: 2 g via INTRAVENOUS

## 2018-09-18 MED ORDER — LIDOCAINE HCL (CARDIAC) PF 100 MG/5ML IV SOSY
PREFILLED_SYRINGE | INTRAVENOUS | Status: DC | PRN
  Administered 2018-09-18: 60 mg via INTRAVENOUS

## 2018-09-18 MED ORDER — FENTANYL CITRATE (PF) 100 MCG/2ML IJ SOLN
50.0000 ug | INTRAMUSCULAR | Status: AC | PRN
  Administered 2018-09-18 (×4): 50 ug via INTRAVENOUS

## 2018-09-18 MED ORDER — MIDAZOLAM HCL 2 MG/2ML IJ SOLN
INTRAMUSCULAR | Status: AC
  Filled 2018-09-18: qty 2

## 2018-09-18 MED ORDER — HYDROMORPHONE HCL 1 MG/ML IJ SOLN: 0.2500 mg | INTRAMUSCULAR | Status: DC | PRN

## 2018-09-18 MED ORDER — FENTANYL CITRATE (PF) 100 MCG/2ML IJ SOLN
INTRAMUSCULAR | Status: AC
  Filled 2018-09-18: qty 2

## 2018-09-18 MED ORDER — MEPERIDINE HCL 25 MG/ML IJ SOLN: 6.2500 mg | INTRAMUSCULAR | Status: DC | PRN

## 2018-09-18 MED ORDER — PROPOFOL 10 MG/ML IV BOLUS
INTRAVENOUS | Status: DC | PRN
  Administered 2018-09-18: 100 mg via INTRAVENOUS
  Administered 2018-09-18: 150 mg via INTRAVENOUS

## 2018-09-18 MED ORDER — ACETAMINOPHEN 500 MG PO TABS
ORAL_TABLET | ORAL | Status: AC
  Filled 2018-09-18: qty 2

## 2018-09-18 MED ORDER — SUCCINYLCHOLINE CHLORIDE 20 MG/ML IJ SOLN
INTRAMUSCULAR | Status: DC | PRN
  Administered 2018-09-18: 100 mg via INTRAVENOUS

## 2018-09-18 MED ORDER — CEFAZOLIN SODIUM-DEXTROSE 2-4 GM/100ML-% IV SOLN
INTRAVENOUS | Status: AC
  Filled 2018-09-18: qty 100

## 2018-09-18 MED ORDER — ONDANSETRON HCL 4 MG/2ML IJ SOLN: 4.0000 mg | Freq: Once | INTRAMUSCULAR | Status: DC | PRN

## 2018-09-18 SURGICAL SUPPLY — 36 items
BLADE SURG 15 STRL LF DISP TIS (BLADE) ×1 IMPLANT
BLADE SURG 15 STRL SS (BLADE) ×1
CANISTER SUCT 1200ML W/VALVE (MISCELLANEOUS) ×2 IMPLANT
CHLORAPREP W/TINT 26 (MISCELLANEOUS) ×4 IMPLANT
COVER BACK TABLE REUSABLE LG (DRAPES) ×2 IMPLANT
COVER MAYO STAND REUSABLE (DRAPES) ×2 IMPLANT
COVER WAND RF STERILE (DRAPES) IMPLANT
DECANTER SPIKE VIAL GLASS SM (MISCELLANEOUS) ×2 IMPLANT
DERMABOND ADVANCED (GAUZE/BANDAGES/DRESSINGS)
DERMABOND ADVANCED .7 DNX12 (GAUZE/BANDAGES/DRESSINGS) IMPLANT
DRAPE LAPAROSCOPIC ABDOMINAL (DRAPES) ×2 IMPLANT
DRAPE LAPAROTOMY 100X72 PEDS (DRAPES) ×2 IMPLANT
DRAPE UTILITY XL STRL (DRAPES) ×2 IMPLANT
ELECT COATED BLADE 2.86 ST (ELECTRODE) ×2 IMPLANT
ELECT REM PT RETURN 9FT ADLT (ELECTROSURGICAL) ×2
ELECTRODE REM PT RTRN 9FT ADLT (ELECTROSURGICAL) ×1 IMPLANT
GLOVE BIOGEL PI IND STRL 8 (GLOVE) ×1 IMPLANT
GLOVE BIOGEL PI INDICATOR 8 (GLOVE) ×1
GLOVE ECLIPSE 8.0 STRL XLNG CF (GLOVE) ×2 IMPLANT
GOWN STRL REUS W/ TWL LRG LVL3 (GOWN DISPOSABLE) ×2 IMPLANT
GOWN STRL REUS W/TWL LRG LVL3 (GOWN DISPOSABLE) ×2
NEEDLE HYPO 25X1 1.5 SAFETY (NEEDLE) ×2 IMPLANT
NS IRRIG 1000ML POUR BTL (IV SOLUTION) ×2 IMPLANT
PACK BASIN DAY SURGERY FS (CUSTOM PROCEDURE TRAY) ×2 IMPLANT
PENCIL BUTTON HOLSTER BLD 10FT (ELECTRODE) ×2 IMPLANT
SLEEVE SCD COMPRESS KNEE MED (MISCELLANEOUS) ×2 IMPLANT
SPONGE LAP 4X18 RFD (DISPOSABLE) ×2 IMPLANT
STAPLER VISISTAT 35W (STAPLE) IMPLANT
SUT MNCRL AB 3-0 PS2 18 (SUTURE) ×4 IMPLANT
SUT MON AB 4-0 PC3 18 (SUTURE) IMPLANT
SUT VIC AB 2-0 SH 18 (SUTURE) ×4 IMPLANT
SUT VICRYL 3-0 CR8 SH (SUTURE) IMPLANT
SYR CONTROL 10ML LL (SYRINGE) ×2 IMPLANT
TOWEL GREEN STERILE FF (TOWEL DISPOSABLE) ×4 IMPLANT
TUBE CONNECTING 20X1/4 (TUBING) ×2 IMPLANT
YANKAUER SUCT BULB TIP NO VENT (SUCTIONS) ×2 IMPLANT

## 2018-09-18 NOTE — Op Note (Signed)
Preoperative diagnosis: Bilateral hidradenitis axillaris  Postoperative diagnosis: Same  Procedure excision of bilateral axillary hidradenitis measuring 6 cm x 3 cm with primary closure  Surgeon: Thomas Cornett, MD  Anesthesia: LMA with local  EBL: 30 cc  Drains: None  Specimen: Skin from bilateral axilla to pathology  IV fluids: Per anesthesia record  Indications for procedure: Patient presents for excision of complex bilateral axillary hidradenitis.  He has had this for many years and is now beginning to cause problems with recurrent attacks and drainage.  That also causes pain and has limited his employment options.  He desires excision for the above.  Risk of bleeding, infection, scarring, decreased arm motion, chronic pain, breakdown of wound requiring complex wound care, death, DVT, numbness, cardiovascular event, reaction to medications need for the procedures discussed.  Medical management discussed.  He agreed to proceed.  Description of procedure: The patient was met in the holding area and questions were answered.  He was taken back the operating placed upon upon the operating room table.  After induction of general esthesia both axillary regions the chest area was prepped and draped in sterile fashion timeout was done and he received preoperative antibiotics.  The right side was done first.  The area measured 6 x 3 cm.  There is some sinus tracts but no active infection.  A curvilinear incision was made in the right axilla after infiltration of the area with 0.25% Sensorcaine local.  All skin and subcutaneous disease was excised back to grossly healthy tissue.  After irrigation ensuring hemostasis with cautery was wound irrigated again.  It was then closed in layers using 2-0 Vicryl and 3-0 Monocryl in a subcuticular fashion in a vertical fashion to hopefully impact range of motion less.  Left side was done.  It measures 6 x 3 cm.  The skin was excised down to healthy tissue.   Hemostasis achieved with cautery.  The wound was irrigated.  I mobilized the skin to facilitate closure in a vertical fashion.  This was done with interrupted 2-0 Vicryl and 3-0 Monocryl.  Dermabond applied.  All final counts found to be correct.  Dry dressings placed.  The patient was awoke extubated and taken to recovery in satisfactory condition. 

## 2018-09-18 NOTE — Transfer of Care (Signed)
Immediate Anesthesia Transfer of Care Note  Patient: Mark [name]  Procedure(s) Performed: EXCISION BILATERAL AXILLARY HIDRADENITIS (Bilateral Axilla)  Patient Location: PACU  Anesthesia Type:General  Level of Consciousness: awake, alert , oriented and patient cooperative  Airway & Oxygen Therapy: Patient Spontanous Breathing and Patient connected to nasal cannula oxygen  Post-op Assessment: Report given to RN and Post -op Vital signs reviewed and stable  Post vital signs: Reviewed and stable  Last Vitals:  Vitals Value Taken Time  BP    Temp    Pulse    Resp 21 09/18/18 1638  SpO2    Vitals shown include unvalidated device data.  Last Pain:  Vitals:   09/18/18 1245  TempSrc: Oral  PainSc: 7       Patients Stated Pain Goal: 3 (32/99/24 2683)  Complications: No apparent anesthesia complications

## 2018-09-18 NOTE — Anesthesia Procedure Notes (Signed)
Procedure Name: Intubation Date/Time: 09/18/2018 3:23 PM Performed by: Signe Colt, CRNA Pre-anesthesia Checklist: Patient identified, Emergency Drugs available, Suction available and Patient being monitored Patient Re-evaluated:Patient Re-evaluated prior to induction Oxygen Delivery Method: Circle system utilized Preoxygenation: Pre-oxygenation with 100% oxygen Induction Type: IV induction Ventilation: Mask ventilation without difficulty Laryngoscope Size: Mac and 3 Grade View: Grade II Tube type: Oral Tube size: 7.0 mm Number of attempts: 1 Airway Equipment and Method: Stylet and Oral airway Placement Confirmation: ETT inserted through vocal cords under direct vision,  positive ETCO2 and breath sounds checked- equal and bilateral Secured at: 21 cm Tube secured with: Tape Dental Injury: Teeth and Oropharynx as per pre-operative assessment

## 2018-09-18 NOTE — Discharge Instructions (Signed)
GENERAL SURGERY: POST OP INSTRUCTIONS ° °###################################################################### ° °EAT °Gradually transition to a high fiber diet with a fiber supplement over the next few weeks after discharge.  Start with a pureed / full liquid diet (see below) ° °WALK °Walk an hour a day.  Control your pain to do that.   ° °CONTROL PAIN °Control pain so that you can walk, sleep, tolerate sneezing/coughing, go up/down stairs. ° °HAVE A BOWEL MOVEMENT DAILY °Keep your bowels regular to avoid problems.  OK to try a laxative to override constipation.  OK to use an antidairrheal to slow down diarrhea.  Call if not better after 2 tries ° °CALL IF YOU HAVE PROBLEMS/CONCERNS °Call if you are still struggling despite following these instructions. °Call if you have concerns not answered by these instructions ° °###################################################################### ° ° ° °1. DIET: Follow a light bland diet the first 24 hours after arrival home, such as soup, liquids, crackers, etc.  Be sure to include lots of fluids daily.  Avoid fast food or heavy meals as your are more likely to get nauseated.   °2. Take your usually prescribed home medications unless otherwise directed. °3. PAIN CONTROL: °a. Pain is best controlled by a usual combination of three different methods TOGETHER: °i. Ice/Heat °ii. Over the counter pain medication °iii. Prescription pain medication °b. Most patients will experience some swelling and bruising around the incisions.  Ice packs or heating pads (30-60 minutes up to 6 times a day) will help. Use ice for the first few days to help decrease swelling and bruising, then switch to heat to help relax tight/sore spots and speed recovery.  Some people prefer to use ice alone, heat alone, alternating between ice & heat.  Experiment to what works for you.  Swelling and bruising can take several weeks to resolve.   °c. It is helpful to take an over-the-counter pain medication  regularly for the first few weeks.  Choose one of the following that works best for you: °i. Naproxen (Aleve, etc)  Two 220mg tabs twice a day °ii. Ibuprofen (Advil, etc) Three 200mg tabs four times a day (every meal & bedtime) °iii. Acetaminophen (Tylenol, etc) 500-650mg four times a day (every meal & bedtime) °d. A  prescription for pain medication (such as oxycodone, hydrocodone, etc) should be given to you upon discharge.  Take your pain medication as prescribed.  °i. If you are having problems/concerns with the prescription medicine (does not control pain, nausea, vomiting, rash, itching, etc), please call us [phone] to see if we need to switch you to a different pain medicine that will work better for you and/or control your side effect better. °ii. If you need a refill on your pain medication, please contact your pharmacy.  They will contact our office to request authorization. Prescriptions will not be filled after 5 pm or on week-ends. °4. Avoid getting constipated.  Between the surgery and the pain medications, it is common to experience some constipation.  Increasing fluid intake and taking a fiber supplement (such as Metamucil, Citrucel, FiberCon, MiraLax, etc) 1-2 times a day regularly will usually help prevent this problem from occurring.  A mild laxative (prune juice, Milk of Magnesia, MiraLax, etc) should be taken according to package directions if there are no bowel movements after 48 hours.   °5. Wash / shower every day.  You may shower over the dressings as they are waterproof.  Continue to shower over incision(s) after the dressing is off. °6. Remove your waterproof bandages   5 days after surgery.  You may leave the incision open to air.  You may have skin tapes (Steri Strips) covering the incision(s).  Leave them on until one week, then remove.  You may replace a dressing/Band-Aid to cover the incision for comfort if you wish.  ° ° ° ° °7. ACTIVITIES as tolerated:   °a. You may resume  regular (light) daily activities beginning the next day--such as daily self-care, walking, climbing stairs--gradually increasing activities as tolerated.  If you can walk 30 minutes without difficulty, it is safe to try more intense activity such as jogging, treadmill, bicycling, low-impact aerobics, swimming, etc. °b. Save the most intensive and strenuous activity for last such as sit-ups, heavy lifting, contact sports, etc  Refrain from any heavy lifting or straining until you are off narcotics for pain control.   °c. DO NOT PUSH THROUGH PAIN.  Let pain be your guide: If it hurts to do something, don't do it.  Pain is your body warning you to avoid that activity for another week until the pain goes down. °d. You may drive when you are no longer taking prescription pain medication, you can comfortably wear a seatbelt, and you can safely maneuver your car and apply brakes. °e. You may have sexual intercourse when it is comfortable.  °8. FOLLOW UP in our office °a. Please call [facility] at [phone] to set up an appointment to see your surgeon in the office for a follow-up appointment approximately 2-3 weeks after your surgery. °b. Make sure that you call for this appointment the day you arrive home to insure a convenient appointment time. °9. IF YOU HAVE DISABILITY OR FAMILY LEAVE FORMS, BRING THEM TO THE OFFICE FOR PROCESSING.  DO NOT GIVE THEM TO YOUR DOCTOR. ° ° °WHEN TO CALL US [phone]: °1. Poor pain control °2. Reactions / problems with new medications (rash/itching, nausea, etc)  °3. Fever over 101.5 F (38.5 C) °4. Worsening swelling or bruising °5. Continued bleeding from incision. °6. Increased pain, redness, or drainage from the incision °7. Difficulty breathing / swallowing ° ° The clinic staff is available to answer your questions during regular business hours (8:30am-5pm).  Please don’t hesitate to call and ask to speak to one of our nurses for clinical concerns.  ° If you have a medical emergency,  go to the nearest emergency room or call 911. ° A surgeon from [facility] is always on call at the hospitals ° ° °[facility] °[address], [address], [place], [place]  [address] ? °MAIN: [phone] ? TOLL FREE: 1-800-359-8415 ?  °FAX [phone] °[email or web address] ° ° °Post Anesthesia Home Care Instructions ° °Activity: °Get plenty of rest for the remainder of the day. A responsible individual must stay with you for 24 hours following the procedure.  °For the next 24 hours, DO NOT: °-Drive a car °-Operate machinery °-Drink alcoholic beverages °-Take any medication unless instructed by your physician °-Make any legal decisions or sign important papers. ° °Meals: °Start with liquid foods such as gelatin or soup. Progress to regular foods as tolerated. Avoid greasy, spicy, heavy foods. If nausea and/or vomiting occur, drink only clear liquids until the nausea and/or vomiting subsides. Call your physician if vomiting continues. ° °Special Instructions/Symptoms: °Your throat may feel dry or sore from the anesthesia or the breathing tube placed in your throat during surgery. If this causes discomfort, gargle with warm salt water. The discomfort should disappear within 24 hours. ° °  If you had a scopolamine patch placed behind your ear for the management of post- operative nausea and/or vomiting: ° °1. The medication in the patch is effective for 72 hours, after which it should be removed.  Wrap patch in a tissue and discard in the trash. Wash hands thoroughly with soap and water. °2. You may remove the patch earlier than 72 hours if you experience unpleasant side effects which may include dry mouth, dizziness or visual disturbances. °3. Avoid touching the patch. Wash your hands with soap and water after contact with the patch. °  ° °

## 2018-09-18 NOTE — Anesthesia Preprocedure Evaluation (Signed)
Anesthesia Evaluation  Patient identified by MRN, date of birth, ID band Patient awake    Reviewed: Allergy & Precautions, NPO status , Patient's Chart, lab work & pertinent test results  Airway Mallampati: I  TM Distance: >3 FB Neck ROM: Full    Dental   Pulmonary    Pulmonary exam normal        Cardiovascular hypertension, Pt. on medications Normal cardiovascular exam     Neuro/Psych    GI/Hepatic   Endo/Other    Renal/GU      Musculoskeletal   Abdominal   Peds  Hematology   Anesthesia Other Findings   Reproductive/Obstetrics                             Anesthesia Physical Anesthesia Plan  ASA: II  Anesthesia Plan: General   Post-op Pain Management:    Induction: Intravenous  PONV Risk Score and Plan: 2 and Ondansetron and Midazolam  Airway Management Planned: LMA  Additional Equipment:   Intra-op Plan:   Post-operative Plan: Extubation in OR  Informed Consent: I have reviewed the patients History and Physical, chart, labs and discussed the procedure including the risks, benefits and alternatives for the proposed anesthesia with the patient or authorized representative who has indicated his/her understanding and acceptance.       Plan Discussed with: CRNA and Surgeon  Anesthesia Plan Comments:         Anesthesia Quick Evaluation  

## 2018-09-18 NOTE — Interval H&P Note (Signed)
History and Physical Interval Note:  09/18/2018 1:56 PM  Mark [name]  has presented today for surgery, with the diagnosis of HIDRADENITIS AXILLARIS.  The various methods of treatment have been discussed with the patient and family. After consideration of risks, benefits and other options for treatment, the patient has consented to  Procedure(s): EXCISION BILATERAL AXILLARY HIDRADENITIS (Bilateral) as a surgical intervention.  The patient's history has been reviewed, patient examined, no change in status, stable for surgery.  I have reviewed the patient's chart and labs.  Questions were answered to the patient's satisfaction.     [name]

## 2018-09-19 ENCOUNTER — Encounter (HOSPITAL_BASED_OUTPATIENT_CLINIC_OR_DEPARTMENT_OTHER): Payer: Self-pay | Admitting: Surgery

## 2018-09-19 NOTE — Anesthesia Postprocedure Evaluation (Signed)
Anesthesia Post Note  Patient: Mark [name]  Procedure(s) Performed: EXCISION BILATERAL AXILLARY HIDRADENITIS (Bilateral Axilla)     Patient location during evaluation: PACU Anesthesia Type: General Level of consciousness: awake and alert Pain management: pain level controlled Vital Signs Assessment: post-procedure vital signs reviewed and stable Respiratory status: spontaneous breathing, nonlabored ventilation, respiratory function stable and patient connected to nasal cannula oxygen Cardiovascular status: blood pressure returned to baseline and stable Postop Assessment: no apparent nausea or vomiting Anesthetic complications: no    Last Vitals:  Vitals:   09/18/18 1646 09/18/18 1709  BP:  (!) 188/92  Pulse:  78  Resp: 15 18  Temp:  37 C  SpO2:  100%    Last Pain:  Vitals:   09/18/18 1709  TempSrc:   PainSc: 0-No pain                 Eldar Robitaille DAVID

## 2018-09-25 ENCOUNTER — Encounter (HOSPITAL_COMMUNITY): Payer: Self-pay | Admitting: *Deleted

## 2018-09-25 ENCOUNTER — Other Ambulatory Visit: Payer: Self-pay

## 2018-09-25 ENCOUNTER — Emergency Department (HOSPITAL_COMMUNITY)
Admission: EM | Admit: 2018-09-25 | Discharge: 2018-09-25 | Disposition: A | Discharge status: Home / Self Care | Payer: BC Managed Care – PPO | Attending: Emergency Medicine | Admitting: Emergency Medicine

## 2018-09-25 DIAGNOSIS — Y658 Other specified misadventures during surgical and medical care: Secondary | ICD-10-CM | POA: Diagnosis not present

## 2018-09-25 DIAGNOSIS — I10 Essential (primary) hypertension: Secondary | ICD-10-CM | POA: Diagnosis not present

## 2018-09-25 DIAGNOSIS — T8131XA Disruption of external operation (surgical) wound, not elsewhere classified, initial encounter: Secondary | ICD-10-CM

## 2018-09-25 LAB — CBC WITH DIFFERENTIAL/PLATELET
Abs Immature Granulocytes: 0.04 10*3/uL (ref 0.00–0.07)
Basophils Absolute: 0.1 10*3/uL (ref 0.0–0.1)
Basophils Relative: 1 %
Eosinophils Absolute: 0.8 10*3/uL — ABNORMAL HIGH (ref 0.0–0.5)
Eosinophils Relative: 7 %
HCT: 38.9 % — ABNORMAL LOW (ref 39.0–52.0)
Hemoglobin: 12.4 g/dL — ABNORMAL LOW (ref 13.0–17.0)
Immature Granulocytes: 0 %
Lymphocytes Relative: 22 %
Lymphs Abs: 2.5 10*3/uL (ref 0.7–4.0)
MCH: 29.6 pg (ref 26.0–34.0)
MCHC: 31.9 g/dL (ref 30.0–36.0)
MCV: 92.8 fL (ref 80.0–100.0)
Monocytes Absolute: 0.9 10*3/uL (ref 0.1–1.0)
Monocytes Relative: 8 %
Neutro Abs: 7.2 10*3/uL (ref 1.7–7.7)
Neutrophils Relative %: 62 %
Platelets: 386 10*3/uL (ref 150–400)
RBC: 4.19 MIL/uL — ABNORMAL LOW (ref 4.22–5.81)
RDW: 15.3 % (ref 11.5–15.5)
WBC: 11.5 10*3/uL — ABNORMAL HIGH (ref 4.0–10.5)
nRBC: 0 % (ref 0.0–0.2)

## 2018-09-25 LAB — BASIC METABOLIC PANEL
Anion gap: 9 (ref 5–15)
BUN: 14 mg/dL (ref 6–20)
CO2: 24 mmol/L (ref 22–32)
Calcium: 8.7 mg/dL — ABNORMAL LOW (ref 8.9–10.3)
Chloride: 105 mmol/L (ref 98–111)
Creatinine, Ser: 0.91 mg/dL (ref 0.61–1.24)
GFR calc Af Amer: >60 mL/min (ref >60)
GFR calc non Af Amer: >60 mL/min (ref >60)
Glucose, Bld: 125 mg/dL — ABNORMAL HIGH (ref 70–99)
Potassium: 3.5 mmol/L (ref 3.5–5.1)
Sodium: 138 mmol/L (ref 135–145)

## 2018-09-25 LAB — ABO/RH: ABO/RH(D): B POS

## 2018-09-25 LAB — TYPE AND SCREEN
ABO/RH(D): B POS
Antibody Screen: NEGATIVE

## 2018-09-25 MED ORDER — SODIUM CHLORIDE 0.9 % IV BOLUS
1000.0000 mL | Freq: Once | INTRAVENOUS | Status: AC
  Administered 2018-09-25: 08:00:00 1000 mL via INTRAVENOUS

## 2018-09-25 MED ORDER — MORPHINE SULFATE (PF) 4 MG/ML IV SOLN
4.0000 mg | Freq: Once | INTRAVENOUS | Status: AC
  Administered 2018-09-25: 4 mg via INTRAVENOUS
  Filled 2018-09-25: qty 1

## 2018-09-25 NOTE — ED Provider Notes (Signed)
[facility] Provider Note   CSN: [id number] Arrival date & time: 09/25/18  0732     History   Chief Complaint Chief Complaint  Patient presents with  . Post-op Problem    HPI Mark [name] is a 48 y.o. male.     HPI   48yM with bleeding from R axilla. S/p excision bilateral axilla for hidradenitis on 6/17. Noticed sutures opening R axilla yesterday but no significant bleeding. Acute onset of what he describes as severe bleeding around 0700 today. No blood thinners.   Past Medical History:  Diagnosis Date  . Arthritis    RA  . Hip fracture ([place])   . Hypertension     Patient Active Problem List   Diagnosis Date Noted  . Perirectal abscess 07/17/2018    Past Surgical History:  Procedure Laterality Date  . HYDRADENITIS EXCISION Bilateral 09/18/2018   Procedure: EXCISION BILATERAL AXILLARY HIDRADENITIS;  Surgeon: Erroll Luna, MD;  Location: [facility];  Service: General;  Laterality: Bilateral;  . INCISION AND DRAINAGE PERIRECTAL ABSCESS N/A 07/17/2018   Procedure: IRRIGATION AND DEBRIDEMENT PERIRECTAL ABSCESS;  Surgeon: Ralene Ok, MD;  Location: [facility];  Service: General;  Laterality: N/A;        Home Medications    Prior to Admission medications   Medication Sig Start Date End Date Taking? Authorizing Provider  acetaminophen (TYLENOL) 500 MG tablet Take 500 mg by mouth every 6 (six) hours as needed.    [provider]  doxycycline (VIBRAMYCIN) 50 MG capsule Take 2 capsules (100 mg total) by mouth 2 (two) times daily. 09/18/18   Cornett, Marcello Moores, MD  ENBREL SURECLICK 50 MG/ML injection Inject 50 mg into the skin every Saturday.  06/15/18   [provider]  ibuprofen (ADVIL) 800 MG tablet Take 1 tablet (800 mg total) by mouth every 8 (eight) hours as needed. 09/18/18   Cornett, Marcello Moores, MD  ibuprofen (ADVIL,MOTRIN) 200 MG tablet Take 600 mg by mouth every 6 (six) hours as needed for fever,  headache or mild pain. For pain.    [provider]  oxyCODONE (OXY IR/ROXICODONE) 5 MG immediate release tablet Take 1 tablet (5 mg total) by mouth every 6 (six) hours as needed for severe pain. 09/18/18   Erroll Luna, MD    Family History No family history on file.  Social History Social History   Tobacco Use  . Smoking status: Never Smoker  . Smokeless tobacco: Never Used  Substance Use Topics  . Alcohol use: Yes  . Drug use: No    Frequency: 3.0 times per week     Allergies   Peanut-containing drug products   Review of Systems Review of Systems  All systems reviewed and negative, other than as noted in HPI.  Physical Exam Updated Vital Signs BP (!) 150/100   Pulse 94   Temp 98.5 F (36.9 C) (Oral)   Ht 5\' 10"  (1.778 m)   Wt 77.1 kg   SpO2 99%   BMI 24.39 kg/m   Physical Exam Vitals signs and nursing note reviewed.  Constitutional:      General: He is not in acute distress.    Appearance: He is well-developed.  HENT:     Head: Normocephalic and atraumatic.  Eyes:     General:        Right eye: No discharge.        Left eye: No discharge.     Conjunctiva/sclera: Conjunctivae normal.  Neck:  Musculoskeletal: Neck supple.  Cardiovascular:     Rate and Rhythm: Normal rate and regular rhythm.     Heart sounds: Normal heart sounds. No murmur. No friction rub. No gallop.   Pulmonary:     Effort: Pulmonary effort is normal. No respiratory distress.     Breath sounds: Normal breath sounds.  Abdominal:     General: There is no distension.     Palpations: Abdomen is soft.     Tenderness: There is no abdominal tenderness.  Musculoskeletal:     Comments: Blood soaked towel under R arm and dripping blood from it. Appeared to have some type of dressing or steri strips below it. Every time I abducted his arm there was pretty large volume bleeding from high in the axilla. I tried compressing just proximal and distal to the area but was unable to slow  it significantly. It did not seem pulsatile though. Axilla packed with handfuls of 4x4 gauze and able to control bleeding when arm tightly adducted against body. Palpable radial pulse.   Skin:    General: Skin is warm and dry.  Neurological:     Mental Status: He is alert.  Psychiatric:        Behavior: Behavior normal.        Thought Content: Thought content normal.      ED Treatments / Results  Labs (all labs ordered are listed, but only abnormal results are displayed) Labs Reviewed  BASIC METABOLIC PANEL - Abnormal; Notable for the following components:      Result Value   Glucose, Bld 125 (*)    Calcium 8.7 (*)    All other components within normal limits  CBC WITH DIFFERENTIAL/PLATELET - Abnormal; Notable for the following components:   WBC 11.5 (*)    RBC 4.19 (*)    Hemoglobin 12.4 (*)    HCT 38.9 (*)    Eosinophils Absolute 0.8 (*)    All other components within normal limits  SARS CORONAVIRUS 2 (HOSPITAL ORDER, PERFORMED IN [facility])  TYPE AND SCREEN  ABO/RH    EKG    Radiology No results found.  Procedures Procedures (including critical care time)  Medications Ordered in ED Medications - No data to display   Initial Impression / Assessment and Plan / ED Course  I have reviewed the triage vital signs and the nursing notes.  Pertinent labs & imaging results that were available during my care of the patient were reviewed by me and considered in my medical decision making (see chart for details).        48yM with bleeding from R axilla s/p incision for hidradenitis about a week ago. Bleeding too brisk for me to clearly identify source beyond this. Has radial pulse. Axilla packed with 4x4 and controlled with pressure. Surgery paged. IV established. Basic labs/T&S/NPO in event that needs OR.   9:33 AM Pt seen by surgery and cleared. Since my initial assessment the bleeding has been controlled with pressure. I'm concerned with the rate of  bleeding he had on my initial assessment and that with movement he'll disrupt any clot that may have formed with significant bleeding again. Will ACE wrap his arm in adduction against his chest and instruct him to keep this on for the rest of the day. Needs to limit activity with RUE the best he can after this. Return to ED if has significant bleeding again. Follow-up with surgery as instructed otherwise.   Final Clinical Impressions(s) / ED Diagnoses  Final diagnoses:  Dehiscence of surgical wound, initial encounter    ED Discharge Orders    None       Raeford Razor, MD 09/25/18 [phone]

## 2018-09-25 NOTE — ED Notes (Signed)
Bleeding controlled.

## 2018-09-25 NOTE — ED Triage Notes (Signed)
Pt states he started bleeding from surgical sit in R armpit at 0700.  Yesterday he called md b/c it seemed the sutures were opening, but did not notice bleeding.  Blood soaked towel and blood "gushing" out.

## 2018-09-25 NOTE — Progress Notes (Signed)
Pt seen due to bleeding from right axillary incision after excision of bilateral hidradenitis 1 week ago  Developed bleeding and asked to see Bleeding had stopped upon my arrival Both wounds have separated but no active bleeding noted and no expanding hematoma noted   HGB stable at 12   Recommend covering with dry gaze and change daily    Avoid any lifting for now    Pt has follow up next week   More than likely had a seroma that drained as wound separated

## 2019-04-08 ENCOUNTER — Encounter (HOSPITAL_COMMUNITY): Payer: Self-pay

## 2019-04-08 ENCOUNTER — Other Ambulatory Visit: Payer: Self-pay

## 2019-04-08 ENCOUNTER — Encounter (HOSPITAL_COMMUNITY): Payer: Self-pay | Admitting: *Deleted

## 2019-04-08 ENCOUNTER — Ambulatory Visit (INDEPENDENT_AMBULATORY_CARE_PROVIDER_SITE_OTHER)
Admission: EM | Admit: 2019-04-08 | Discharge: 2019-04-08 | Disposition: A | Discharge status: Home / Self Care | Payer: BC Managed Care – PPO | Source: Home / Self Care

## 2019-04-08 ENCOUNTER — Emergency Department (HOSPITAL_COMMUNITY)
Admission: EM | Admit: 2019-04-08 | Discharge: 2019-04-08 | Disposition: A | Discharge status: Home / Self Care | Payer: BC Managed Care – PPO | Attending: Emergency Medicine | Admitting: Emergency Medicine

## 2019-04-08 DIAGNOSIS — I10 Essential (primary) hypertension: Secondary | ICD-10-CM

## 2019-04-08 DIAGNOSIS — Z5321 Procedure and treatment not carried out due to patient leaving prior to being seen by health care provider: Secondary | ICD-10-CM | POA: Diagnosis not present

## 2019-04-08 DIAGNOSIS — S0502XA Injury of conjunctiva and corneal abrasion without foreign body, left eye, initial encounter: Secondary | ICD-10-CM

## 2019-04-08 DIAGNOSIS — H5789 Other specified disorders of eye and adnexa: Secondary | ICD-10-CM | POA: Diagnosis not present

## 2019-04-08 MED ORDER — AMLODIPINE BESYLATE 10 MG PO TABS: 10.0000 mg | ORAL_TABLET | Freq: Every day | ORAL | 0 refills | Status: DC

## 2019-04-08 MED ORDER — FLUORESCEIN SODIUM 1 MG OP STRP
ORAL_STRIP | OPHTHALMIC | Status: AC
  Filled 2019-04-08: qty 1

## 2019-04-08 MED ORDER — EYE WASH OPHTH SOLN
OPHTHALMIC | Status: AC
  Filled 2019-04-08: qty 118

## 2019-04-08 MED ORDER — PRED MILD 0.12 % OP SUSP: 1.0000 [drp] | Freq: Three times a day (TID) | OPHTHALMIC | 0 refills | Status: DC

## 2019-04-08 MED ORDER — ERYTHROMYCIN 5 MG/GM OP OINT: TOPICAL_OINTMENT | OPHTHALMIC | 0 refills | Status: DC

## 2019-04-08 NOTE — ED Triage Notes (Addendum)
Pt c/o eye irritation, tearing and feeling of foreign body to left eye x4 days. Eye with redness. Denies fever, chills, or URI sx, denies any ear congestion/pain. States eye is "sore" around the socket. Took ibuprofen at approx 0500 with some improvement.  Normally wears reading glasses only.Pt denies known injury to eye. Feels like "something is floating or sticking in it."  Elevated bp noted; pt states he stopped bp meds at own discretion this past summer.

## 2019-04-08 NOTE — ED Triage Notes (Signed)
Pt reports left eye swelling and irritation, no relief with otc eye drops.

## 2019-04-08 NOTE — ED Provider Notes (Signed)
[place]    CSN: [id number] Arrival date & time: 04/08/19  0802      History   Chief Complaint Chief Complaint  Patient presents with  . Eye Problem    HPI Mark [name] is a 49 y.o. male.   HPI  Patient has RA and is on chronic Embrel. He complains of left eye irritation sore, red, tearing x few days. Recent eye exam within the last 90 days which was normal. No crusting or purulent discharge. Non contact lens wearer. He is experiencing pain with blinking and the sensation of irritation located to the left upper orbit.  Hypertension- Patient prescribed antihypertensive medication. Stopped taking medication independently as he did not like the way medications made him feel. He does not check his blood pressure. He was unaware that his blood pressure had been as elevated as it was today. He denies chest pain, shortness of breath or new weakness. Past Medical History:  Diagnosis Date  . Arthritis    RA  . Hip fracture ([place])   . Hypertension     Patient Active Problem List   Diagnosis Date Noted  . Perirectal abscess 07/17/2018    Past Surgical History:  Procedure Laterality Date  . HYDRADENITIS EXCISION Bilateral 09/18/2018   Procedure: EXCISION BILATERAL AXILLARY HIDRADENITIS;  Surgeon: Erroll Luna, MD;  Location: [facility];  Service: General;  Laterality: Bilateral;  . INCISION AND DRAINAGE PERIRECTAL ABSCESS N/A 07/17/2018   Procedure: IRRIGATION AND DEBRIDEMENT PERIRECTAL ABSCESS;  Surgeon: Ralene Ok, MD;  Location: [facility];  Service: General;  Laterality: N/A;       Home Medications    Prior to Admission medications   Medication Sig Start Date End Date Taking? Authorizing Provider  ENBREL SURECLICK 50 MG/ML injection Inject 50 mg into the skin every Saturday.  06/15/18  Yes [provider]  ibuprofen (ADVIL) 800 MG tablet Take 1 tablet (800 mg total) by mouth every 8 (eight) hours as needed. 09/18/18  Yes Cornett,  Marcello Moores, MD  acetaminophen (TYLENOL) 500 MG tablet Take 500 mg by mouth every 6 (six) hours as needed.    [provider]  doxycycline (VIBRAMYCIN) 50 MG capsule Take 2 capsules (100 mg total) by mouth 2 (two) times daily. 09/18/18   Cornett, Marcello Moores, MD  ibuprofen (ADVIL,MOTRIN) 200 MG tablet Take 600 mg by mouth every 6 (six) hours as needed for fever, headache or mild pain. For pain.    [provider]  oxyCODONE (OXY IR/ROXICODONE) 5 MG immediate release tablet Take 1 tablet (5 mg total) by mouth every 6 (six) hours as needed for severe pain. 09/18/18   Erroll Luna, MD    Family History Family History  Problem Relation Age of Onset  . Heart failure Mother   . Aneurysm Father     Social History Social History   Tobacco Use  . Smoking status: Never Smoker  . Smokeless tobacco: Never Used  Substance Use Topics  . Alcohol use: Not Currently  . Drug use: No    Frequency: 3.0 times per week     Allergies   Peanut-containing drug products   Review of Systems Review of Systems Pertinent negatives listed in HPI Physical Exam Triage Vital Signs ED Triage Vitals  Enc Vitals Group     BP 04/08/19 0822 (!) 171/112     Pulse Rate 04/08/19 0822 81     Resp 04/08/19 0822 18     Temp 04/08/19 0822 98.2 F (36.8 C)  Temp Source 04/08/19 0822 Oral     SpO2 04/08/19 0822 100 %     Weight --      Height --      Head Circumference --      Peak Flow --      Pain Score 04/08/19 0816 8     Pain Loc --      Pain Edu? --      Excl. in GC? --    No data found.  Updated Vital Signs BP (!) 171/112 (BP Location: Right Arm) Comment: Provider notified  Pulse 81   Temp 98.2 F (36.8 C) (Oral)   Resp 18   SpO2 100%   Visual Acuity Right Eye Distance:   Left Eye Distance:   Bilateral Distance:    Right Eye Near:   Left Eye Near:    Bilateral Near:     Physical Exam Eyes:     General: Lids are normal.        Left eye: No foreign body or discharge.      Pupils: Pupils are equal, round, and reactive to light.     Left eye: Pupil is round, reactive and not sluggish. Corneal abrasion and fluorescein uptake present.   Cardiovascular:     Rate and Rhythm: Regular rhythm.     Heart sounds: Normal heart sounds.  Pulmonary:     Effort: Pulmonary effort is normal.     Breath sounds: Normal breath sounds and air entry.  Skin:    General: Skin is warm.  Neurological:     General: No focal deficit present.  Psychiatric:        Mood and Affect: Mood normal.      UC Treatments / Results  Labs (all labs ordered are listed, but only abnormal results are displayed) Labs Reviewed - No data to display  EKG   Radiology No results found.  Procedures Procedures (including critical care time)  Medications Ordered in UC Medications - No data to display  Initial Impression / Assessment and Plan / UC Course  I have reviewed the triage vital signs and the nursing notes.  Pertinent labs & imaging results that were available during my care of the patient were reviewed by me and considered in my medical decision making (see chart for details).   Corneal abrasion, left eye consistent with findings with fluorescein staining of left.  Will initiate erythromycin ointment as well as prednisolone eyedrops for pain.  Patient advised to follow-up with ophthalmologist for further evaluation and management Red flags discussed.  Accelerated Hypertension, with Norvasc 5 mg once daily.  Encouraged to follow-up with primary care BP monitoring and possible to achieve optimal blood pressure.  Final Clinical Impressions(s) / UC Diagnoses   Final diagnoses:  Abrasion of left cornea, initial encounter  Essential hypertension   Discharge Instructions   None    ED Prescriptions    Medication Sig Dispense Auth. Provider   amLODipine (NORVASC) 10 MG tablet Take 1 tablet (10 mg total) by mouth daily. 30 tablet Bing Neighbors, FNP   prednisoLONE acetate  (PRED MILD) 0.12 % ophthalmic suspension Place 1 drop into the left eye 3 (three) times daily for 3 days. 0.5 mL Bing Neighbors, FNP   erythromycin ophthalmic ointment Place a 1/2 inch ribbon of ointment into the lower eyelid. Left eye x 7 days 1 g Bing Neighbors, FNP     PDMP not reviewed this encounter.   Bing Neighbors, FNP 04/10/19 1928

## 2019-04-09 ENCOUNTER — Telehealth (HOSPITAL_COMMUNITY): Payer: Self-pay | Admitting: Family Medicine

## 2019-04-09 MED ORDER — PREDNISOLONE ACETATE 1 % OP SUSP: 1.0000 [drp] | Freq: Three times a day (TID) | OPHTHALMIC | 0 refills | Status: AC

## 2019-04-09 NOTE — Telephone Encounter (Signed)
Needs prednisolone opthalamic med changed. No pharmacy has 0.12% strength. Dahlia Byes NP orders prednisolone 1% for patient, will be sent to [facility] at [address].

## 2019-07-25 ENCOUNTER — Ambulatory Visit (HOSPITAL_COMMUNITY)
Admission: AD | Admit: 2019-07-25 | Discharge: 2019-07-25 | Disposition: A | Discharge status: Home / Self Care | Payer: BC Managed Care – PPO | Attending: Psychiatry | Admitting: Psychiatry

## 2019-07-25 ENCOUNTER — Telehealth (HOSPITAL_COMMUNITY): Payer: Self-pay | Admitting: Psychiatry

## 2019-07-25 NOTE — Telephone Encounter (Signed)
D:  Pt phoned inquiring about MH-IOP.  A:  Case manager oriented pt and answered his questions.  Encouraged pt to verify his benefits through his insurance company.  Pt states he will do that and call case manager back.  R:  Pt receptive.

## 2019-11-09 ENCOUNTER — Encounter (HOSPITAL_COMMUNITY): Payer: Self-pay | Admitting: Emergency Medicine

## 2019-11-09 ENCOUNTER — Emergency Department (HOSPITAL_COMMUNITY): Payer: BC Managed Care – PPO

## 2019-11-09 ENCOUNTER — Emergency Department (HOSPITAL_COMMUNITY)
Admission: EM | Admit: 2019-11-09 | Discharge: 2019-11-09 | Disposition: A | Discharge status: Home / Self Care | Payer: BC Managed Care – PPO | Attending: Emergency Medicine | Admitting: Emergency Medicine

## 2019-11-09 ENCOUNTER — Other Ambulatory Visit: Payer: Self-pay

## 2019-11-09 DIAGNOSIS — L02214 Cutaneous abscess of groin: Secondary | ICD-10-CM | POA: Insufficient documentation

## 2019-11-09 DIAGNOSIS — I10 Essential (primary) hypertension: Secondary | ICD-10-CM | POA: Insufficient documentation

## 2019-11-09 DIAGNOSIS — Z79899 Other long term (current) drug therapy: Secondary | ICD-10-CM | POA: Diagnosis not present

## 2019-11-09 DIAGNOSIS — L0291 Cutaneous abscess, unspecified: Secondary | ICD-10-CM

## 2019-11-09 DIAGNOSIS — Z91010 Allergy to peanuts: Secondary | ICD-10-CM | POA: Insufficient documentation

## 2019-11-09 HISTORY — DX: Rheumatoid arthritis, unspecified: M06.9

## 2019-11-09 MED ORDER — DOXYCYCLINE HYCLATE 100 MG PO CAPS: 100.0000 mg | ORAL_CAPSULE | Freq: Two times a day (BID) | ORAL | 0 refills | Status: AC

## 2019-11-09 MED ORDER — LIDOCAINE HCL (PF) 1 % IJ SOLN
5.0000 mL | Freq: Once | INTRAMUSCULAR | Status: AC
  Administered 2019-11-09: 5 mL
  Filled 2019-11-09: qty 5

## 2019-11-09 NOTE — Discharge Instructions (Signed)
You have been seen here for abscess on your scrotum.  I&D was performed and is draining properly.  I prescribed you antibiotics please take as prescribed.  Please be aware that this can make you more susceptible to sunburn please wear sunscreen and a hat to protect your face.  I also advised that you wash the area with cool water and change dressing twice a day.  You may take over-the-counter pain medications like ibuprofen or Tylenol as needed for pain please follow dosing on back of bottle.  I have given you contact information for urology I want you to call them tomorrow and schedule an appointment with them for Friday.  I want to come back to emergency department if you develop fever, chills, increasing swelling in your scrotum, increased redness, increasing pain, discharge, chest pain, shortness of breath, abdominal pain is the symptoms require further evaluation management.

## 2019-11-09 NOTE — ED Provider Notes (Signed)
[facility] EMERGENCY DEPARTMENT Provider Note   CSN: [id number] Arrival date & time: 11/09/19  0749     History Chief Complaint  Patient presents with  . Abscess    Mark [name] is a 49 y.o. male.  HPI   Patient presents to the emergency department with chief complaint of abscess on his left groin.  Patient explains he saw the abscess on Monday and has increasingly gotten larger.  He states that it is very painful especially when he touches it.  He has tried [employer] and warm compresses without any relief.  He states while he was sitting in the emergency room it popped and he had purulent discharge come out of it.  He denies any urinary symptoms, testicular pain, fever, chills.  He has a history of abscesses last one was on his buttocks as well as in his armpit he was seen by [facility] [place] who performed the surgeries.  Patient has significant medical history of arthritis, hip fracture, hypertension.  He does not take any medication on a daily basis.  Patient denies headache, fever, chills, shortness of breath, chest pain, abdominal pain, dysuria, pedal edema. Past Medical History:  Diagnosis Date  . Arthritis    RA  . Hip fracture ([facility])   . Hypertension   . Rheumatoid arthritis [facility])     Patient Active Problem List   Diagnosis Date Noted  . Perirectal abscess 07/17/2018    Past Surgical History:  Procedure Laterality Date  . HYDRADENITIS EXCISION Bilateral 09/18/2018   Procedure: EXCISION BILATERAL AXILLARY HIDRADENITIS;  Surgeon: Harriette Bouillon, MD;  Location: [facility];  Service: General;  Laterality: Bilateral;  . INCISION AND DRAINAGE PERIRECTAL ABSCESS N/A 07/17/2018   Procedure: IRRIGATION AND DEBRIDEMENT PERIRECTAL ABSCESS;  Surgeon: Axel Filler, MD;  Location: [facility] OR;  Service: General;  Laterality: N/A;       Family History  Problem Relation Age of Onset  . Heart failure Mother   . Aneurysm Father     Social  History   Tobacco Use  . Smoking status: Never Smoker  . Smokeless tobacco: Never Used  Vaping Use  . Vaping Use: Never used  Substance Use Topics  . Alcohol use: Not Currently  . Drug use: No    Frequency: 3.0 times per week    Home Medications Prior to Admission medications   Medication Sig Start Date End Date Taking? Authorizing Provider  acetaminophen (TYLENOL) 500 MG tablet Take 500 mg by mouth every 6 (six) hours as needed.    [provider]  amLODipine (NORVASC) 10 MG tablet Take 1 tablet (10 mg total) by mouth daily. 04/08/19   Bing Neighbors, FNP  doxycycline (VIBRAMYCIN) 100 MG capsule Take 1 capsule (100 mg total) by mouth 2 (two) times daily for 7 days. 11/09/19 11/16/19  Carroll Sage, PA-C  ENBREL SURECLICK 50 MG/ML injection Inject 50 mg into the skin every Saturday.  06/15/18   [provider]  erythromycin ophthalmic ointment Place a 1/2 inch ribbon of ointment into the lower eyelid. Left eye x 7 days 04/08/19   Bing Neighbors, FNP  ibuprofen (ADVIL) 800 MG tablet Take 1 tablet (800 mg total) by mouth every 8 (eight) hours as needed. 09/18/18   Cornett, Maisie Fus, MD  ibuprofen (ADVIL,MOTRIN) 200 MG tablet Take 600 mg by mouth every 6 (six) hours as needed for fever, headache or mild pain. For pain.    [provider]    Allergies  Peanut-containing drug products  Review of Systems   Review of Systems  Constitutional: Negative for chills and fever.  HENT: Negative for congestion.   Respiratory: Negative for shortness of breath.   Cardiovascular: Negative for chest pain.  Gastrointestinal: Negative for abdominal pain.  Genitourinary: Negative for enuresis, flank pain, penile pain, penile swelling and scrotal swelling.       Admits to abscess on his left groin.  Musculoskeletal: Negative for back pain.  Skin: Negative for rash.  Neurological: Negative for dizziness.  Hematological: Does not bruise/bleed easily.    Physical  Exam Updated Vital Signs BP (!) 177/106 (BP Location: Right Arm)   Pulse 75   Temp 99.3 F (37.4 C) (Oral)   Resp 18   Ht 5\' 10"  (1.778 m)   Wt 79.8 kg   SpO2 98%   BMI 25.25 kg/m   Physical Exam Vitals and nursing note reviewed. Exam conducted with a chaperone present.  Constitutional:      General: He is not in acute distress.    Appearance: Normal appearance. He is not ill-appearing or diaphoretic.  HENT:     Head: Normocephalic and atraumatic.     Nose: No congestion or rhinorrhea.  Eyes:     General: No scleral icterus.       Right eye: No discharge.        Left eye: No discharge.     Conjunctiva/sclera: Conjunctivae normal.  Pulmonary:     Effort: Pulmonary effort is normal. No respiratory distress.     Breath sounds: Normal breath sounds. No wheezing.  Genitourinary:    Penis: Normal.      Testes: Normal.     Comments: Genital exam was performed, testicles and penis did not have any noted gross abnormalities.  There was a large abscess noted on patient's left groin and scrotum, and had spontaneous drained on its own, fluctuance felt, tender to palpation. Musculoskeletal:     Cervical back: Neck supple.     Right lower leg: No edema.     Left lower leg: No edema.  Skin:    General: Skin is warm and dry.     Capillary Refill: Capillary refill takes less than 2 seconds.     Coloration: Skin is not jaundiced or pale.  Neurological:     Mental Status: He is alert and oriented to person, place, and time.  Psychiatric:        Mood and Affect: Mood normal.       ED Results / Procedures / Treatments   Labs (all labs ordered are listed, but only abnormal results are displayed) Labs Reviewed - No data to display  EKG None  Radiology SCROTUM W/DOPPLER  Result Date: 11/09/2019 CLINICAL DATA:  Scrotal abscess EXAM: SCROTAL ULTRASOUND DOPPLER ULTRASOUND OF THE TESTICLES TECHNIQUE: Complete ultrasound examination of the testicles, epididymis, and other scrotal  structures was performed. Color and spectral Doppler ultrasound were also utilized to evaluate blood flow to the testicles. COMPARISON:  CT 07/17/2018 FINDINGS: Right testicle Measurements: 4.2 x 2.5 x 3.5 cm. No mass or microlithiasis visualized. Left testicle Measurements: 4.5 x 2.8 x 3.7 cm. No mass or microlithiasis visualized. Right epididymis:  Normal in size and appearance. Left epididymis:  Normal in size and appearance. Hydrocele:  None visualized. Varicocele:  None visualized. Pulsed Doppler interrogation of both testes demonstrates normal low resistance arterial and venous waveforms bilaterally. Other: Complex fluid collection within the soft tissues of the left scrotum and left groin with surrounding hyperemia.  Collection measures approximately 4.1 x 1.7 x 2.6 cm. No sonographic evidence of soft tissue gas. IMPRESSION: 1. Negative for testicular torsion or intratesticular mass. 2. Complex fluid collection involving the soft tissues of the left scrotal wall and left groin measuring up to 4.1 cm, suggestive of abscess. Recommend further evaluation with contrast-enhanced CT of the pelvis. Electronically Signed   By: Duanne Guess D.O.   On: 11/09/2019 12:42    Procedures .Marland KitchenIncision and Drainage  Date/Time: 11/09/2019 2:45 PM Performed by: Carroll Sage, PA-C Authorized by: Carroll Sage, PA-C   Consent:    Consent obtained:  Verbal   Consent given by:  Patient   Risks discussed:  Bleeding, incomplete drainage, pain, damage to other organs and infection   Alternatives discussed:  No treatment Location:    Type:  Abscess   Size:  4cm   Location: Scrotum. Pre-procedure details:    Skin preparation:  Betadine Anesthesia (see MAR for exact dosages):    Anesthesia method:  Local infiltration   Local anesthetic:  Lidocaine 1% w/o epi Procedure type:    Complexity:  Simple Procedure details:    Needle aspiration: no     Incision types:  Single straight   Incision depth:   Dermal   Scalpel blade:  15   Wound management:  Probed and deloculated   Drainage:  Bloody and purulent   Drainage amount:  Moderate   Wound treatment:  Wound left open   Packing materials:  None Post-procedure details:    Patient tolerance of procedure:  Tolerated well, no immediate complications   (including critical care time)  Medications Ordered in ED Medications  lidocaine (PF) (XYLOCAINE) 1 % injection 5 mL (5 mLs Infiltration Given by Other 11/09/19 1402)    ED Course  I have reviewed the triage vital signs and the nursing notes.  Pertinent labs & imaging results that were available during my care of the patient were reviewed by me and considered in my medical decision making (see chart for details).    MDM Rules/Calculators/A&P                          I have personally reviewed all imaging, labs and have interpreted them.  Patient did not appear to be in acute distress, he was alert and oriented.  On exam chaperone was present genitals were examined, there is a 4 cm abscess noted on the left side of the patient's scrotum.  It had spontaneous ruptured and it was draining while on exam, fluctuance was felt, no erythema or induration felt.  Will ultrasound for further evaluation of abscess.  Ultrasound showed 4.1 cm abscess in the scrotal wall will consult urology for further evaluation.  Spoke with Dr.Nikas of urology she recommends opening up the abscess for further drainage and place him on antibiotics.  She will see him outpatient.  I&D was performed patient tolerated procedure well no complications.  I have low suspicion for UTI or pyelonephritis as patient denies dysuria, urgency, frequency, no CVA tenderness on exam.  Low suspicion for cellulitis as there is no erythema noted around the abscess.  Unlikely patient suffering from epididymitis as patient denies testicular pain, testicles were nontender to palpation.  Low suspicion for systemic infection as patient was  nontoxic-appearing, vital signs reassuring.  Further lab work and imaging were not warranted at this time.  Patient appears to be resting comfortably in bed showing no acute signs stress.  Patient's vitals  have remained stable does not meet criteria to be admitted to the hospital.  Likely patient has an abscess on his scrotum I&D was performed and is draining properly.  Will place patient on antibiotics and have him follow-up with urology.  Patient discussed with attending who agrees assessment and plan.  Patient is given at home care so strict return precautions.  Patient verbalized that he understood and agreed to plan.   Final Clinical Impression(s) / ED Diagnoses Final diagnoses:  Abscess    Rx / DC Orders ED Discharge Orders         Ordered    doxycycline (VIBRAMYCIN) 100 MG capsule  2 times daily     Discontinue  Reprint     11/09/19 1423           Carroll Sage, PA-C 11/09/19 1457    Margarita Grizzle, MD 11/09/19 [phone]

## 2019-11-09 NOTE — ED Triage Notes (Signed)
Pt reports abscess to L groin since Monday.

## 2019-11-09 NOTE — ED Notes (Signed)
Pt transported to [facility]

## 2019-12-09 LAB — COLOGUARD

## 2020-01-07 ENCOUNTER — Emergency Department (HOSPITAL_COMMUNITY)
Admission: EM | Admit: 2020-01-07 | Discharge: 2020-01-07 | Disposition: A | Discharge status: Home / Self Care | Payer: BC Managed Care – PPO | Attending: Emergency Medicine | Admitting: Emergency Medicine

## 2020-01-07 ENCOUNTER — Other Ambulatory Visit: Payer: Self-pay

## 2020-01-07 DIAGNOSIS — Z79899 Other long term (current) drug therapy: Secondary | ICD-10-CM | POA: Diagnosis not present

## 2020-01-07 DIAGNOSIS — Z91010 Allergy to peanuts: Secondary | ICD-10-CM | POA: Insufficient documentation

## 2020-01-07 DIAGNOSIS — I10 Essential (primary) hypertension: Secondary | ICD-10-CM | POA: Insufficient documentation

## 2020-01-07 DIAGNOSIS — N492 Inflammatory disorders of scrotum: Secondary | ICD-10-CM

## 2020-01-07 MED ORDER — LIDOCAINE HCL (PF) 1 % IJ SOLN
5.0000 mL | Freq: Once | INTRAMUSCULAR | Status: DC
  Filled 2020-01-07: qty 5

## 2020-01-07 MED ORDER — DOXYCYCLINE HYCLATE 100 MG PO CAPS: 100.0000 mg | ORAL_CAPSULE | Freq: Two times a day (BID) | ORAL | 0 refills | Status: AC

## 2020-01-07 MED ORDER — ACETAMINOPHEN 500 MG PO TABS
1000.0000 mg | ORAL_TABLET | Freq: Once | ORAL | Status: AC
  Administered 2020-01-07: 1000 mg via ORAL
  Filled 2020-01-07: qty 2

## 2020-01-07 MED ORDER — KETOROLAC TROMETHAMINE 60 MG/2ML IM SOLN
60.0000 mg | Freq: Once | INTRAMUSCULAR | Status: AC
  Administered 2020-01-07: 60 mg via INTRAMUSCULAR
  Filled 2020-01-07: qty 2

## 2020-01-07 NOTE — ED Provider Notes (Signed)
[facility] EMERGENCY DEPARTMENT Provider Note   CSN: [id number] Arrival date & time: 01/07/20  0630     History Chief Complaint  Patient presents with  . Abscess    Mark [name] is a 49 y.o. male.  HPI      49yo male with history of hypertension, RA, priori abscesses including left groin abscess in August presents with concern for repeat left groin abscess.  Recurrence of symptoms began on Sunday, increasing pain and swelling since then. Describes the pain as 100/10. Has been taking ibuprofen without relief.  It is worse with palpation. Has had some constipation but is passing flatus. No nausea, vomiting or abdominal pain. No fevers.  Past Medical History:  Diagnosis Date  . Arthritis    RA  . Hip fracture (HCC)   . Hypertension   . Rheumatoid arthritis [facility])     Patient Active Problem List   Diagnosis Date Noted  . Perirectal abscess 07/17/2018    Past Surgical History:  Procedure Laterality Date  . HYDRADENITIS EXCISION Bilateral 09/18/2018   Procedure: EXCISION BILATERAL AXILLARY HIDRADENITIS;  Surgeon: Harriette Bouillon, MD;  Location: [facility];  Service: General;  Laterality: Bilateral;  . INCISION AND DRAINAGE PERIRECTAL ABSCESS N/A 07/17/2018   Procedure: IRRIGATION AND DEBRIDEMENT PERIRECTAL ABSCESS;  Surgeon: Axel Filler, MD;  Location: [facility] OR;  Service: General;  Laterality: N/A;       Family History  Problem Relation Age of Onset  . Heart failure Mother   . Aneurysm Father     Social History   Tobacco Use  . Smoking status: Never Smoker  . Smokeless tobacco: Never Used  Vaping Use  . Vaping Use: Never used  Substance Use Topics  . Alcohol use: Not Currently  . Drug use: No    Frequency: 3.0 times per week    Home Medications Prior to Admission medications   Medication Sig Start Date End Date Taking? Authorizing Provider  acetaminophen (TYLENOL) 500 MG tablet Take 500 mg by mouth every 6 (six) hours  as needed.    [provider]  amLODipine (NORVASC) 10 MG tablet Take 1 tablet (10 mg total) by mouth daily. 04/08/19   Bing Neighbors, FNP  doxycycline (VIBRAMYCIN) 100 MG capsule Take 1 capsule (100 mg total) by mouth 2 (two) times daily for 14 days. 01/07/20 01/21/20  Alvira Monday, MD  ENBREL SURECLICK 50 MG/ML injection Inject 50 mg into the skin every Saturday.  06/15/18   [provider]  erythromycin ophthalmic ointment Place a 1/2 inch ribbon of ointment into the lower eyelid. Left eye x 7 days 04/08/19   Bing Neighbors, FNP  ibuprofen (ADVIL) 800 MG tablet Take 1 tablet (800 mg total) by mouth every 8 (eight) hours as needed. 09/18/18   Cornett, Maisie Fus, MD  ibuprofen (ADVIL,MOTRIN) 200 MG tablet Take 600 mg by mouth every 6 (six) hours as needed for fever, headache or mild pain. For pain.    [provider]    Allergies    Peanut-containing drug products  Review of Systems   Review of Systems  Constitutional: Negative for fever.  Eyes: Negative for visual disturbance.  Respiratory: Negative for shortness of breath.   Cardiovascular: Negative for chest pain.  Gastrointestinal: Positive for constipation. Negative for abdominal pain, nausea and vomiting.  Genitourinary: Positive for scrotal swelling. Negative for difficulty urinating.  Musculoskeletal: Negative for back pain and neck stiffness.  Skin: Positive for color change. Negative for rash.  Neurological:  Negative for syncope and headaches.    Physical Exam Updated Vital Signs BP (!) 181/100 (BP Location: Right Arm)   Pulse 88   Temp 98.7 F (37.1 C) (Oral)   Resp 16   SpO2 97%   Physical Exam Vitals and nursing note reviewed.  Constitutional:      General: He is not in acute distress.    Appearance: Normal appearance. He is not ill-appearing, toxic-appearing or diaphoretic.  HENT:     Head: Normocephalic.  Eyes:     Conjunctiva/sclera: Conjunctivae normal.  Cardiovascular:      Rate and Rhythm: Normal rate and regular rhythm.     Pulses: Normal pulses.  Pulmonary:     Effort: Pulmonary effort is normal. No respiratory distress.  Genitourinary:    Comments: Fluctuance, induration   left groin 5cm Left inguinal lymphadenopathy Musculoskeletal:        General: No deformity or signs of injury.     Cervical back: No rigidity.  Skin:    General: Skin is warm and dry.     Coloration: Skin is not jaundiced or pale.  Neurological:     General: No focal deficit present.     Mental Status: He is alert and oriented to person, place, and time.       ED Results / Procedures / Treatments   Labs (all labs ordered are listed, but only abnormal results are displayed) Labs Reviewed - No data to display  EKG None  Radiology No results found.  Procedures .Marland KitchenIncision and Drainage  Date/Time: 01/07/2020 9:58 PM Performed by: Alvira Monday, MD Authorized by: Alvira Monday, MD   Consent:    Consent obtained:  Verbal   Consent given by:  Patient   Risks discussed:  Bleeding, damage to other organs, infection, incomplete drainage and pain   Alternatives discussed:  No treatment Location:    Type:  Abscess   Size:  55   Location:  Lower extremity   Lower extremity location:  Leg Pre-procedure details:    Skin preparation:  Chloraprep Sedation:    Sedation type:  Anxiolysis Procedure type:    Complexity:  Simple Procedure details:    Incision types:  Single straight   Scalpel blade:  11   Drainage:  Purulent and bloody   Packing materials:  1/4 in gauze   (including critical care time)  Medications Ordered in ED Medications  ketorolac (TORADOL) injection 60 mg (60 mg Intramuscular Given 01/07/20 0827)  acetaminophen (TYLENOL) tablet 1,000 mg (1,000 mg Oral Given 01/07/20 0827)    ED Course  I have reviewed the triage vital signs and the nursing notes.  Pertinent labs & imaging results that were available during my care of the patient were  reviewed by me and considered in my medical decision making (see chart for details).    MDM Rules/Calculators/A&P                          49yo male with history of hypertension, RA, priori abscesses including left groin abscess in August presents with concern for repeat left groin abscess.  Had US done and incision and drainage in August however symptoms have recurred. Exam consistent with abscess. No sign of incarcerated hernia or other abnormalities. Discussed with Urology and discussed risks and benefits of procedure. Bedside incision and drainage completed with copious drainage. Given rx for doxycycline. Patient discharged in stable condition with understanding of reasons to return.      Final  Clinical Impression(s) / ED Diagnoses Final diagnoses:  Scrotal abscess    Rx / DC Orders ED Discharge Orders         Ordered    doxycycline (VIBRAMYCIN) 100 MG capsule  2 times daily        01/07/20 3735           Alvira Monday, MD 01/07/20 2203

## 2020-01-07 NOTE — ED Triage Notes (Signed)
Pt presents to ED POV. Pt c/o abcess on L leg. Pt reports that he was seen recently and had I&D. Reports pain is 10/10

## 2020-06-23 ENCOUNTER — Other Ambulatory Visit: Payer: Self-pay | Admitting: Rheumatology

## 2020-06-23 DIAGNOSIS — R7989 Other specified abnormal findings of blood chemistry: Secondary | ICD-10-CM

## 2020-07-13 ENCOUNTER — Ambulatory Visit
Admission: RE | Admit: 2020-07-13 | Discharge: 2020-07-13 | Disposition: A | Discharge status: Home / Self Care | Payer: BC Managed Care – PPO | Source: Ambulatory Visit | Attending: Rheumatology | Admitting: Rheumatology

## 2020-07-13 DIAGNOSIS — R7989 Other specified abnormal findings of blood chemistry: Secondary | ICD-10-CM

## 2020-10-17 IMAGING — CT CT PELVIS WITH CONTRAST
2 of 6 series · 15 of 46 positions shown, 19 images · IV contrast (Omni 300)
Comparison: None.

CLINICAL DATA: Perianal and buttock abscess.

EXAM:
CT PELVIS WITH CONTRAST
TECHNIQUE: Multidetector CT imaging of the pelvis was performed using the
standard protocol following the bolus administration of intravenous
contrast.
CONTRAST:  100mL TBZB52-VMM IOPAMIDOL (TBZB52-VMM) INJECTION 61%

[Series 3: pelvis with 5.0 · axial · 0.90mm/px · z∈[+667,+1004]mm · 12 of 86 slices shown, 16 images]
[im 8/86  soft-tissue]
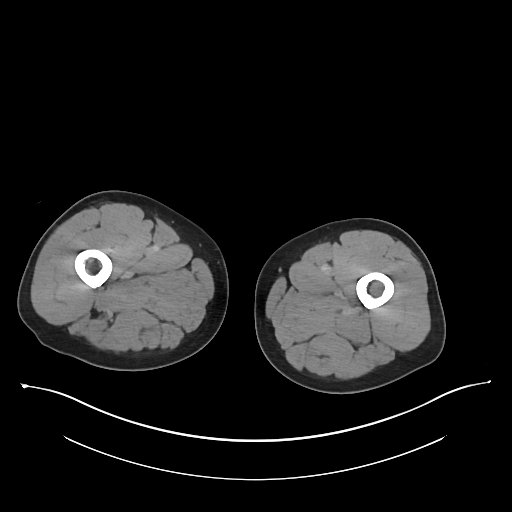
[im 8/86  bone]
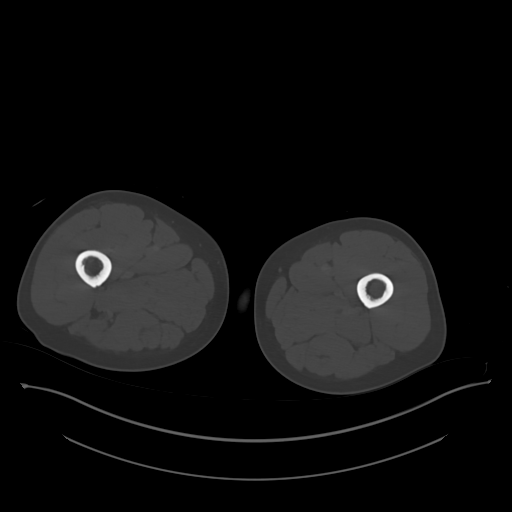
[im 15/86  soft-tissue]
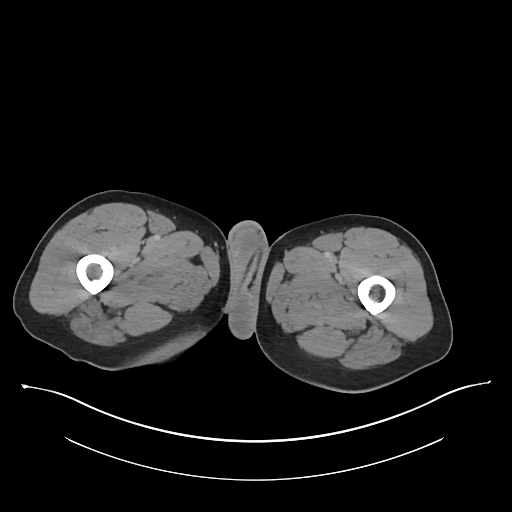
[im 22/86  soft-tissue]
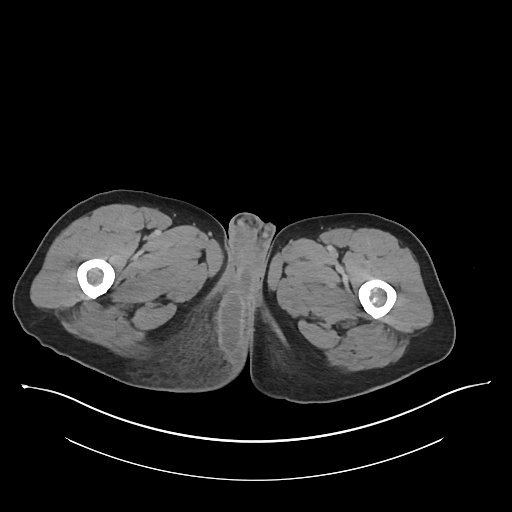
[im 32/86  soft-tissue]
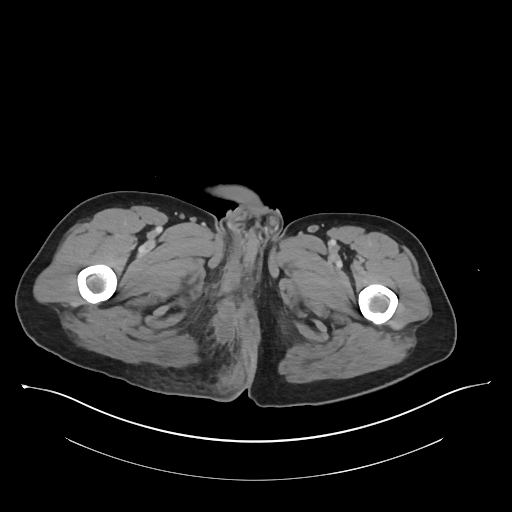
[im 39/86  soft-tissue]
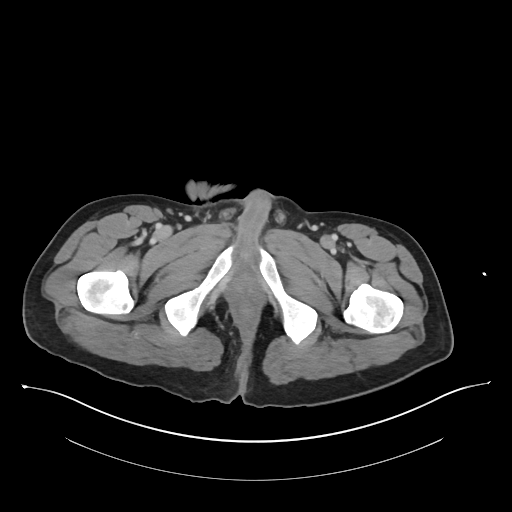
[im 47/86  soft-tissue]
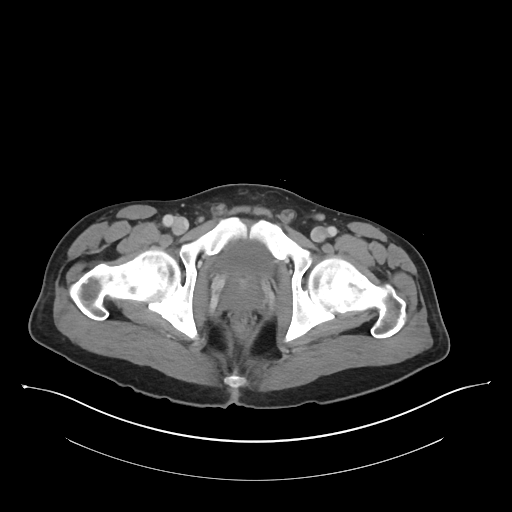
[im 54/86  soft-tissue]
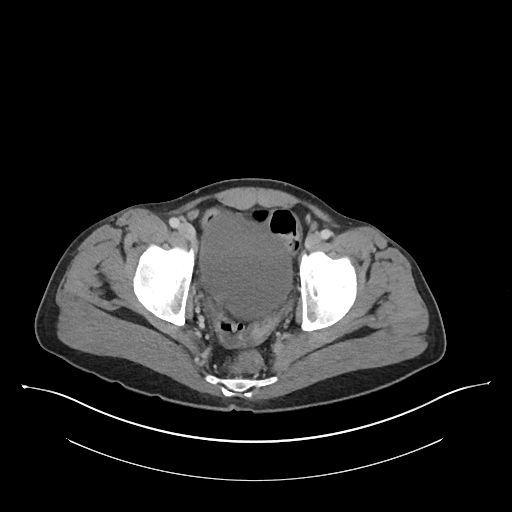
[im 64/86  soft-tissue]
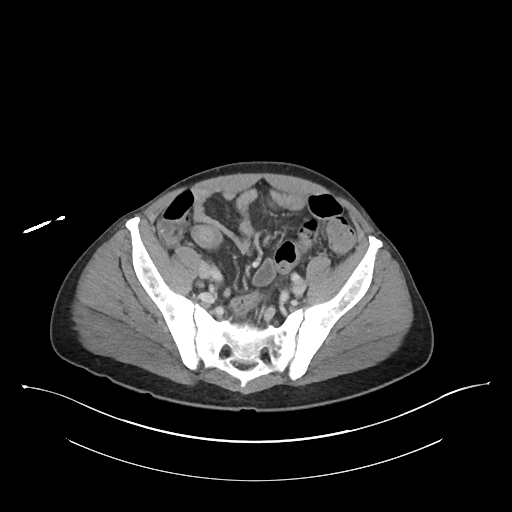
[im 71/86  soft-tissue]
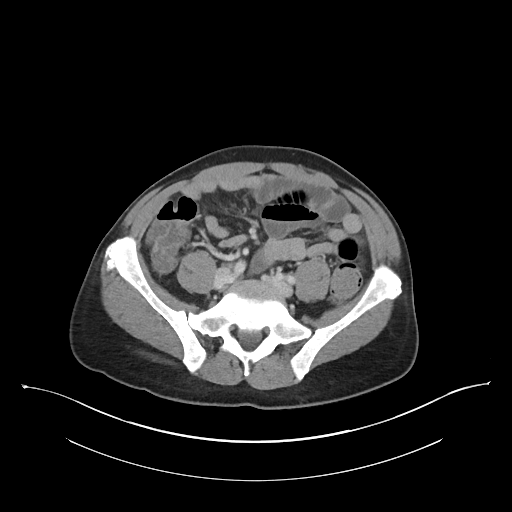
[im 71/86  lung]
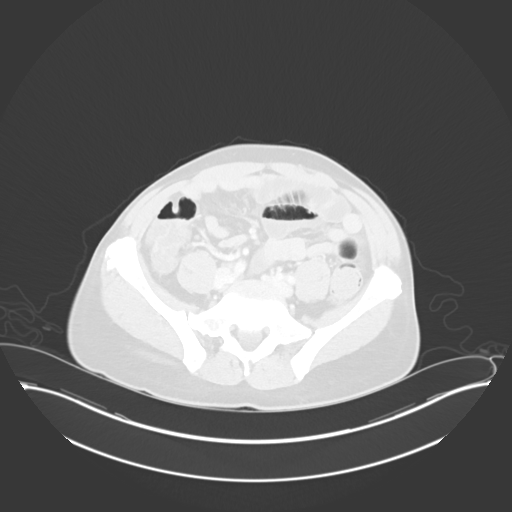
[im 71/86  bone]
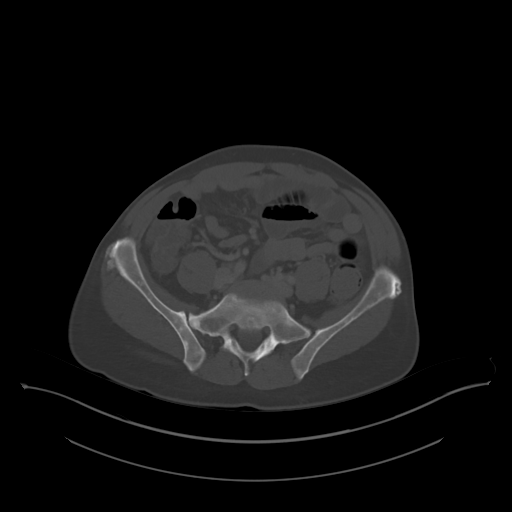
[im 75/86  lung]
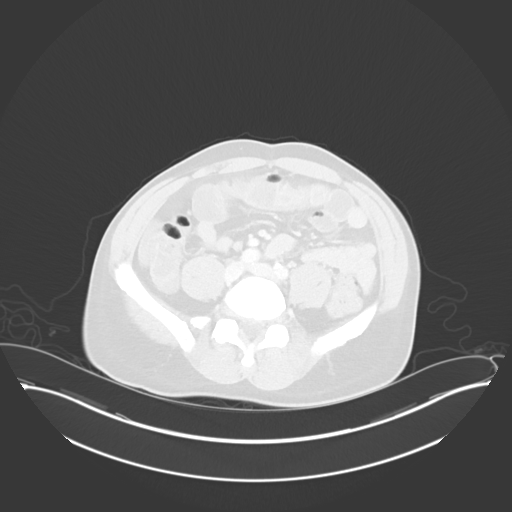
[im 78/86  soft-tissue]
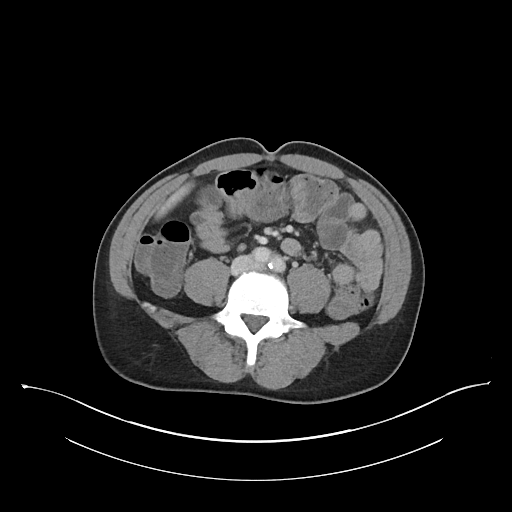
[im 78/86  lung]
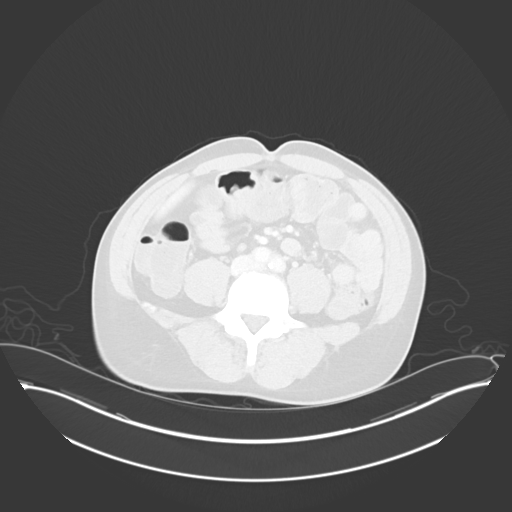
[im 82/86  lung]
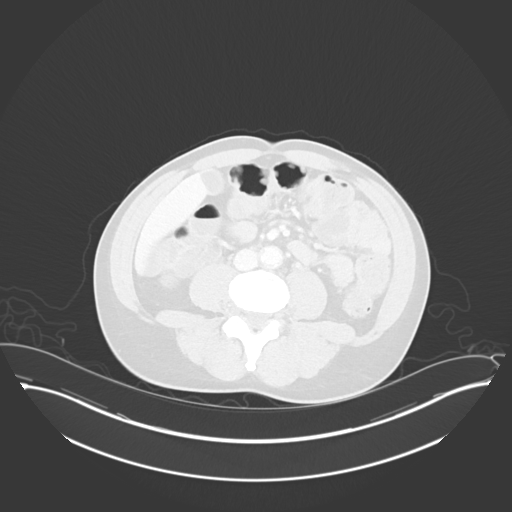

[Series 5: pelvis with 2.0 cor · coronal · 0.81mm/px · 3 of 189 slices shown]
[im 38/189  soft-tissue]
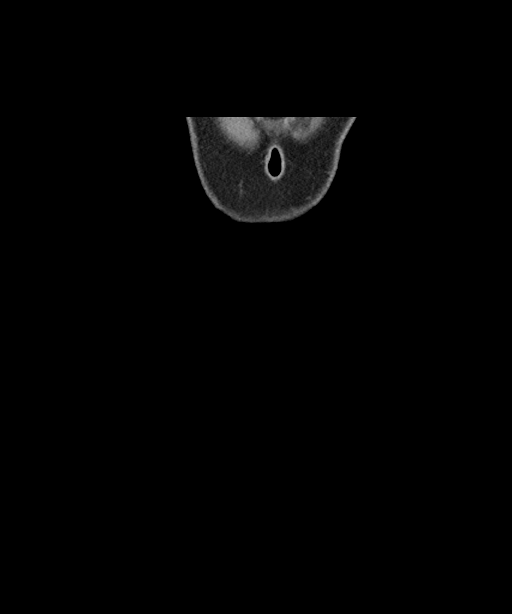
[im 76/189  soft-tissue]
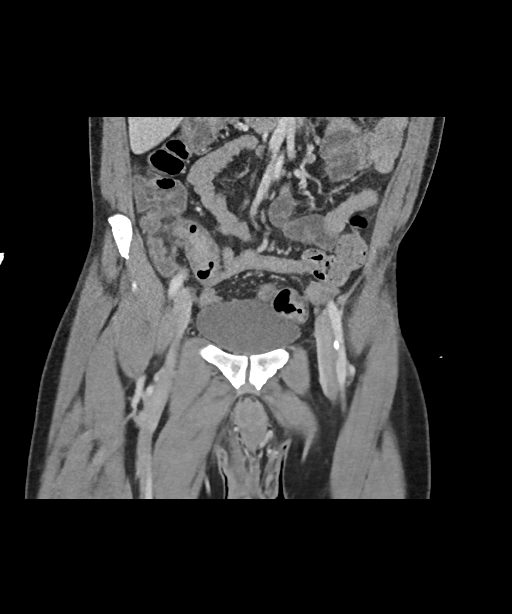
[im 113/189  soft-tissue]
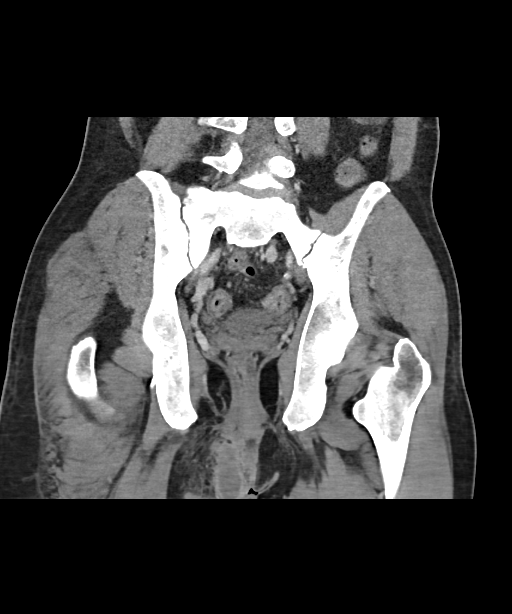

[15 of 46 positions shown; findings below may reference images not displayed]

FINDINGS: Urinary Tract: Unremarkable urinary bladder. Simple appearing cyst
in the lower pole of the left kidney is incompletely visualized on
this study.

Bowel: Moderate wall thickening is seen involving the terminal
ileum, suspicious for Crohn's disease. A large complex right
perianal abscess is seen in the medial right buttock which measures
11.3 x 2.4 cm.

Vascular/Lymphatic: No pathologically enlarged lymph nodes or other
significant abnormality.

Reproductive:  No mass or other significant abnormality.

Other: None.

Musculoskeletal: Symmetric erosive changes and surrounding sclerosis
involving both sacroiliac joints, consistent with sacroiliitis.
IMPRESSION: 1. Large complex right perianal and buttock abscess.
2. Moderate wall thickening involving terminal ileum, suspicious for
Crohn's disease.
3. Bilateral sacroiliitis, which can be seen with Crohn's disease or
other inflammatory bowel disease.

## 2021-02-21 ENCOUNTER — Emergency Department (HOSPITAL_BASED_OUTPATIENT_CLINIC_OR_DEPARTMENT_OTHER)
Admission: EM | Admit: 2021-02-21 | Discharge: 2021-02-21 | Disposition: A | Discharge status: Home / Self Care | Payer: BC Managed Care – PPO | Attending: Emergency Medicine | Admitting: Emergency Medicine

## 2021-02-21 ENCOUNTER — Encounter (HOSPITAL_BASED_OUTPATIENT_CLINIC_OR_DEPARTMENT_OTHER): Payer: Self-pay

## 2021-02-21 ENCOUNTER — Emergency Department (HOSPITAL_BASED_OUTPATIENT_CLINIC_OR_DEPARTMENT_OTHER): Payer: BC Managed Care – PPO | Admitting: Radiology

## 2021-02-21 ENCOUNTER — Other Ambulatory Visit: Payer: Self-pay

## 2021-02-21 DIAGNOSIS — Z79899 Other long term (current) drug therapy: Secondary | ICD-10-CM | POA: Insufficient documentation

## 2021-02-21 DIAGNOSIS — M5412 Radiculopathy, cervical region: Secondary | ICD-10-CM | POA: Diagnosis not present

## 2021-02-21 DIAGNOSIS — R202 Paresthesia of skin: Secondary | ICD-10-CM | POA: Diagnosis not present

## 2021-02-21 DIAGNOSIS — I10 Essential (primary) hypertension: Secondary | ICD-10-CM | POA: Diagnosis not present

## 2021-02-21 DIAGNOSIS — M25512 Pain in left shoulder: Secondary | ICD-10-CM | POA: Diagnosis not present

## 2021-02-21 MED ORDER — NAPROXEN 375 MG PO TABS: ORAL_TABLET | ORAL | 0 refills | Status: DC

## 2021-02-21 MED ORDER — HYDROCODONE-ACETAMINOPHEN 5-325 MG PO TABS
1.0000 | ORAL_TABLET | Freq: Four times a day (QID) | ORAL | 0 refills | Status: DC | PRN
Start: 2021-02-21 — End: 2022-10-26

## 2021-02-21 MED ORDER — NAPROXEN 250 MG PO TABS
500.0000 mg | ORAL_TABLET | Freq: Once | ORAL | Status: DC
  Filled 2021-02-21: qty 2

## 2021-02-21 NOTE — ED Triage Notes (Signed)
Patient here POV from Home with Shoulder Pain.   Patient states he began having Left Shoulder Pain with Acute Onset yesterday PM with no Acute Trauma. Patient also endorses radiating numbness to Left Hand  NAD Noted during Triage. A&Ox4. GCS 15. Ambulatory.

## 2021-02-21 NOTE — ED Notes (Signed)
Discharge instruction including prescription and pain management discussed with pt. Pt verbalized understanding with no questions at this time. Pt to pick up prescription from [facility] pharmacy.

## 2021-02-21 NOTE — ED Provider Notes (Signed)
[facility] EMERGENCY Provider Note: Lowella Dell, MD, FACEP  CSN: [id number] MRN: [id number] ARRIVAL: 02/21/21 at 2108 ROOM: DB013/DB013   CHIEF COMPLAINT  Shoulder Pain   HISTORY OF PRESENT ILLNESS  02/21/21 11:06 PM Mark [name] is a 50 y.o. male with shoulder pain since yesterday.  The pain is located just anterior to his shoulder joint.  There was no injury that initiated the pain.  He rates his pain as a 10 out of 10, worse with movement.  It is sharp in nature.  In addition to the shoulder pain he has having pain in his neck, worse with movement.  This is associated with some paresthesias in the C8 dermatome.   Past Medical History:  Diagnosis Date   Arthritis    RA   Hip fracture (HCC)    Hypertension    Rheumatoid arthritis (HCC)     Past Surgical History:  Procedure Laterality Date   HYDRADENITIS EXCISION Bilateral 09/18/2018   Procedure: EXCISION BILATERAL AXILLARY HIDRADENITIS;  Surgeon: Harriette Bouillon, MD;  Location: [facility];  Service: General;  Laterality: Bilateral;   INCISION AND DRAINAGE PERIRECTAL ABSCESS N/A 07/17/2018   Procedure: IRRIGATION AND DEBRIDEMENT PERIRECTAL ABSCESS;  Surgeon: Axel Filler, MD;  Location: [facility] OR;  Service: General;  Laterality: N/A;    Family History  Problem Relation Age of Onset   Heart failure Mother    Aneurysm Father     Social History   Tobacco Use   Smoking status: Never   Smokeless tobacco: Never  Vaping Use   Vaping Use: Never used  Substance Use Topics   Alcohol use: Not Currently   Drug use: Not Currently    Frequency: 3.0 times per week    Prior to Admission medications   Medication Sig Start Date End Date Taking? Authorizing Provider  HYDROcodone-acetaminophen (NORCO) 5-325 MG tablet Take 1 tablet by mouth every 6 (six) hours as needed for severe pain. 02/21/21  Yes Chelcey Caputo, MD  naproxen (NAPROSYN) 375 MG tablet Take 1 tablet twice daily for shoulder pain. 02/21/21  Yes  Charlynn Salih, MD  amLODipine (NORVASC) 10 MG tablet Take 1 tablet (10 mg total) by mouth daily. 04/08/19   Bing Neighbors, FNP  ENBREL SURECLICK 50 MG/ML injection Inject 50 mg into the skin every Saturday.  06/15/18   [provider]    Allergies Peanut-containing drug products   REVIEW OF SYSTEMS  Negative except as noted here or in the History of Present Illness.   PHYSICAL EXAMINATION  Initial Vital Signs Blood pressure (!) 169/107, pulse 85, temperature 98.4 F (36.9 C), resp. rate 16, height 5\' 10"  (1.778 m), weight 77.1 kg, SpO2 99 %.  Examination General: Well-developed, well-nourished male in no acute distress; appearance consistent with age of record HENT: normocephalic; atraumatic Eyes: Normal appearance Neck: supple; exacerbation of pain with movement of neck Heart: regular rate and rhythm Lungs: clear to auscultation bilaterally Abdomen: soft; nondistended; nontender; bowel sounds present Extremities: No deformity; anterior left shoulder tenderness; pain on attempted movement of left shoulder with significantly decreased range of motion Neurologic: Awake, alert and oriented; motor function intact in all extremities and symmetric; no facial droop Skin: Warm and dry Psychiatric: Normal mood and affect   RESULTS  Summary of this visit's results, reviewed and interpreted by myself:   EKG Interpretation  Date/Time:    Ventricular Rate:    PR Interval:    QRS Duration:   QT Interval:    QTC Calculation:  R Axis:     Text Interpretation:         Laboratory Studies: No results found for this or any previous visit (from the past 24 hour(s)). Imaging Studies: DG Shoulder Left  Result Date: 02/21/2021 CLINICAL DATA:  Left shoulder pain for 2 days, no known injury, initial encounter EXAM: LEFT SHOULDER - 2+ VIEW COMPARISON:  None FINDINGS: Humeral head is well seated. Very minimal glenohumeral degenerative changes are seen. The underlying bony thorax  appears within normal limits. No soft tissue abnormality is seen. IMPRESSION: Mild degenerative change without acute abnormality. Electronically Signed   By: Alcide Clever M.D.   On: 02/21/2021 22:00    ED COURSE and MDM  Nursing notes, initial and subsequent vitals signs, including pulse oximetry, reviewed and interpreted by myself.  Vitals:   02/21/21 2117 02/21/21 2134  BP: (!) 169/107   Pulse: 85   Resp: 16   Temp: 98.4 F (36.9 C)   SpO2: 99%   Weight:  77.1 kg  Height:  5\' 10"  (1.778 m)   Medications  naproxen (NAPROSYN) tablet 500 mg (has no administration in time range)    There are no significant changes seen on radiographs.  The patient does have rheumatoid arthritis and is on injections of Enbrel every 2 weeks.  His pattern of pain and paresthesias are consistent with both left shoulder etiology (likely rotator cuff or bicipital tendinitis) and T8 cervical radiculopathy.  It is possible he has elements of both.  We will treat him with an anti-inflammatory and a brief course of narcotic pain medication and refer to orthopedics.  The pattern of pain is atypical for her rheumatoid arthritis flare but this is a possibility as well.  PROCEDURES  Procedures   ED DIAGNOSES     ICD-10-CM   1. Cervical radiculopathy at C8  M54.12     2. Acute pain of left shoulder  M25.512          Keenya Matera, , MD 02/21/21 2316

## 2021-11-02 ENCOUNTER — Other Ambulatory Visit: Payer: Self-pay

## 2021-11-02 ENCOUNTER — Emergency Department (HOSPITAL_BASED_OUTPATIENT_CLINIC_OR_DEPARTMENT_OTHER): Payer: BC Managed Care – PPO

## 2021-11-02 ENCOUNTER — Emergency Department (HOSPITAL_BASED_OUTPATIENT_CLINIC_OR_DEPARTMENT_OTHER)
Admission: EM | Admit: 2021-11-02 | Discharge: 2021-11-02 | Disposition: A | Discharge status: Home / Self Care | Payer: BC Managed Care – PPO | Attending: Emergency Medicine | Admitting: Emergency Medicine

## 2021-11-02 ENCOUNTER — Other Ambulatory Visit (HOSPITAL_BASED_OUTPATIENT_CLINIC_OR_DEPARTMENT_OTHER): Payer: Self-pay

## 2021-11-02 ENCOUNTER — Encounter (HOSPITAL_BASED_OUTPATIENT_CLINIC_OR_DEPARTMENT_OTHER): Payer: Self-pay | Admitting: Emergency Medicine

## 2021-11-02 DIAGNOSIS — R112 Nausea with vomiting, unspecified: Secondary | ICD-10-CM | POA: Diagnosis not present

## 2021-11-02 DIAGNOSIS — I10 Essential (primary) hypertension: Secondary | ICD-10-CM | POA: Diagnosis not present

## 2021-11-02 DIAGNOSIS — R1031 Right lower quadrant pain: Secondary | ICD-10-CM | POA: Insufficient documentation

## 2021-11-02 LAB — CBC
HCT: 41.1 % (ref 39.0–52.0)
Hemoglobin: 13.6 g/dL (ref 13.0–17.0)
MCH: 30.4 pg (ref 26.0–34.0)
MCHC: 33.1 g/dL (ref 30.0–36.0)
MCV: 91.7 fL (ref 80.0–100.0)
Platelets: 432 10*3/uL — ABNORMAL HIGH (ref 150–400)
RBC: 4.48 MIL/uL (ref 4.22–5.81)
RDW: 15.1 % (ref 11.5–15.5)
WBC: 18.6 10*3/uL — ABNORMAL HIGH (ref 4.0–10.5)
nRBC: 0 % (ref 0.0–0.2)

## 2021-11-02 LAB — COMPREHENSIVE METABOLIC PANEL
ALT: 100 U/L — ABNORMAL HIGH (ref 0–44)
AST: 72 U/L — ABNORMAL HIGH (ref 15–41)
Albumin: 4.3 g/dL (ref 3.5–5.0)
Alkaline Phosphatase: 276 U/L — ABNORMAL HIGH (ref 38–126)
Anion gap: 11 (ref 5–15)
BUN: 16 mg/dL (ref 6–20)
CO2: 25 mmol/L (ref 22–32)
Calcium: 9.8 mg/dL (ref 8.9–10.3)
Chloride: 104 mmol/L (ref 98–111)
Creatinine, Ser: 0.9 mg/dL (ref 0.61–1.24)
GFR, Estimated: >60 mL/min (ref >60)
Glucose, Bld: 98 mg/dL (ref 70–99)
Potassium: 3.7 mmol/L (ref 3.5–5.1)
Sodium: 140 mmol/L (ref 135–145)
Total Bilirubin: 0.5 mg/dL (ref 0.3–1.2)
Total Protein: 7.8 g/dL (ref 6.5–8.1)

## 2021-11-02 LAB — LIPASE, BLOOD: Lipase: 33 U/L (ref 11–51)

## 2021-11-02 MED ORDER — LOPERAMIDE HCL 2 MG PO CAPS
2.0000 mg | ORAL_CAPSULE | Freq: Four times a day (QID) | ORAL | 0 refills | Status: DC | PRN
  Filled 2021-11-02: qty 12, 3d supply, fill #0

## 2021-11-02 MED ORDER — IOHEXOL 300 MG/ML  SOLN
100.0000 mL | Freq: Once | INTRAMUSCULAR | Status: AC | PRN
  Administered 2021-11-02: 80 mL via INTRAVENOUS

## 2021-11-02 MED ORDER — SODIUM CHLORIDE 0.9 % IV BOLUS
1000.0000 mL | Freq: Once | INTRAVENOUS | Status: AC
  Administered 2021-11-02: 1000 mL via INTRAVENOUS

## 2021-11-02 MED ORDER — ONDANSETRON HCL 4 MG/2ML IJ SOLN
4.0000 mg | Freq: Once | INTRAMUSCULAR | Status: AC
  Administered 2021-11-02: 4 mg via INTRAVENOUS
  Filled 2021-11-02: qty 2

## 2021-11-02 MED ORDER — ONDANSETRON 4 MG PO TBDP
4.0000 mg | ORAL_TABLET | Freq: Three times a day (TID) | ORAL | 0 refills | Status: DC | PRN
  Filled 2021-11-02: qty 18, 21d supply, fill #0

## 2021-11-02 MED ORDER — DICYCLOMINE HCL 20 MG PO TABS
20.0000 mg | ORAL_TABLET | Freq: Three times a day (TID) | ORAL | 0 refills | Status: DC | PRN
  Filled 2021-11-02: qty 20, 7d supply, fill #0

## 2021-11-02 NOTE — ED Provider Notes (Signed)
Emergency Department Provider Note   I have reviewed the triage vital signs and the nursing notes.   HISTORY  Chief Complaint Weakness and Emesis   HPI Mark [name] is a 51 y.o. male with past history of hidradenitis, RA, hypertension presents to the emergency department with vomiting.  Patient began to feel very fatigued and overall poorly yesterday but overnight developed multiple episodes of nonbloody emesis as well as some soft bowel movements.  He denies any fevers or shaking chills.  He has diffuse abdominal soreness but denies focal pain.  No discomfort into the chest or shortness of breath.  He has had several areas of hidradenitis which have started draining although states he did not very large relative to past episodes.  No UTI symptoms.  No sick contacts. No travel. No testicle pain.    Past Medical History:  Diagnosis Date   Arthritis    RA   Hip fracture (HCC)    Hypertension    Rheumatoid arthritis ([place])     Review of Systems  Constitutional: No fever/chills. Positive fatigue.  Eyes: No visual changes. ENT: No sore throat. Cardiovascular: Denies chest pain. Respiratory: Denies shortness of breath. Gastrointestinal: Positive abdominal pain. Positive nausea, vomiting. Soft BMs without diarrhea.  No constipation. Genitourinary: Negative for dysuria. Musculoskeletal: Negative for back pain. Skin: Negative for rash. Neurological: Negative for headaches, focal weakness or numbness.   ____________________________________________   PHYSICAL EXAM:  VITAL SIGNS: ED Triage Vitals  Enc Vitals Group     BP 11/02/21 0821 (!) 193/105     Pulse Rate 11/02/21 0821 67     Resp 11/02/21 0821 18     Temp 11/02/21 0821 98.1 F (36.7 C)     Temp Source 11/02/21 0821 Oral     SpO2 11/02/21 0821 99 %     Weight 11/02/21 0819 174 lb (78.9 kg)     Height 11/02/21 0819 5' 10" (1.778 m)   Constitutional: Alert and oriented. Well appearing and in no acute  distress. Eyes: Conjunctivae are normal.  Head: Atraumatic. Nose: No congestion/rhinnorhea. Mouth/Throat: Mucous membranes are moist.   Neck: No stridor.   Cardiovascular: Normal rate, regular rhythm. Good peripheral circulation. Grossly normal heart sounds.   Respiratory: Normal respiratory effort.  No retractions. Lungs CTAB. Gastrointestinal: Soft with mild RLQ tenderness on exam. No peritonitis. No distention.  No perirectal abscess or cellulitis.  He has some overlying skin dryness and irritation with no active drainage at this time. Genitourinary: Exam performed with patient's verbal consent.  He has no area of erythema or obvious abscess.  He has some overlying skin dryness and irritation with single area in the right inguinal crease with some clear drainage.  Musculoskeletal: No lower extremity tenderness nor edema. No gross deformities of extremities. Neurologic:  Normal speech and language. No gross focal neurologic deficits are appreciated.  Skin:  Skin is warm and dry. Skin exam as above in the anal and inguinal regions. No abscess.   ____________________________________________   LABS (all labs ordered are listed, but only abnormal results are displayed)  Labs Reviewed  COMPREHENSIVE METABOLIC PANEL - Abnormal; Notable for the following components:      Result Value   AST 72 (*)    ALT 100 (*)    Alkaline Phosphatase 276 (*)    All other components within normal limits  CBC - Abnormal; Notable for the following components:   WBC 18.6 (*)    Platelets 432 (*)    All other components  within normal limits  LIPASE, BLOOD  URINALYSIS, ROUTINE W REFLEX MICROSCOPIC   ____________________________________________  RADIOLOGY  CT ABDOMEN PELVIS W CONTRAST  Result Date: 11/02/2021 CLINICAL DATA:  Right lower quadrant abdominal pain. EXAM: CT ABDOMEN AND PELVIS WITH CONTRAST TECHNIQUE: Multidetector CT imaging of the abdomen and pelvis was performed using the standard protocol  following bolus administration of intravenous contrast. RADIATION DOSE REDUCTION: This exam was performed according to the departmental dose-optimization program which includes automated exposure control, adjustment of the mA and/or kV according to patient size and/or use of iterative reconstruction technique. CONTRAST:  50m OMNIPAQUE IOHEXOL 300 MG/ML  SOLN COMPARISON:  CT July 17, 2018 FINDINGS: Lower chest: Bibasilar atelectasis. Hepatobiliary: No suspicious hepatic lesion. Gallbladder is unremarkable. No biliary ductal dilation. Pancreas: No pancreatic ductal dilation or evidence of acute inflammation. Spleen: No splenomegaly or focal splenic lesion. Adrenals/Urinary Tract: Bilateral adrenal glands appear normal. No hydronephrosis. Hypodense 5.3 cm left renal lesion measures fluid density consistent with a cyst and considered benign requiring no independent imaging follow-up. Nonobstructive punctate left upper pole renal stone. Urinary bladder is unremarkable for degree of distension. Stomach/Bowel: No radiopaque enteric contrast material was administered. Stomach is unremarkable for degree of distension. Marked wall thickening of the distal ileum extending into the terminal ileum with some fibrofatty proliferation and hypervascularity of the adjacent mesentery. Upstream to this area of inflammation there is are adjacent short segments of luminal narrowing demonstrating some low-density in the wall with an interposed dilated loop of bowel measuring 3.5 cm in diameter for reference on coronal image 58-64/5. Gas and fluid-filled proximal colon. Scattered areas of fibrofatty infiltration involving thecolon, for instance the descending colon on coronal image 45/5 in the sigmoid colon on image 18/5. Vascular/Lymphatic: Aortic atherosclerosis without abdominal aortic aneurysm. Prominent/mildly enlarged retroperitoneal and mesenteric lymph nodes measuring 9 mm in short axis on image 52/2 similar to prior CT dated  July 17, 2018. Reproductive: Prostate is unremarkable. Other: No walled off fluid collections. Small volume pelvic and mesenteric fluid. No pneumoperitoneum. No portal venous gas. No evidence of perianal disease. Musculoskeletal: Minimally progressed symmetric erosive changes and surrounding sclerosis involving the bilateral SI joints consistent with sacroiliitis. IMPRESSION: 1. Constellation of findings most consistent with acute on chronic Crohn's disease with multifocal bowel involvement and possible ileal stricture without high-grade obstruction. 2. No walled off fluid collections or evidence of perforation. 3. No perianal disease. 4. Prominent retroperitoneal and mesenteric lymph nodes are similar prior most consistent with a reactive etiology. 5. Similar symmetric bilateral sacroiliitis. 6.  Aortic Atherosclerosis (ICD10-I70.0). Electronically Signed   By: JDahlia BailiffM.D.   On: 11/02/2021 09:56    ____________________________________________   PROCEDURES  Procedure(s) performed:   Procedures  None  ____________________________________________   INITIAL IMPRESSION / ASSESSMENT AND PLAN / ED COURSE  Pertinent labs & imaging results that were available during my care of the patient were reviewed by me and considered in my medical decision making (see chart for details).   This patient is Presenting for Evaluation of abdominal pain, which does require a range of treatment options, and is a complaint that involves a high risk of morbidity and mortality.  The Differential Diagnoses includes but is not exclusive to acute cholecystitis, intrathoracic causes for epigastric abdominal pain, gastritis, duodenitis, pancreatitis, small bowel or large bowel obstruction, abdominal aortic aneurysm, hernia, gastritis, etc.   Critical Interventions-    Medications  sodium chloride 0.9 % bolus 1,000 mL (1,000 mLs Intravenous New Bag/Given 11/02/21 0842)  ondansetron (ZOFRAN) injection  4 mg (4 mg  Intravenous Given 11/02/21 0843)  iohexol (OMNIPAQUE) 300 MG/ML solution 100 mL (80 mLs Intravenous Contrast Given 11/02/21 0931)    Reassessment after intervention:  Symptoms improved.    I did obtain Additional Historical Information from mother at bedside.   I decided to review pertinent External Data, and in summary patient with prior ED presentations for abscess related to hidradenitis.   Clinical Laboratory Tests Ordered, included leukocytosis to 18.6 likely related to some component of hemoconcentration.  Creatinine normal.  LFTs and alk phos mildly elevated with normal bilirubin.  Lipase normal.  Radiologic Tests Ordered, included CT abdomen/pelvis. I independently interpreted the images and agree with radiology interpretation.   Cardiac Monitor Tracing which shows NSR.   Social Determinants of Health Risk patient is a non-smoker.   Consult complete with Dr. Collene Mares with GI on call.  Viewed the case, lab results, CT image findings.  Agree with no clear indication for antibiotics or other emergent treatment.  Plan for symptom management and they will arrange close outpatient follow-up for colonoscopy to further evaluate the possibility of underlying Crohn's disease.  No complication on CT such as obstruction or abscess formation.  Medical Decision Making: Summary:  Patient presents to the emergency department with abdominal pain along with nausea and vomiting.  Overall abdomen is fairly soft but do have some more focal tenderness in the right lower quadrant.  He has history of hidradenitis but none of the areas identified on exam appear to be acutely infected or require incision and drainage.  I do plan for CT abdomen pelvis along with blood work although my suspicion for viral process is elevated.  Will give IV fluids and Zofran for symptoms and reassess.   Reevaluation with update and discussion with patient.  Plan for symptom management as an outpatient and advise close follow-up with GI  as above.   Considered admission but patient appears reasonably screened and after discussing with GI feel that close outpatient follow-up with that service is reasonable at this time.  Strict ED return return precautions given.  Disposition: discharge  ____________________________________________  FINAL CLINICAL IMPRESSION(S) / ED DIAGNOSES  Final diagnoses:  Nausea and vomiting, unspecified vomiting type  Right lower quadrant abdominal pain     NEW OUTPATIENT MEDICATIONS STARTED DURING THIS VISIT:  New Prescriptions   DICYCLOMINE (BENTYL) 20 MG TABLET    Take 1 tablet (20 mg total) by mouth 3 (three) times daily as needed for spasms.   LOPERAMIDE (IMODIUM) 2 MG CAPSULE    Take 1 capsule (2 mg total) by mouth 4 (four) times daily as needed for diarrhea or loose stools.   ONDANSETRON (ZOFRAN-ODT) 4 MG DISINTEGRATING TABLET    Take 1 tablet (4 mg total) by mouth every 8 (eight) hours as needed.    Note:  This document was prepared using Dragon voice recognition software and may include unintentional dictation errors.  Nanda Quinton, MD, [facility]    Long, Wonda [employer], MD 11/03/21 [phone]

## 2021-11-02 NOTE — ED Notes (Signed)
Patient transported to CT 

## 2021-11-02 NOTE — Discharge Instructions (Signed)
You were seen in the emergency room today with abdominal pain and vomiting.  I suspect this may be from a viral infection but if you develop fever or worsening abdominal pain you to return to the emergency department for reevaluation.  Have called in several medicines to help with your symptoms.  As we discussed, your CT scan showed some findings concerning for inflammatory bowel disease.  This needs to be urgently followed up on with a GI doctor.  I have spoken with the GI doctor on-call and have listed their contact information here on the form.  Please call them today for an appointment.  They will need to schedule close follow-up and possibly a colonoscopy.

## 2021-11-02 NOTE — ED Notes (Signed)
Discharge paperwork given and verbally understood. 

## 2021-11-02 NOTE — ED Triage Notes (Signed)
Pt arrives to ED with c/o weakness and vomiting. Pt reports yesterday began to feel weak and early this morning he has had constant vomiting. Associated symptoms include diarrhea.

## 2021-12-30 ENCOUNTER — Other Ambulatory Visit: Payer: Self-pay

## 2021-12-30 ENCOUNTER — Encounter (HOSPITAL_BASED_OUTPATIENT_CLINIC_OR_DEPARTMENT_OTHER): Payer: Self-pay

## 2021-12-30 ENCOUNTER — Other Ambulatory Visit (HOSPITAL_BASED_OUTPATIENT_CLINIC_OR_DEPARTMENT_OTHER): Payer: Self-pay

## 2021-12-30 ENCOUNTER — Emergency Department (HOSPITAL_BASED_OUTPATIENT_CLINIC_OR_DEPARTMENT_OTHER)
Admission: EM | Admit: 2021-12-30 | Discharge: 2021-12-30 | Disposition: A | Discharge status: Home / Self Care | Payer: BC Managed Care – PPO | Attending: Emergency Medicine | Admitting: Emergency Medicine

## 2021-12-30 DIAGNOSIS — L02411 Cutaneous abscess of right axilla: Secondary | ICD-10-CM | POA: Diagnosis not present

## 2021-12-30 DIAGNOSIS — Z91010 Allergy to peanuts: Secondary | ICD-10-CM | POA: Insufficient documentation

## 2021-12-30 MED ORDER — LIDOCAINE-EPINEPHRINE (PF) 2 %-1:200000 IJ SOLN
20.0000 mL | Freq: Once | INTRAMUSCULAR | Status: DC
  Filled 2021-12-30: qty 20

## 2021-12-30 NOTE — ED Triage Notes (Signed)
Pt presents POV from home with an abscess to his Right axillary area around an old surgical site from a surgery 2021 Pt also reports "chest pulling" when he sneezes

## 2021-12-30 NOTE — ED Notes (Signed)
Suture cart at bedside 

## 2021-12-30 NOTE — Discharge Instructions (Addendum)
Apply warm compresses to the area of incision.  Keep the area clean and dry and wash with soap and water.  If you develop fever, new or worsening swelling or pain, or any other new/concerning symptoms then return to the ER for evaluation.

## 2021-12-30 NOTE — ED Provider Notes (Signed)
[facility] EMERGENCY DEPT Provider Note   CSN: [id number] Arrival date & time: 12/30/21  1111     History  Chief Complaint  Patient presents with   Abscess   Chest Pain    Mark [name] is a 51 y.o. male.  HPI 51 year old male presents with concern for a recurrent axillary abscess on the right side.  He noticed it 3 days ago.  Got a lot worse yesterday.  He had a little bit of a headache that comes with it but he has not had an actual fever.  No drainage.  He had previous surgery there for hidradenitis.  There is also note about chest pain.  I discussed this with the patient he states that he sneezed 3 days ago.  He felt a soreness and tenderness into his left anterior chest that has been gone for the last few days.  It has not recurred and he has no current chest pain.  Home Medications Prior to Admission medications   Medication Sig Start Date End Date Taking? Authorizing Provider  amLODipine (NORVASC) 10 MG tablet Take 1 tablet (10 mg total) by mouth daily. 04/08/19   Bing Neighbors, FNP  dicyclomine (BENTYL) 20 MG tablet Take 1 tablet (20 mg total) by mouth 3 (three) times daily as needed for spasms. 11/02/21   Long, Arlyss Repress, MD  ENBREL SURECLICK 50 MG/ML injection Inject 50 mg into the skin every Saturday.  06/15/18   [provider]  HYDROcodone-acetaminophen (NORCO) 5-325 MG tablet Take 1 tablet by mouth every 6 (six) hours as needed for severe pain. 02/21/21   Molpus, Jonny Ruiz, MD  loperamide (IMODIUM) 2 MG capsule Take 1 capsule (2 mg total) by mouth 4 (four) times daily as needed for diarrhea or loose stools. 11/02/21   Long, Arlyss Repress, MD  naproxen (NAPROSYN) 375 MG tablet Take 1 tablet twice daily for shoulder pain. 02/21/21   Molpus, John, MD  ondansetron (ZOFRAN-ODT) 4 MG disintegrating tablet Take 1 tablet (4 mg total) by mouth every 8 (eight) hours as needed. 11/02/21   Long, Arlyss Repress, MD      Allergies    Peanut-containing drug products     Review of Systems   Review of Systems  Constitutional:  Negative for fever.  Respiratory:  Negative for shortness of breath.     Physical Exam Updated Vital Signs BP (!) 175/91   Pulse 70   Temp 99.2 F (37.3 C) (Oral)   Resp (!) 21   SpO2 100%  Physical Exam Vitals and nursing note reviewed.  Constitutional:      Appearance: He is well-developed.  HENT:     Head: Normocephalic and atraumatic.  Cardiovascular:     Rate and Rhythm: Normal rate and regular rhythm.     Heart sounds: Normal heart sounds.  Pulmonary:     Effort: Pulmonary effort is normal.     Breath sounds: Normal breath sounds.  Chest:     Chest wall: No tenderness.     Comments: In right right axilla he has a chronic horizontal scar from a surgical wound. On the superior lateral aspect there is a firm area of tenderness/swelling. Feels fluctuance. Perhaps mild erythema. ~ 2-3 cm in diameter Skin:    General: Skin is warm and dry.  Neurological:     Mental Status: He is alert.     ED Results / Procedures / Treatments   Labs (all labs ordered are listed, but only abnormal results are displayed) Labs Reviewed -  No data to display  EKG EKG Interpretation  Date/Time:  Friday December 30 2021 11:30:32 EDT Ventricular Rate:  80 PR Interval:  152 QRS Duration: 90 QT Interval:  366 QTC Calculation: 422 R Axis:   47 Text Interpretation: Normal sinus rhythm no acute ST/T changes similar to May 2020 Confirmed by Sherwood Gambler [phone]) on 12/30/2021 3:30:42 PM  Radiology No results found.  Procedures .Marland KitchenIncision and Drainage  Date/Time: 12/30/2021 4:24 PM  Performed by: Sherwood Gambler, MD Authorized by: Sherwood Gambler, MD   Consent:    Consent obtained:  Verbal   Consent given by:  Patient Location:    Type:  Abscess   Size:  2.5 cm   Location:  Upper extremity   Upper extremity location: right axilla. Pre-procedure details:    Skin preparation:  Povidone-iodine Sedation:    Sedation  type:  None Anesthesia:    Anesthesia method:  Local infiltration   Local anesthetic:  Lidocaine 2% WITH epi Procedure type:    Complexity:  Simple Procedure details:    Incision types:  Single straight   Incision depth:  Dermal   Wound management:  Probed and deloculated   Drainage:  Purulent   Drainage amount:  Moderate   Wound treatment:  Wound left open Post-procedure details:    Procedure completion:  Tolerated well, no immediate complications     Medications Ordered in ED Medications  lidocaine-EPINEPHrine (XYLOCAINE W/EPI) 2 %-1:200000 (PF) injection 20 mL (has no administration in time range)    ED Course/ Medical Decision Making/ A&P                           Medical Decision Making Risk Prescription drug management.   Patient is found to have a right axillary abscess.  Incised and drained without difficulty as above.  Fair amount of purulent discharge.  No overlying cellulitis or systemic symptoms.  He is hypertensive which seems to be a chronic problem on chart review but he also states he is nervous.  He was instructed to follow-up with PCP about this as he has no signs or symptoms of endorgan damage.  Given his history of rheumatoid arthritis and history of treatment for such, I discussed antibiotics for a few days versus not.  Given it is a small abscess we had shared decision making and we decided to hold off.  Discussed supportive care and return precautions.  There was also the note about the chest wall pain after sneezing but this was 3 days ago and has had no recurrence.  Clear lungs and no tenderness.  Highly doubt pneumothorax, ACS, etc.  EKG that was obtained is unremarkable on my interpretation.  No indication for further work-up.        Final Clinical Impression(s) / ED Diagnoses Final diagnoses:  Abscess of axilla, right    Rx / DC Orders ED Discharge Orders     None         Sherwood Gambler, MD 12/30/21 1626

## 2022-03-13 ENCOUNTER — Emergency Department (HOSPITAL_BASED_OUTPATIENT_CLINIC_OR_DEPARTMENT_OTHER): Payer: BC Managed Care – PPO

## 2022-03-13 ENCOUNTER — Encounter (HOSPITAL_BASED_OUTPATIENT_CLINIC_OR_DEPARTMENT_OTHER): Payer: Self-pay | Admitting: Emergency Medicine

## 2022-03-13 ENCOUNTER — Emergency Department (HOSPITAL_BASED_OUTPATIENT_CLINIC_OR_DEPARTMENT_OTHER)
Admission: EM | Admit: 2022-03-13 | Discharge: 2022-03-14 | Disposition: A | Discharge status: Home / Self Care | Payer: BC Managed Care – PPO | Attending: Emergency Medicine | Admitting: Emergency Medicine

## 2022-03-13 ENCOUNTER — Other Ambulatory Visit: Payer: Self-pay

## 2022-03-13 DIAGNOSIS — L03011 Cellulitis of right finger: Secondary | ICD-10-CM | POA: Insufficient documentation

## 2022-03-13 DIAGNOSIS — Z79899 Other long term (current) drug therapy: Secondary | ICD-10-CM | POA: Diagnosis not present

## 2022-03-13 DIAGNOSIS — M79644 Pain in right finger(s): Secondary | ICD-10-CM | POA: Diagnosis present

## 2022-03-13 DIAGNOSIS — I10 Essential (primary) hypertension: Secondary | ICD-10-CM | POA: Insufficient documentation

## 2022-03-13 DIAGNOSIS — Z91010 Allergy to peanuts: Secondary | ICD-10-CM | POA: Insufficient documentation

## 2022-03-13 NOTE — ED Triage Notes (Signed)
Pt c/o pain to right middle finger around nail with redness and swelling x 3-4 days; pt further c/o pain toe pain to left foot with hx of RA

## 2022-03-13 NOTE — ED Notes (Signed)
Pt reports hx of HTN and is unsure when he last took BP meds

## 2022-03-14 MED ORDER — ACETAMINOPHEN 500 MG PO TABS
1000.0000 mg | ORAL_TABLET | Freq: Once | ORAL | Status: AC
  Administered 2022-03-14: 1000 mg via ORAL
  Filled 2022-03-14: qty 2

## 2022-03-14 MED ORDER — CEPHALEXIN 500 MG PO CAPS: 500.0000 mg | ORAL_CAPSULE | Freq: Two times a day (BID) | ORAL | 0 refills | Status: DC

## 2022-03-14 NOTE — ED Provider Notes (Signed)
[facility] EMERGENCY DEPT Provider Note   CSN: [id number] Arrival date & time: 03/13/22  1846     History  Chief Complaint  Patient presents with   Hand Pain    Kevork Joyce is a 51 y.o. male.  The history is provided by the patient.  Hand Pain This is a new problem. The current episode started more than 2 days ago. The problem occurs constantly. The problem has not changed since onset.Pertinent negatives include no chest pain, no abdominal pain, no headaches and no shortness of breath. Nothing aggravates the symptoms. He has tried nothing for the symptoms. The treatment provided no relief.  Swelling about the cuticle of the right middle finger x 4 days      Home Medications Prior to Admission medications   Medication Sig Start Date End Date Taking? Authorizing Provider  amLODipine (NORVASC) 10 MG tablet Take 1 tablet (10 mg total) by mouth daily. 04/08/19   Bing Neighbors, FNP  dicyclomine (BENTYL) 20 MG tablet Take 1 tablet (20 mg total) by mouth 3 (three) times daily as needed for spasms. 11/02/21   Long, Arlyss Repress, MD  ENBREL SURECLICK 50 MG/ML injection Inject 50 mg into the skin every Saturday.  06/15/18   [provider]  HYDROcodone-acetaminophen (NORCO) 5-325 MG tablet Take 1 tablet by mouth every 6 (six) hours as needed for severe pain. 02/21/21   Molpus, Jonny Ruiz, MD  loperamide (IMODIUM) 2 MG capsule Take 1 capsule (2 mg total) by mouth 4 (four) times daily as needed for diarrhea or loose stools. 11/02/21   Long, Arlyss Repress, MD  naproxen (NAPROSYN) 375 MG tablet Take 1 tablet twice daily for shoulder pain. 02/21/21   Molpus, John, MD  ondansetron (ZOFRAN-ODT) 4 MG disintegrating tablet Take 1 tablet (4 mg total) by mouth every 8 (eight) hours as needed. 11/02/21   Long, Arlyss Repress, MD      Allergies    Peanut-containing drug products    Review of Systems   Review of Systems  Constitutional:  Negative for fever.  HENT:  Negative for facial swelling.    Eyes:  Negative for redness.  Respiratory:  Negative for shortness of breath.   Cardiovascular:  Negative for chest pain.  Gastrointestinal:  Negative for abdominal pain.  Musculoskeletal:  Positive for arthralgias.  Neurological:  Negative for headaches.  All other systems reviewed and are negative.   Physical Exam Updated Vital Signs BP (!) 172/98   Pulse 74   Temp 99.9 F (37.7 C) (Oral)   Resp 16   Ht 5\' 10"  (1.778 m)   Wt 74.8 kg   SpO2 99%   BMI 23.68 kg/m  Physical Exam Vitals and nursing note reviewed.  Constitutional:      General: He is not in acute distress.    Appearance: He is well-developed. He is not diaphoretic.  HENT:     Head: Normocephalic and atraumatic.     Nose: Nose normal.  Eyes:     Conjunctiva/sclera: Conjunctivae normal.     Pupils: Pupils are equal, round, and reactive to light.  Cardiovascular:     Rate and Rhythm: Normal rate and regular rhythm.  Pulmonary:     Effort: Pulmonary effort is normal.     Breath sounds: Normal breath sounds. No wheezing or rales.  Abdominal:     General: Bowel sounds are normal.     Palpations: Abdomen is soft.     Tenderness: There is no abdominal tenderness. There is no guarding  or rebound.  Musculoskeletal:       Arms:     Cervical back: Normal range of motion and neck supple.  Skin:    General: Skin is warm and dry.     Capillary Refill: Capillary refill takes less than 2 seconds.  Neurological:     General: No focal deficit present.     Mental Status: He is alert and oriented to person, place, and time.  Psychiatric:        Mood and Affect: Mood normal.        Behavior: Behavior normal.     ED Results / Procedures / Treatments   Labs (all labs ordered are listed, but only abnormal results are displayed) Labs Reviewed - No data to display  EKG None  Radiology DG Finger Middle Right  Result Date: 03/13/2022 CLINICAL DATA:  Third digit pain near the nail with redness and swelling,  initial encounter EXAM: RIGHT MIDDLE FINGER 2+V COMPARISON:  None Available. FINDINGS: Distal soft tissue swelling is noted. No bony erosive changes are seen. No foreign body is noted. IMPRESSION: Soft tissue swelling without acute bony abnormality. Electronically Signed   By: Alcide Clever M.D.   On: 03/13/2022 19:55    Procedures .Marland KitchenIncision and Drainage  Date/Time: 03/14/2022 1:23 AM  Performed by: Cy Blamer, MD Authorized by: Cy Blamer, MD   Consent:    Consent obtained:  Verbal   Consent given by:  Patient   Risks, benefits, and alternatives were discussed: yes     Risks discussed:  Bleeding and incomplete drainage   Alternatives discussed:  No treatment Universal protocol:    Patient identity confirmed:  Arm band Location:    Indications for incision and drainage: paronychia.   Location: R middle finger. Pre-procedure details:    Skin preparation:  Chlorhexidine and povidone-iodine Sedation:    Sedation type:  None Anesthesia:    Anesthesia method:  None Procedure type:    Complexity:  Simple Procedure details:    Incision types:  Single straight   Incision depth:  Subcutaneous   Drainage:  Purulent   Drainage amount:  Copious   Wound treatment:  Wound left open   Packing materials:  None Post-procedure details:    Procedure completion:  Tolerated well, no immediate complications     Medications Ordered in ED Medications  acetaminophen (TYLENOL) tablet 1,000 mg (has no administration in time range)    ED Course/ Medical Decision Making/ A&P                           Medical Decision Making Patient with paronychia x 4 days   Amount and/or Complexity of Data Reviewed External Data Reviewed: notes.    Details: Previous notes reviewed  Radiology: ordered.  Risk OTC drugs. Risk Details: Well appearing.  Normal vital signs. Paronychia ID and sterilely dressed.  No submersion for 14 days.  Stable for discharge.  Will start keflex.      Final  Clinical Impression(s) / ED Diagnoses Final diagnoses:  Paronychia of finger, right  Return for intractable cough, coughing up blood, fevers > 100.4 unrelieved by medication, shortness of breath, intractable vomiting, chest pain, shortness of breath, weakness, numbness, changes in speech, facial asymmetry, abdominal pain, passing out, Inability to tolerate liquids or food, cough, altered mental status or any concerns. No signs of systemic illness or infection. The patient is nontoxic-appearing on exam and vital signs are within normal limits.  I have reviewed  the triage vital signs and the nursing notes. Pertinent labs & imaging results that were available during my care of the patient were reviewed by me and considered in my medical decision making (see chart for details). After history, exam, and medical workup I feel the patient has been appropriately medically screened and is safe for discharge home. Pertinent diagnoses were discussed with the patient. Patient was given return precautions.     Rx / DC Orders ED Discharge Orders     None         Carlosdaniel Grob, MD 03/14/22 2831

## 2022-06-20 ENCOUNTER — Encounter (HOSPITAL_COMMUNITY): Payer: Self-pay | Admitting: Emergency Medicine

## 2022-06-20 ENCOUNTER — Emergency Department (HOSPITAL_COMMUNITY)
Admission: EM | Admit: 2022-06-20 | Discharge: 2022-06-20 | Disposition: A | Discharge status: Home / Self Care | Payer: BC Managed Care – PPO | Attending: Emergency Medicine | Admitting: Emergency Medicine

## 2022-06-20 DIAGNOSIS — L02214 Cutaneous abscess of groin: Secondary | ICD-10-CM | POA: Diagnosis not present

## 2022-06-20 DIAGNOSIS — Z91010 Allergy to peanuts: Secondary | ICD-10-CM | POA: Diagnosis not present

## 2022-06-20 DIAGNOSIS — L0291 Cutaneous abscess, unspecified: Secondary | ICD-10-CM

## 2022-06-20 LAB — CBC WITH DIFFERENTIAL/PLATELET
Abs Immature Granulocytes: 0.04 10*3/uL (ref 0.00–0.07)
Basophils Absolute: 0.1 10*3/uL (ref 0.0–0.1)
Basophils Relative: 1 %
Eosinophils Absolute: 0.3 10*3/uL (ref 0.0–0.5)
Eosinophils Relative: 2 %
HCT: 37.5 % — ABNORMAL LOW (ref 39.0–52.0)
Hemoglobin: 12.5 g/dL — ABNORMAL LOW (ref 13.0–17.0)
Immature Granulocytes: 0 %
Lymphocytes Relative: 19 %
Lymphs Abs: 2.2 10*3/uL (ref 0.7–4.0)
MCH: 29.8 pg (ref 26.0–34.0)
MCHC: 33.3 g/dL (ref 30.0–36.0)
MCV: 89.3 fL (ref 80.0–100.0)
Monocytes Absolute: 0.8 10*3/uL (ref 0.1–1.0)
Monocytes Relative: 7 %
Neutro Abs: 8.6 10*3/uL — ABNORMAL HIGH (ref 1.7–7.7)
Neutrophils Relative %: 71 %
Platelets: 403 10*3/uL — ABNORMAL HIGH (ref 150–400)
RBC: 4.2 MIL/uL — ABNORMAL LOW (ref 4.22–5.81)
RDW: 15.7 % — ABNORMAL HIGH (ref 11.5–15.5)
WBC: 12 10*3/uL — ABNORMAL HIGH (ref 4.0–10.5)
nRBC: 0 % (ref 0.0–0.2)

## 2022-06-20 LAB — BASIC METABOLIC PANEL
Anion gap: 7 (ref 5–15)
BUN: 15 mg/dL (ref 6–20)
CO2: 26 mmol/L (ref 22–32)
Calcium: 8.6 mg/dL — ABNORMAL LOW (ref 8.9–10.3)
Chloride: 104 mmol/L (ref 98–111)
Creatinine, Ser: 0.98 mg/dL (ref 0.61–1.24)
GFR, Estimated: >60 mL/min (ref >60)
Glucose, Bld: 86 mg/dL (ref 70–99)
Potassium: 3.9 mmol/L (ref 3.5–5.1)
Sodium: 137 mmol/L (ref 135–145)

## 2022-06-20 MED ORDER — SULFAMETHOXAZOLE-TRIMETHOPRIM 800-160 MG PO TABS
1.0000 | ORAL_TABLET | Freq: Once | ORAL | Status: AC
  Administered 2022-06-20: 1 via ORAL
  Filled 2022-06-20: qty 1

## 2022-06-20 MED ORDER — SULFAMETHOXAZOLE-TRIMETHOPRIM 800-160 MG PO TABS: 1.0000 | ORAL_TABLET | Freq: Two times a day (BID) | ORAL | 0 refills | Status: AC

## 2022-06-20 NOTE — ED Provider Notes (Signed)
[facility] Provider Note   CSN: [id number] Arrival date & time: 06/20/22  1217     History  Chief Complaint  Patient presents with   Abscess    Mark [name] is a 52 y.o. male With a past medical history of recurrent abscesses presenting today with an abscess.  He reports he first noticed an abscess to the right inguinal fold Thursday night.  He said that this ruptured on its own however the next day he noted another abscess.  He says that that has since started to drain purulent/serosanguineous fluid.  No fevers or chills.  Does not believe it involves his testicles.  No testicular tenderness.  Says that he has a history of perirectal abscess that required treatment in the OR and wanted to make sure everything is okay.  No history of diabetes or a chest.  Says that he has rheumatoid arthritis and he believes his Enbrel is causing these abscesses.   Abscess      Home Medications Prior to Admission medications   Medication Sig Start Date End Date Taking? Authorizing Provider  sulfamethoxazole-trimethoprim (BACTRIM DS) 800-160 MG tablet Take 1 tablet by mouth 2 (two) times daily for 7 days. 06/20/22 06/27/22 Yes Margo Lama A, PA-C  amLODipine (NORVASC) 10 MG tablet Take 1 tablet (10 mg total) by mouth daily. 04/08/19   Scot Jun, NP  cephALEXin (KEFLEX) 500 MG capsule Take 1 capsule (500 mg total) by mouth 2 (two) times daily. 03/14/22   Palumbo, April, MD  dicyclomine (BENTYL) 20 MG tablet Take 1 tablet (20 mg total) by mouth 3 (three) times daily as needed for spasms. 11/02/21   Long, Wonda Olds, MD  ENBREL SURECLICK 50 MG/ML injection Inject 50 mg into the skin every Saturday.  06/15/18   [provider]  HYDROcodone-acetaminophen (NORCO) 5-325 MG tablet Take 1 tablet by mouth every 6 (six) hours as needed for severe pain. 02/21/21   Molpus, Jenny Reichmann, MD  loperamide (IMODIUM) 2 MG capsule Take 1 capsule (2 mg total) by mouth  4 (four) times daily as needed for diarrhea or loose stools. 11/02/21   Long, Wonda Olds, MD  naproxen (NAPROSYN) 375 MG tablet Take 1 tablet twice daily for shoulder pain. 02/21/21   Molpus, John, MD  ondansetron (ZOFRAN-ODT) 4 MG disintegrating tablet Take 1 tablet (4 mg total) by mouth every 8 (eight) hours as needed. 11/02/21   Long, Wonda Olds, MD      Allergies    Peanut-containing drug products    Review of Systems   Review of Systems  Physical Exam Updated Vital Signs BP (!) 152/102 (BP Location: Right Arm)   Pulse 90   Temp 98.4 F (36.9 C) (Oral)   Resp 15   SpO2 99%  Physical Exam Vitals and nursing note reviewed.  Constitutional:      Appearance: Normal appearance.  HENT:     Head: Normocephalic and atraumatic.  Eyes:     General: No scleral icterus.    Conjunctiva/sclera: Conjunctivae normal.  Pulmonary:     Effort: Pulmonary effort is normal. No respiratory distress.  Genitourinary:    Penis: Normal.      Testes: Normal.     Rectum: Normal.     Comments: GU exam performed in presence of PA student and RN.  Patient had some induration to the right inguinal fold.  No tenderness or induration to the testicle.  There is one small area of induration on to the  right groin that drains some odorous, serosanguineous fluid.  Does not involve the perineum or anus. Skin:    Findings: No rash.  Neurological:     Mental Status: He is alert.  Psychiatric:        Mood and Affect: Mood normal.     ED Results / Procedures / Treatments   Labs (all labs ordered are listed, but only abnormal results are displayed) Labs Reviewed  BASIC METABOLIC PANEL - Abnormal; Notable for the following components:      Result Value   Calcium 8.6 (*)    All other components within normal limits  CBC WITH DIFFERENTIAL/PLATELET - Abnormal; Notable for the following components:   WBC 12.0 (*)    RBC 4.20 (*)    Hemoglobin 12.5 (*)    HCT 37.5 (*)    RDW 15.7 (*)    Platelets 403 (*)     Neutro Abs 8.6 (*)    All other components within normal limits    EKG None  Radiology No results found.  Procedures Procedures   Medications Ordered in ED Medications  sulfamethoxazole-trimethoprim (BACTRIM DS) 800-160 MG per tablet 1 tablet (1 tablet Oral Given 06/20/22 1542)    ED Course/ Medical Decision Making/ A&P                             Medical Decision Making Risk Prescription drug management.  52 year old male presenting today with concern for an abscess to the right inguinal fold.  Differential includes simple abscess, cellulitis, necrotizing infection.  Workup: Basic labs ordered in triage.  WBC of 12.  Physical exam: Cellulitis/induration to the right groin.  Small area draining odorous serosanguineous fluid.  No testicular, perineal or rectal involvement  Treatment: Bactrim  MDM/disposition: 52 year old male presenting with an abscess.  He has a history of perirectal abscesses.  This has no rectal involvement.  Lab work was ordered in triage that showed a small WBC count to 12.  He is afebrile, not tachycardic.  Do not believe any lactic, cultures or CT imaging.  The area is already draining so we will assist this natural drainage with Bactrim.  Strict return precautions were given and patient reports he has an appointment with his PCP later this week.  He continues to do sitz baths.  Stable and agreeable to discharge.   Final Clinical Impression(s) / ED Diagnoses Final diagnoses:  Abscess    Rx / DC Orders ED Discharge Orders          Ordered    sulfamethoxazole-trimethoprim (BACTRIM DS) 800-160 MG tablet  2 times daily        06/20/22 1539           Results and diagnoses were explained to the patient. Return precautions discussed in full. Patient had no additional questions and expressed complete understanding.   This chart was dictated using voice recognition software.  Despite best efforts to proofread,  errors can occur which can change  the documentation meaning.    Darliss Ridgel 06/20/22 1549    Noemi Chapel, MD 06/21/22 [phone]

## 2022-06-20 NOTE — ED Provider Triage Note (Signed)
Emergency Medicine Provider Triage Evaluation Note  Mark [name] , a 52 y.o. male  was evaluated in triage.  Pt complains of right groin abscess that has been present for the past 5 days.  Patient states he has a history of at bedtime and has had intra-abdominal abscesses before.  Patient states he has had brown drainage in that the abscess is extremely painful.  Abscess is located next to his right that was draining clear on his inner thigh but patient denied any testicular pain.  Patient states he has rheumatoid arthritis and is currently on Enbrel.  Patient denied any fevers, nausea/vomiting, purulent drainage, masses  Review of Systems  Positive: See HPI Negative: See HPI  Physical Exam  BP (!) 152/102 (BP Location: Right Arm)   Pulse 90   Temp 98.4 F (36.9 C) (Oral)   Resp 15   SpO2 99%  Gen:   Awake, no distress   Resp:  Normal effort  MSK:   Moves extremities without difficulty  Other:  Abscess noted on the right inner inguinal crease by right testicle that was draining clear fluid when patient pressed on it, patient said abscess was tender to palpation, no signs of gangrene  Medical Decision Making  Medically screening exam initiated at 12:31 PM.  Appropriate orders placed.  Mark [name] was informed that the remainder of the evaluation will be completed by another provider, this initial triage assessment does not replace that evaluation, and the importance of remaining in the ED until their evaluation is complete.  Workup initiated, due to patient having reported history of intra-abdominal abscesses a CT scan was ordered, patient stable at this time   Mark [name] 06/20/22 1232

## 2022-06-20 NOTE — ED Triage Notes (Signed)
Pt states he has an abscess in his right groin that - on Thursday it started getting larger and draining bloody/brown fluid. He states this abscess has been present for a while before it got larger last week.

## 2022-06-20 NOTE — Discharge Instructions (Addendum)
You came to the emergency department today due to an abscess to your right groin.  As we discussed, the swollen tissue is called induration.  This is not something that can be drained.  You do have an abscess that is already open and draining and we do not need to perform any procedures at this time.  You are being started on an antibiotic called Bactrim.  Please take this as prescribed.  Take it with food as it may upset your stomach.  Additionally, make an appointment with your PCP to have the area rechecked  Do not hesitate to return with any worsening symptoms, especially fevers, chills or worsening swelling.  Use Tylenol and ibuprofen and continue to do the baths at least once daily. It was a pleasure to meet you and we hope you feel better!

## 2022-10-26 ENCOUNTER — Encounter (HOSPITAL_BASED_OUTPATIENT_CLINIC_OR_DEPARTMENT_OTHER): Payer: Self-pay | Admitting: Family Medicine

## 2022-10-26 ENCOUNTER — Ambulatory Visit (HOSPITAL_BASED_OUTPATIENT_CLINIC_OR_DEPARTMENT_OTHER): Payer: BC Managed Care – PPO | Admitting: Family Medicine

## 2022-10-26 ENCOUNTER — Other Ambulatory Visit (HOSPITAL_BASED_OUTPATIENT_CLINIC_OR_DEPARTMENT_OTHER): Payer: Self-pay

## 2022-10-26 VITALS — BP 192/105 | HR 62 | Ht 70.0 in | Wt 172.0 lb

## 2022-10-26 DIAGNOSIS — M069 Rheumatoid arthritis, unspecified: Secondary | ICD-10-CM | POA: Diagnosis not present

## 2022-10-26 DIAGNOSIS — Z1211 Encounter for screening for malignant neoplasm of colon: Secondary | ICD-10-CM

## 2022-10-26 DIAGNOSIS — I10 Essential (primary) hypertension: Secondary | ICD-10-CM | POA: Diagnosis not present

## 2022-10-26 DIAGNOSIS — L732 Hidradenitis suppurativa: Secondary | ICD-10-CM | POA: Diagnosis not present

## 2022-10-26 DIAGNOSIS — Z0000 Encounter for general adult medical examination without abnormal findings: Secondary | ICD-10-CM

## 2022-10-26 MED ORDER — SULFAMETHOXAZOLE-TRIMETHOPRIM 800-160 MG PO TABS
1.0000 | ORAL_TABLET | Freq: Two times a day (BID) | ORAL | 0 refills | Status: DC
  Filled 2022-10-26: qty 14, 7d supply, fill #0

## 2022-10-26 MED ORDER — AMLODIPINE BESYLATE 5 MG PO TABS
5.0000 mg | ORAL_TABLET | Freq: Every day | ORAL | 1 refills | Status: DC
  Filled 2022-10-26: qty 90, 90d supply, fill #0

## 2022-10-26 MED ORDER — VALSARTAN 80 MG PO TABS
80.0000 mg | ORAL_TABLET | Freq: Every day | ORAL | 1 refills | Status: DC
  Filled 2022-10-26: qty 90, 90d supply, fill #0

## 2022-10-26 NOTE — Progress Notes (Signed)
New Patient Office Visit  Subjective    Patient ID: Mark [name], male    DOB: October 18, 1970  Age: 52 y.o. MRN: [id number]  CC:  Chief Complaint  Patient presents with   GI Problem    Wants referral for colonoscopy    HPI Mark [name] presents to establish care Last PCP - [facility]  On review of chart, patient has history of elevated liver enzymes, hypertension, rheumatoid arthritis, hidradenitis suppurativa.  He has also had visits to the emergency department related to axillary abscess, paronychia of finger.  Rheumatoid arthritis: Patient previously was seeing Azucena Fallen, was being prescribed Humira at one point. Currently receiving Enbrel, follows with [facility].  Hypertension: Previous medications include amlodipine, chlorthalidone.  Presently, not taking any medications for blood pressure. Not checking blood pressure at home; does have cuff at home. Reports that prior meds "didn't agree with him." No current issues with chest pain or HA.  Requesting referral to dermatology related to concern for abscess vs hidradenitis suppurativa. Requesting referral to GI for screening colonoscopy. At end of appointment, patient also reports having area of swelling/possible infection in the right inguinal area.  He reports that he had evaluation at local emergency department and was prescribed antibiotics at that time due to similar issue.  He does indicate that antibiotics did provide improvement in symptoms, however recently started having some swelling and discomfort in the area again.  Denies any symptoms of fever, chills, sweats.  No drainage from the area, no bruising.  Patient is originally from [place]. Has been here 10 years. Works for [employer].  Outpatient Encounter Medications as of 10/26/2022  Medication Sig   acetaminophen (TYLENOL) 325 MG tablet Take 650 mg by mouth every 6 (six) hours as needed for mild pain or moderate pain.   amLODipine  (NORVASC) 5 MG tablet Take 1 tablet (5 mg total) by mouth daily.   ENBREL SURECLICK 50 MG/ML injection Inject 50 mg into the skin every Saturday.    sulfamethoxazole-trimethoprim (BACTRIM DS) 800-160 MG tablet Take 1 tablet by mouth 2 (two) times daily.   valsartan (DIOVAN) 80 MG tablet Take 1 tablet (80 mg total) by mouth daily.   [DISCONTINUED] amLODipine (NORVASC) 10 MG tablet Take 1 tablet (10 mg total) by mouth daily. (Patient not taking: Reported on 10/26/2022)   [DISCONTINUED] cephALEXin (KEFLEX) 500 MG capsule Take 1 capsule (500 mg total) by mouth 2 (two) times daily. (Patient not taking: Reported on 10/26/2022)   [DISCONTINUED] dicyclomine (BENTYL) 20 MG tablet Take 1 tablet (20 mg total) by mouth 3 (three) times daily as needed for spasms. (Patient not taking: Reported on 10/26/2022)   [DISCONTINUED] HYDROcodone-acetaminophen (NORCO) 5-325 MG tablet Take 1 tablet by mouth every 6 (six) hours as needed for severe pain. (Patient not taking: Reported on 10/26/2022)   [DISCONTINUED] loperamide (IMODIUM) 2 MG capsule Take 1 capsule (2 mg total) by mouth 4 (four) times daily as needed for diarrhea or loose stools. (Patient not taking: Reported on 10/26/2022)   [DISCONTINUED] naproxen (NAPROSYN) 375 MG tablet Take 1 tablet twice daily for shoulder pain. (Patient not taking: Reported on 10/26/2022)   [DISCONTINUED] ondansetron (ZOFRAN-ODT) 4 MG disintegrating tablet Take 1 tablet (4 mg total) by mouth every 8 (eight) hours as needed. (Patient not taking: Reported on 10/26/2022)   No facility-administered encounter medications on file as of 10/26/2022.    Past Medical History:  Diagnosis Date   Arthritis    RA   Hip fracture (HCC)  Hypertension    Rheumatoid arthritis (HCC)     Past Surgical History:  Procedure Laterality Date   HYDRADENITIS EXCISION Bilateral 09/18/2018   Procedure: EXCISION BILATERAL AXILLARY HIDRADENITIS;  Surgeon: Harriette Bouillon, MD;  Location: [facility];   Service: General;  Laterality: Bilateral;   INCISION AND DRAINAGE PERIRECTAL ABSCESS N/A 07/17/2018   Procedure: IRRIGATION AND DEBRIDEMENT PERIRECTAL ABSCESS;  Surgeon: Axel Filler, MD;  Location: [facility] OR;  Service: General;  Laterality: N/A;    Family History  Problem Relation Age of Onset   Heart failure Mother    Aneurysm Father     Social History   Socioeconomic History   Marital status: Divorced    Spouse name: Not on file   Number of children: Not on file   Years of education: Not on file   Highest education level: Not on file  Occupational History   Not on file  Tobacco Use   Smoking status: Never   Smokeless tobacco: Never  Vaping Use   Vaping status: Never Used  Substance and Sexual Activity   Alcohol use: Not Currently   Drug use: Not Currently    Frequency: 3.0 times per week   Sexual activity: Not on file  Other Topics Concern   Not on file  Social History Narrative   Not on file   Social Determinants of Health   Financial Resource Strain: Not on file  Food Insecurity: Food Insecurity Present (06/10/2020)   Received from [facility], [facility]   Hunger Vital Sign    Worried About Running Out of Food in the Last Year: Sometimes true    Ran Out of Food in the Last Year: Sometimes true  Transportation Needs: Not on file  Physical Activity: Not on file  Stress: Not on file  Social Connections: Unknown (08/15/2021)   Received from [facility], [facility]   Social Network    Social Network: Not on file  Intimate Partner Violence: Unknown (07/07/2021)   Received from [employer], [facility]   HITS    Physically Hurt: Not on file    Insult or Talk Down To: Not on file    Threaten Physical Harm: Not on file    Scream or Curse: Not on file    Objective    BP (!) 192/105 Comment: Repeat BP  Pulse 62   Ht 5\' 10"  (1.778 m)   Wt 172 lb (78 kg)   SpO2 100%   BMI 24.68 kg/m   Physical Exam  52 year old male in no acute  distress Cardiovascular exam with regular rate and rhythm Lungs clear to auscultation bilaterally Right inguinal region with some increased prominence, no obvious erythema, no fluctuance, some induration noted in soft tissue.  Assessment & Plan:   Rheumatoid arthritis, involving unspecified site, unspecified whether rheumatoid factor present (HCC)  Hidradenitis suppurativa Assessment & Plan: Can proceed with referral to dermatology for further evaluation and recommendations, did discuss general lifestyle measures to focus on  Orders: -     Ambulatory referral to Dermatology  Primary hypertension Assessment & Plan: Blood pressure notably elevated in office today, no significant change on recheck.  Given notable elevation, feel it would be reasonable to initiate pharmacotherapy.  Discussed options.  Will proceed with amlodipine and valsartan at low-dose, discussed potential risks and adverse reactions related to his medications.  Patient will return in about 2 weeks for labs due to initiating ARB, we will also complete general labs as well anticipation of physical We will  need to monitor blood pressure closely to ensure gradual improvement with initiation and adjustment of pharmacotherapy Recommend intermittent monitoring of blood pressure at home, DASH diet   Colon cancer screening -     Ambulatory referral to Gastroenterology  Wellness examination -     CBC with Differential/Platelet; Future -     Comprehensive metabolic panel; Future -     Hemoglobin A1c; Future -     Lipid panel; Future -     TSH Rfx on Abnormal to Free T4; Future  Other orders -     Sulfamethoxazole-Trimethoprim; Take 1 tablet by mouth 2 (two) times daily.  Dispense: 14 tablet; Refill: 0 -     amLODIPine Besylate; Take 1 tablet (5 mg total) by mouth daily.  Dispense: 90 tablet; Refill: 1 -     Valsartan; Take 1 tablet (80 mg total) by mouth daily.  Dispense: 90 tablet; Refill: 1  Area within right inguinal  region with induration present, no obvious fluctuance.  Concern of possible early stage of infection versus exacerbation of at bedtime.  Given prior issues, response to antibiotic therapy, can proceed with antibiotic at this time and monitoring symptoms, discussed precautions and symptoms to be aware of  Return in about 4 weeks (around 11/23/2022) for CPE with fasting labs 1 week prior.    ___________________________________________ Sabrena Gavitt de [place], MD, ABFM, CAQSM Primary Care and Sports Medicine [facility]

## 2022-10-27 NOTE — Assessment & Plan Note (Addendum)
Can proceed with referral to dermatology for further evaluation and recommendations, did discuss general lifestyle measures to focus on

## 2022-10-27 NOTE — Assessment & Plan Note (Signed)
Currently established with [facility] rheumatology, continues with Enbrel as prescribed by been, no concerns today Recommend continued follow-up with specialist

## 2022-10-27 NOTE — Assessment & Plan Note (Signed)
Blood pressure notably elevated in office today, no significant change on recheck.  Given notable elevation, feel it would be reasonable to initiate pharmacotherapy.  Discussed options.  Will proceed with amlodipine and valsartan at low-dose, discussed potential risks and adverse reactions related to his medications.  Patient will return in about 2 weeks for labs due to initiating ARB, we will also complete general labs as well anticipation of physical We will need to monitor blood pressure closely to ensure gradual improvement with initiation and adjustment of pharmacotherapy Recommend intermittent monitoring of blood pressure at home, DASH diet

## 2022-11-16 ENCOUNTER — Other Ambulatory Visit (HOSPITAL_BASED_OUTPATIENT_CLINIC_OR_DEPARTMENT_OTHER): Payer: BC Managed Care – PPO

## 2022-11-16 ENCOUNTER — Other Ambulatory Visit (HOSPITAL_BASED_OUTPATIENT_CLINIC_OR_DEPARTMENT_OTHER): Payer: Self-pay

## 2022-11-16 DIAGNOSIS — Z0000 Encounter for general adult medical examination without abnormal findings: Secondary | ICD-10-CM

## 2022-11-17 LAB — CBC WITH DIFFERENTIAL/PLATELET
Basophils Absolute: 0.1 10*3/uL (ref 0.0–0.2)
Basos: 1 %
EOS (ABSOLUTE): 0.2 10*3/uL (ref 0.0–0.4)
Eos: 3 %
Hematocrit: 41.1 % (ref 37.5–51.0)
Hemoglobin: 13.6 g/dL (ref 13.0–17.7)
Immature Grans (Abs): 0 10*3/uL (ref 0.0–0.1)
Immature Granulocytes: 0 %
Lymphocytes Absolute: 2.1 10*3/uL (ref 0.7–3.1)
Lymphs: 21 %
MCH: 29.7 pg (ref 26.6–33.0)
MCHC: 33.1 g/dL (ref 31.5–35.7)
MCV: 90 fL (ref 79–97)
Monocytes Absolute: 0.7 10*3/uL (ref 0.1–0.9)
Monocytes: 7 %
Neutrophils Absolute: 6.7 10*3/uL (ref 1.4–7.0)
Neutrophils: 68 %
Platelets: 422 10*3/uL (ref 150–450)
RBC: 4.58 x10E6/uL (ref 4.14–5.80)
RDW: 14.1 % (ref 11.6–15.4)
WBC: 9.8 10*3/uL (ref 3.4–10.8)

## 2022-11-17 LAB — COMPREHENSIVE METABOLIC PANEL
ALT: 17 IU/L (ref 0–44)
AST: 19 IU/L (ref 0–40)
Albumin: 3.9 g/dL (ref 3.8–4.9)
Alkaline Phosphatase: 160 IU/L — ABNORMAL HIGH (ref 44–121)
BUN/Creatinine Ratio: 10 (ref 9–20)
BUN: 10 mg/dL (ref 6–24)
Bilirubin Total: 0.2 mg/dL (ref 0.0–1.2)
CO2: 25 mmol/L (ref 20–29)
Calcium: 9.4 mg/dL (ref 8.7–10.2)
Chloride: 102 mmol/L (ref 96–106)
Creatinine, Ser: 0.97 mg/dL (ref 0.76–1.27)
Globulin, Total: 3.2 g/dL (ref 1.5–4.5)
Glucose: 82 mg/dL (ref 70–99)
Potassium: 4.1 mmol/L (ref 3.5–5.2)
Sodium: 144 mmol/L (ref 134–144)
Total Protein: 7.1 g/dL (ref 6.0–8.5)
eGFR: 93 mL/min/{1.73_m2} (ref >59)

## 2022-11-17 LAB — LIPID PANEL
Chol/HDL Ratio: 3.4 ratio (ref 0.0–5.0)
Cholesterol, Total: 151 mg/dL (ref 100–199)
HDL: 44 mg/dL (ref >39)
LDL Chol Calc (NIH): 92 mg/dL (ref 0–99)
Triglycerides: 77 mg/dL (ref 0–149)
VLDL Cholesterol Cal: 15 mg/dL (ref 5–40)

## 2022-11-17 LAB — HEMOGLOBIN A1C
Est. average glucose Bld gHb Est-mCnc: 100 mg/dL
Hgb A1c MFr Bld: 5.1 % (ref 4.8–5.6)

## 2022-11-17 LAB — TSH RFX ON ABNORMAL TO FREE T4: TSH: 1.15 u[IU]/mL (ref 0.450–4.500)

## 2022-11-23 ENCOUNTER — Ambulatory Visit (INDEPENDENT_AMBULATORY_CARE_PROVIDER_SITE_OTHER): Payer: BC Managed Care – PPO | Admitting: Family Medicine

## 2022-11-23 ENCOUNTER — Encounter (HOSPITAL_BASED_OUTPATIENT_CLINIC_OR_DEPARTMENT_OTHER): Payer: Self-pay | Admitting: Family Medicine

## 2022-11-23 VITALS — BP 179/101 | HR 75 | Ht 70.0 in | Wt 170.0 lb

## 2022-11-23 DIAGNOSIS — Z23 Encounter for immunization: Secondary | ICD-10-CM | POA: Diagnosis not present

## 2022-11-23 DIAGNOSIS — Z0000 Encounter for general adult medical examination without abnormal findings: Secondary | ICD-10-CM

## 2022-11-23 NOTE — Assessment & Plan Note (Signed)
Routine HCM labs reviewed. HCM reviewed/discussed. Anticipatory guidance regarding healthy weight, lifestyle and choices given. Recommend healthy diet.  Recommend approximately 150 minutes/week of moderate intensity exercise Recommend regular dental and vision exams Always use seatbelt/lap and shoulder restraints Recommend using smoke alarms and checking batteries at least twice a year Recommend using sunscreen when outside Discussed colon cancer screening recommendations, options.  Patient reports being scheduled in September Discussed recommendations for shingles vaccine.  Patient will consider Discussed tetanus immunization recommendations, patient agreed to proceed with this today Recommend seasonal influenza vaccine, patient declines

## 2022-11-23 NOTE — Progress Notes (Signed)
Subjective:    CC: Annual Physical Exam  HPI:  Mark [name] is a 52 y.o. presenting for annual physical  I reviewed the past medical history, family history, social history, surgical history, and allergies today and no changes were needed.  Please see the problem list section below in epic for further details.  Past Medical History: Past Medical History:  Diagnosis Date   Arthritis    RA   Hip fracture (HCC)    Hypertension    Rheumatoid arthritis (HCC)    Past Surgical History: Past Surgical History:  Procedure Laterality Date   HYDRADENITIS EXCISION Bilateral 09/18/2018   Procedure: EXCISION BILATERAL AXILLARY HIDRADENITIS;  Surgeon: Harriette Bouillon, MD;  Location: [facility];  Service: General;  Laterality: Bilateral;   INCISION AND DRAINAGE PERIRECTAL ABSCESS N/A 07/17/2018   Procedure: IRRIGATION AND DEBRIDEMENT PERIRECTAL ABSCESS;  Surgeon: Axel Filler, MD;  Location: [facility] OR;  Service: General;  Laterality: N/A;   Social History: Social History   Socioeconomic History   Marital status: Divorced    Spouse name: Not on file   Number of children: Not on file   Years of education: Not on file   Highest education level: Not on file  Occupational History   Not on file  Tobacco Use   Smoking status: Never   Smokeless tobacco: Never  Vaping Use   Vaping status: Never Used  Substance and Sexual Activity   Alcohol use: Not Currently   Drug use: Not Currently    Frequency: 3.0 times per week   Sexual activity: Not on file  Other Topics Concern   Not on file  Social History Narrative   Not on file   Social Determinants of Health   Financial Resource Strain: Not on file  Food Insecurity: Food Insecurity Present (06/10/2020)   Received from [facility], [facility]   Hunger Vital Sign    Worried About Running Out of Food in the Last Year: Sometimes true    Ran Out of Food in the Last Year: Sometimes true  Transportation Needs: Not on file   Physical Activity: Not on file  Stress: Not on file  Social Connections: Unknown (08/15/2021)   Received from [facility], [facility]   Social Network    Social Network: Not on file   Family History: Family History  Problem Relation Age of Onset   Heart failure Mother    Aneurysm Father    Allergies: Allergies  Allergen Reactions   Peanut-Containing Drug Products Itching and Rash   Medications: See med rec.  Review of Systems: No headache, visual changes, nausea, vomiting, diarrhea, constipation, dizziness, abdominal pain, skin rash, fevers, chills, night sweats, swollen lymph nodes, weight loss, chest pain, body aches, joint swelling, muscle aches, shortness of breath, mood changes, visual or auditory hallucinations.  Objective:    BP (!) 179/101 Comment: Repeat BP  Pulse 75   Ht 5\' 10"  (1.778 m)   Wt 170 lb (77.1 kg)   SpO2 100%   BMI 24.39 kg/m   General: Well Developed, well nourished, and in no acute distress. Neuro: Alert and oriented x3, extra-ocular muscles intact, sensation grossly intact. Cranial nerves II through XII are intact, motor, sensory, and coordinative functions are all intact. HEENT: Normocephalic, atraumatic, pupils equal round reactive to light, neck supple, no masses, no lymphadenopathy, thyroid nonpalpable. Oropharynx, nasopharynx, external ear canals are unremarkable. Skin: Warm and dry, no rashes noted. Cardiac: Regular rate and rhythm, no murmurs rubs or gallops. Respiratory: Clear to  auscultation bilaterally. Not using accessory muscles, speaking in full sentences. Abdominal: Soft, nontender, nondistended, positive bowel sounds, no masses, no organomegaly. Musculoskeletal: Shoulder, elbow, wrist, hip, knee, ankle stable, and with full range of motion.  Impression and Recommendations:    Need for tetanus booster -     Td vaccine greater than or equal to 7yo preservative free IM  Wellness examination Assessment & Plan: Routine HCM labs  reviewed. HCM reviewed/discussed. Anticipatory guidance regarding healthy weight, lifestyle and choices given. Recommend healthy diet.  Recommend approximately 150 minutes/week of moderate intensity exercise Recommend regular dental and vision exams Always use seatbelt/lap and shoulder restraints Recommend using smoke alarms and checking batteries at least twice a year Recommend using sunscreen when outside Discussed colon cancer screening recommendations, options.  Patient reports being scheduled in September Discussed recommendations for shingles vaccine.  Patient will consider Discussed tetanus immunization recommendations, patient agreed to proceed with this today Recommend seasonal influenza vaccine, patient declines   Did not take BP meds this morning. Not checking BP at home. Elevated in office today. Recommend close follow-up in 1-2 months for BP. Recommend taking medications every day, including office visit days.  Return in about 6 weeks (around 01/04/2023) for hypertension.   ___________________________________________ Mark Nilson de [place], MD, ABFM, CAQSM [facility]

## 2022-12-08 ENCOUNTER — Telehealth: Payer: Self-pay | Admitting: *Deleted

## 2022-12-08 NOTE — Telephone Encounter (Signed)
Pt did not show for in pt PV. LM with call back # and instructions to call by end of the day and reschedule the PV with a nurse or procedure will be canceled. No shwo letters to be sent to my chart and by mail.

## 2022-12-08 NOTE — Progress Notes (Deleted)
Pt's name and DOB verified at the beginning of the pre-visit.  Pt denies any difficulty with ambulating,sitting, laying down or rolling side to side Gave both [facility] main # and MD on call # prior to instructions.  No egg or soy allergy known to patient  No issues known to pt with past sedation with any surgeries or procedures Pt denies having issues being intubated Patient denies ever being intubated Pt has no issues moving head neck or swallowing No FH of Malignant Hyperthermia Pt is not on diet pills Pt is not on home 02  Pt is not on blood thinners  Pt denies issues with constipation  Pt has frequent issues with constipation RN instructed pt to use Miralax per bottles instructions a week before prep days. Pt states they will Pt is not on dialysis Pt denise any abnormal heart rhythms  Pt denies any upcoming cardiac testing Pt encouraged to use to use Singlecare or Goodrx to reduce cost  Patient's chart reviewed by John Nulty CNRA prior to pre-visit and patient appropriate for the [facility].  Pre-visit completed and red dot placed by patient's name on their procedure day (on provider's schedule).  . Visit by phone Pt states weight is  Instructed pt why it is important to and  to call if they have any changes in health or new medications. Directed them to the # given and on instructions.   Pt states they will.  Instructions reviewed with pt and pt states understanding. Instructed to review again prior to procedure. Pt states they will.  Instructions sent by mail with coupon and by my chart   

## 2022-12-27 ENCOUNTER — Encounter: Payer: BC Managed Care – PPO | Admitting: Gastroenterology

## 2022-12-28 ENCOUNTER — Other Ambulatory Visit (HOSPITAL_BASED_OUTPATIENT_CLINIC_OR_DEPARTMENT_OTHER): Payer: Self-pay

## 2022-12-28 ENCOUNTER — Encounter: Payer: Self-pay | Admitting: Gastroenterology

## 2022-12-28 ENCOUNTER — Telehealth: Payer: Self-pay | Admitting: *Deleted

## 2022-12-28 ENCOUNTER — Ambulatory Visit: Payer: BC Managed Care – PPO | Admitting: *Deleted

## 2022-12-28 VITALS — Ht 70.0 in | Wt 166.0 lb

## 2022-12-28 DIAGNOSIS — Z1211 Encounter for screening for malignant neoplasm of colon: Secondary | ICD-10-CM

## 2022-12-28 MED ORDER — NA SULFATE-K SULFATE-MG SULF 17.5-3.13-1.6 GM/177ML PO SOLN
1.0000 | Freq: Once | ORAL | 0 refills | Status: AC
  Filled 2022-12-28: qty 354, 1d supply, fill #0

## 2022-12-28 NOTE — Telephone Encounter (Signed)
Attempt to reach pt to request PV be done on the phone. LM with call back #

## 2022-12-28 NOTE — Progress Notes (Signed)
Pt's name and DOB verified at the beginning of the pre-visit.  Pt denies any difficulty with ambulating,sitting, laying down or rolling side to side Gave both [facility] main # and MD on call # prior to instructions.  No egg or soy allergy known to patient  No issues known to pt with past sedation with any surgeries or procedures Pt denies having issues being intubated Pt has no issues moving head neck or swallowing No FH of Malignant Hyperthermia Pt is not on diet pills Pt is not on home 02  Pt is not on blood thinners  Pt denies issues with constipation  Pt is not on dialysis Pt denise any abnormal heart rhythms  Pt denies any upcoming cardiac testing Pt encouraged to use to use Singlecare or Goodrx to reduce cost  Patient's chart reviewed by Cathlyn Parsons CNRA prior to pre-visit and patient appropriate for the [facility].  Pre-visit completed and red dot placed by patient's name on their procedure day (on provider's schedule).  . Visit bin person Pt scale weight is 166 lb Instructed pt why it is important to and  to call if they have any changes in health or new medications. Directed them to the # given and on instructions.   Pt states they will.  Instructions reviewed with pt and pt states understanding. Instructed to review again prior to procedure. Pt states they will.  Instructions given to pt with coupon and by my chart

## 2023-01-04 ENCOUNTER — Ambulatory Visit (HOSPITAL_BASED_OUTPATIENT_CLINIC_OR_DEPARTMENT_OTHER): Payer: BC Managed Care – PPO | Admitting: Family Medicine

## 2023-01-07 ENCOUNTER — Encounter: Payer: Self-pay | Admitting: Certified Registered Nurse Anesthetist

## 2023-01-08 NOTE — Telephone Encounter (Signed)
Pt states he was sick last week and wanted to check if he is OK to do procedure on 01/10/23. Pt states now he has a cough, no fever and  light amount of congestion. Instructed pt to call and cancel if he wakes the day of procedure and has a fever and if he still has a cough to wear a mask. Pt states he will. No other questions at this time.Marland Kitchen

## 2023-01-08 NOTE — Telephone Encounter (Signed)
Inbound call from patient states he recently had flu like symptoms last Monday. Patient states he didi not go to the hospital to verify if he had the flu.   Current symptoms are a cough and congestion. Patient also states he is feeling and doing fine.   Please advise.

## 2023-01-10 ENCOUNTER — Encounter: Payer: Self-pay | Admitting: Gastroenterology

## 2023-01-10 ENCOUNTER — Ambulatory Visit (AMBULATORY_SURGERY_CENTER): Payer: BC Managed Care – PPO | Admitting: Gastroenterology

## 2023-01-10 VITALS — BP 216/108 | HR 72 | Temp 98.4°F | Resp 14 | Ht 70.0 in | Wt 166.0 lb

## 2023-01-10 DIAGNOSIS — Z1211 Encounter for screening for malignant neoplasm of colon: Secondary | ICD-10-CM

## 2023-01-10 DIAGNOSIS — K641 Second degree hemorrhoids: Secondary | ICD-10-CM

## 2023-01-10 MED ORDER — SODIUM CHLORIDE 0.9 % IV SOLN: 500.0000 mL | INTRAVENOUS | Status: DC

## 2023-01-10 NOTE — Progress Notes (Signed)
Report given to PACU, vss 

## 2023-01-10 NOTE — Progress Notes (Signed)
Pt's states no medical or surgical changes since previsit or office visit. 

## 2023-01-10 NOTE — Patient Instructions (Signed)
Please read handouts provided. Continue present medications. Repeat colonoscopy in 10 years for screening. Return to GI clinic as needed. Consider a fiber supplement.   YOU HAD AN ENDOSCOPIC PROCEDURE TODAY AT THE [facility]:   Refer to the procedure report that was given to you for any specific questions about what was found during the examination.  If the procedure report does not answer your questions, please call your gastroenterologist to clarify.  If you requested that your care partner not be given the details of your procedure findings, then the procedure report has been included in a sealed envelope for you to review at your convenience later.  YOU SHOULD EXPECT: Some feelings of bloating in the abdomen. Passage of more gas than usual.  Walking can help get rid of the air that was put into your GI tract during the procedure and reduce the bloating. If you had a lower endoscopy (such as a colonoscopy or flexible sigmoidoscopy) you may notice spotting of blood in your stool or on the toilet paper. If you underwent a bowel prep for your procedure, you may not have a normal bowel movement for a few days.  Please Note:  You might notice some irritation and congestion in your nose or some drainage.  This is from the oxygen used during your procedure.  There is no need for concern and it should clear up in a day or so.  SYMPTOMS TO REPORT IMMEDIATELY:  Following lower endoscopy (colonoscopy or flexible sigmoidoscopy):  Excessive amounts of blood in the stool  Significant tenderness or worsening of abdominal pains  Swelling of the abdomen that is new, acute  Fever of 100F or higher.  For urgent or emergent issues, a gastroenterologist can be reached at any hour by calling [phone]. Do not use MyChart messaging for urgent concerns.    DIET:  We do recommend a small meal at first, but then you may proceed to your regular diet.  Drink plenty of fluids but you should avoid  alcoholic beverages for 24 hours.  ACTIVITY:  You should plan to take it easy for the rest of today and you should NOT DRIVE or use heavy machinery until tomorrow (because of the sedation medicines used during the test).    FOLLOW UP: Our staff will call the number listed on your records the next business day following your procedure.  We will call around 7:15- 8:00 am to check on you and address any questions or concerns that you may have regarding the information given to you following your procedure. If we do not reach you, we will leave a message.     If any biopsies were taken you will be contacted by phone or by letter within the next 1-3 weeks.  Please call us at [phone] if you have not heard about the biopsies in 3 weeks.    SIGNATURES/CONFIDENTIALITY: You and/or your care partner have signed paperwork which will be entered into your electronic medical record.  These signatures attest to the fact that that the information above on your After Visit Summary has been reviewed and is understood.  Full responsibility of the confidentiality of this discharge information lies with you and/or your care-partner.

## 2023-01-10 NOTE — Progress Notes (Signed)
GASTROENTEROLOGY PROCEDURE H&P NOTE   Primary Care Physician: de Peru, Buren Kos, MD    Reason for Procedure:  Colon Cancer screening  Plan:    Colonoscopy  Patient is appropriate for endoscopic procedure(s) in the ambulatory ([facility]) setting.  The nature of the procedure, as well as the risks, benefits, and alternatives were carefully and thoroughly reviewed with the patient. Ample time for discussion and questions allowed. The patient understood, was satisfied, and agreed to proceed.     HPI: Mark [name] is a 52 y.o. male who presents for colonoscopy for routine Colon Cancer screening.  No active GI symptoms.  No known family history of colon cancer or related malignancy.  Patient is otherwise without complaints or active issues today.  Past Medical History:  Diagnosis Date   Arthritis    RA   Hip fracture (HCC)    Hypertension    Rheumatoid arthritis (HCC)     Past Surgical History:  Procedure Laterality Date   HYDRADENITIS EXCISION Bilateral 09/18/2018   Procedure: EXCISION BILATERAL AXILLARY HIDRADENITIS;  Surgeon: Harriette Bouillon, MD;  Location: [facility];  Service: General;  Laterality: Bilateral;   INCISION AND DRAINAGE PERIRECTAL ABSCESS N/A 07/17/2018   Procedure: IRRIGATION AND DEBRIDEMENT PERIRECTAL ABSCESS;  Surgeon: Axel Filler, MD;  Location: [facility] OR;  Service: General;  Laterality: N/A;    Prior to Admission medications   Medication Sig Start Date End Date Taking? Authorizing Provider  acetaminophen (TYLENOL) 325 MG tablet Take 650 mg by mouth every 6 (six) hours as needed for mild pain or moderate pain. Patient not taking: Reported on 12/28/2022    [provider]  amLODipine (NORVASC) 5 MG tablet Take 1 tablet (5 mg total) by mouth daily. 10/26/22   de [place], Raymond J, MD  ENBREL SURECLICK 50 MG/ML injection Inject 50 mg into the skin every Saturday.  06/15/18   [provider]  prednisoLONE acetate (PRED FORTE) 1 %  ophthalmic suspension Place 1 drop into the right eye 4 (four) times daily. Patient not taking: Reported on 12/28/2022 11/08/22   [provider]  valsartan (DIOVAN) 80 MG tablet Take 1 tablet (80 mg total) by mouth daily. 10/26/22   de [place], Raymond J, MD    Current Outpatient Medications  Medication Sig Dispense Refill   acetaminophen (TYLENOL) 325 MG tablet Take 650 mg by mouth every 6 (six) hours as needed for mild pain or moderate pain. (Patient not taking: Reported on 12/28/2022)     amLODipine (NORVASC) 5 MG tablet Take 1 tablet (5 mg total) by mouth daily. 90 tablet 1   ENBREL SURECLICK 50 MG/ML injection Inject 50 mg into the skin every Saturday.      prednisoLONE acetate (PRED FORTE) 1 % ophthalmic suspension Place 1 drop into the right eye 4 (four) times daily. (Patient not taking: Reported on 12/28/2022)     valsartan (DIOVAN) 80 MG tablet Take 1 tablet (80 mg total) by mouth daily. 90 tablet 1   Current Facility-Administered Medications  Medication Dose Route Frequency Provider Last Rate Last Admin   0.9 %  sodium chloride infusion  500 mL Intravenous Continuous Elijah Phommachanh V, DO        Allergies as of 01/10/2023 - Review Complete 01/10/2023  Allergen Reaction Noted   Peanut-containing drug products Itching and Rash 07/17/2018    Family History  Problem Relation Age of Onset   Heart failure Mother    Aneurysm Father    Colon polyps Neg Hx  Colon cancer Neg Hx    Esophageal cancer Neg Hx    Stomach cancer Neg Hx    Rectal cancer Neg Hx     Social History   Socioeconomic History   Marital status: Divorced    Spouse name: Not on file   Number of children: Not on file   Years of education: Not on file   Highest education level: Not on file  Occupational History   Not on file  Tobacco Use   Smoking status: Some Days    Types: Cigars   Smokeless tobacco: Never  Vaping Use   Vaping status: Never Used  Substance and Sexual Activity   Alcohol use: Not  Currently   Drug use: Not Currently    Frequency: 3.0 times per week   Sexual activity: Not on file  Other Topics Concern   Not on file  Social History Narrative   Not on file   Social Determinants of Health   Financial Resource Strain: Not on file  Food Insecurity: Food Insecurity Present (06/10/2020)   Received from [facility], [facility]   Hunger Vital Sign    Worried About Running Out of Food in the Last Year: Sometimes true    Ran Out of Food in the Last Year: Sometimes true  Transportation Needs: Not on file  Physical Activity: Not on file  Stress: Not on file  Social Connections: Unknown (08/15/2021)   Received from [facility], [facility]   Social Network    Social Network: Not on file  Intimate Partner Violence: Unknown (07/07/2021)   Received from [facility], [facility]   HITS    Physically Hurt: Not on file    Insult or Talk Down To: Not on file    Threaten Physical Harm: Not on file    Scream or Curse: Not on file    Physical Exam: Vital signs in last 24 hours: @BP  (!) 189/103   Pulse 68   Temp 98.4 F (36.9 C)   Ht 5\' 10"  (1.778 m)   Wt 166 lb (75.3 kg)   SpO2 98%   BMI 23.82 kg/m  GEN: NAD EYE: Sclerae anicteric ENT: MMM CV: Non-tachycardic Pulm: CTA b/l GI: Soft, NT/ND NEURO:  Alert & Oriented x 3   Doristine Locks, DO [facility]   01/10/2023 12:53 PM

## 2023-01-10 NOTE — Op Note (Signed)
[facility] Patient Name: Mark [name] Procedure Date: 01/10/2023 1:15 PM MRN: [id number] Endoscopist: Doristine Locks , MD, [id number] Age: 52 Referring MD:  Date of Birth: [birth year] Gender: Male Account #: [id number] Procedure:                Colonoscopy Indications:              Screening for colorectal malignant neoplasm, This                            is the patient's first colonoscopy Medicines:                Monitored Anesthesia Care Procedure:                Pre-Anesthesia Assessment:                           - Prior to the procedure, a History and Physical                            was performed, and patient medications and                            allergies were reviewed. The patient's tolerance of                            previous anesthesia was also reviewed. The risks                            and benefits of the procedure and the sedation                            options and risks were discussed with the patient.                            All questions were answered, and informed consent                            was obtained. Prior Anticoagulants: The patient has                            taken no anticoagulant or antiplatelet agents. ASA                            Grade Assessment: II - A patient with mild systemic                            disease. After reviewing the risks and benefits,                            the patient was deemed in satisfactory condition to                            undergo the procedure.  After obtaining informed consent, the colonoscope                            was passed under direct vision. Throughout the                            procedure, the patient's blood pressure, pulse, and                            oxygen saturations were monitored continuously. The                            CF [id number] #[id number] was introduced through the anus                            and advanced to the  the cecum, identified by                            appendiceal orifice and ileocecal valve. The                            colonoscopy was performed without difficulty. The                            patient tolerated the procedure well. The quality                            of the bowel preparation was good. The ileocecal                            valve, appendiceal orifice, and rectum were                            photographed. Scope In: 1:19:29 PM Scope Out: 1:31:10 PM Scope Withdrawal Time: 0 hours 9 minutes 22 seconds  Total Procedure Duration: 0 hours 11 minutes 41 seconds  Findings:                 The perianal and digital rectal examinations were                            normal.                           The entire colon appeared normal.                           Non-bleeding internal hemorrhoids were found during                            retroflexion. The hemorrhoids were small. Complications:            No immediate complications. Estimated Blood Loss:     Estimated blood loss: none. Impression:               - The entire examined colon is normal.                           -  Non-bleeding internal hemorrhoids.                           - No specimens collected. Recommendation:           - Patient has a contact number available for                            emergencies. The signs and symptoms of potential                            delayed complications were discussed with the                            patient. Return to normal activities tomorrow.                            Written discharge instructions were provided to the                            patient.                           - Resume previous diet.                           - Continue present medications.                           - Repeat colonoscopy in 10 years for screening                            purposes.                           - Return to GI clinic PRN.                           - Use fiber,  for example Citrucel, Fibercon, Konsyl                            or Metamucil. Doristine Locks, MD 01/10/2023 1:35:40 PM

## 2023-01-11 ENCOUNTER — Telehealth: Payer: Self-pay

## 2023-01-11 NOTE — Telephone Encounter (Signed)
  Follow up Call-     01/10/2023   12:31 PM  Call back number  Post procedure Call Back phone  # [phone]  Permission to leave phone message Yes     Unable to leave a message on the patients phone.

## 2023-03-22 ENCOUNTER — Ambulatory Visit: Payer: BC Managed Care – PPO | Admitting: Gastroenterology

## 2023-03-23 ENCOUNTER — Other Ambulatory Visit: Payer: Self-pay

## 2023-03-23 ENCOUNTER — Emergency Department (HOSPITAL_BASED_OUTPATIENT_CLINIC_OR_DEPARTMENT_OTHER)
Admission: EM | Admit: 2023-03-23 | Discharge: 2023-03-23 | Disposition: A | Discharge status: Home / Self Care | Payer: BC Managed Care – PPO | Attending: Emergency Medicine | Admitting: Emergency Medicine

## 2023-03-23 ENCOUNTER — Encounter (HOSPITAL_BASED_OUTPATIENT_CLINIC_OR_DEPARTMENT_OTHER): Payer: Self-pay

## 2023-03-23 ENCOUNTER — Other Ambulatory Visit (HOSPITAL_BASED_OUTPATIENT_CLINIC_OR_DEPARTMENT_OTHER): Payer: Self-pay

## 2023-03-23 ENCOUNTER — Emergency Department (HOSPITAL_BASED_OUTPATIENT_CLINIC_OR_DEPARTMENT_OTHER): Payer: BC Managed Care – PPO

## 2023-03-23 DIAGNOSIS — Z79899 Other long term (current) drug therapy: Secondary | ICD-10-CM | POA: Diagnosis not present

## 2023-03-23 DIAGNOSIS — Z91010 Allergy to peanuts: Secondary | ICD-10-CM | POA: Insufficient documentation

## 2023-03-23 DIAGNOSIS — K523 Indeterminate colitis: Secondary | ICD-10-CM | POA: Diagnosis not present

## 2023-03-23 DIAGNOSIS — R1031 Right lower quadrant pain: Secondary | ICD-10-CM | POA: Diagnosis present

## 2023-03-23 DIAGNOSIS — K529 Noninfective gastroenteritis and colitis, unspecified: Secondary | ICD-10-CM

## 2023-03-23 LAB — COMPREHENSIVE METABOLIC PANEL
ALT: 9 U/L (ref 0–44)
AST: 15 U/L (ref 15–41)
Albumin: 4.1 g/dL (ref 3.5–5.0)
Alkaline Phosphatase: 120 U/L (ref 38–126)
Anion gap: 11 (ref 5–15)
BUN: 16 mg/dL (ref 6–20)
CO2: 24 mmol/L (ref 22–32)
Calcium: 9.5 mg/dL (ref 8.9–10.3)
Chloride: 103 mmol/L (ref 98–111)
Creatinine, Ser: 0.93 mg/dL (ref 0.61–1.24)
GFR, Estimated: >60 mL/min (ref >60)
Glucose, Bld: 91 mg/dL (ref 70–99)
Potassium: 3.7 mmol/L (ref 3.5–5.1)
Sodium: 138 mmol/L (ref 135–145)
Total Bilirubin: 0.5 mg/dL (ref <1.2)
Total Protein: 8 g/dL (ref 6.5–8.1)

## 2023-03-23 LAB — CBC
HCT: 38.9 % — ABNORMAL LOW (ref 39.0–52.0)
Hemoglobin: 12.7 g/dL — ABNORMAL LOW (ref 13.0–17.0)
MCH: 29.1 pg (ref 26.0–34.0)
MCHC: 32.6 g/dL (ref 30.0–36.0)
MCV: 89 fL (ref 80.0–100.0)
Platelets: 423 10*3/uL — ABNORMAL HIGH (ref 150–400)
RBC: 4.37 MIL/uL (ref 4.22–5.81)
RDW: 14.9 % (ref 11.5–15.5)
WBC: 9.5 10*3/uL (ref 4.0–10.5)
nRBC: 0 % (ref 0.0–0.2)

## 2023-03-23 LAB — URINALYSIS, ROUTINE W REFLEX MICROSCOPIC
Bacteria, UA: NONE SEEN
Bilirubin Urine: NEGATIVE
Glucose, UA: NEGATIVE mg/dL
Ketones, ur: 15 mg/dL — AB
Leukocytes,Ua: NEGATIVE
Nitrite: NEGATIVE
Specific Gravity, Urine: 1.023 (ref 1.005–1.030)
pH: 6 (ref 5.0–8.0)

## 2023-03-23 LAB — LIPASE, BLOOD: Lipase: 22 U/L (ref 11–51)

## 2023-03-23 MED ORDER — HYDROMORPHONE HCL 1 MG/ML IJ SOLN
1.0000 mg | Freq: Once | INTRAMUSCULAR | Status: AC
  Administered 2023-03-23: 1 mg via INTRAVENOUS
  Filled 2023-03-23: qty 1

## 2023-03-23 MED ORDER — ONDANSETRON HCL 4 MG/2ML IJ SOLN
4.0000 mg | Freq: Once | INTRAMUSCULAR | Status: AC
  Administered 2023-03-23: 4 mg via INTRAVENOUS
  Filled 2023-03-23: qty 2

## 2023-03-23 MED ORDER — OXYCODONE-ACETAMINOPHEN 5-325 MG PO TABS
2.0000 | ORAL_TABLET | ORAL | 0 refills | Status: DC | PRN
  Filled 2023-03-23: qty 20, 2d supply, fill #0

## 2023-03-23 MED ORDER — METRONIDAZOLE 500 MG PO TABS
500.0000 mg | ORAL_TABLET | Freq: Once | ORAL | Status: AC
  Administered 2023-03-23: 500 mg via ORAL
  Filled 2023-03-23: qty 1

## 2023-03-23 MED ORDER — CIPROFLOXACIN HCL 500 MG PO TABS
500.0000 mg | ORAL_TABLET | Freq: Two times a day (BID) | ORAL | 0 refills | Status: DC
  Filled 2023-03-23: qty 14, 7d supply, fill #0

## 2023-03-23 MED ORDER — DOCUSATE SODIUM 100 MG PO CAPS
100.0000 mg | ORAL_CAPSULE | Freq: Once | ORAL | Status: AC
  Administered 2023-03-23: 100 mg via ORAL
  Filled 2023-03-23: qty 1

## 2023-03-23 MED ORDER — IOHEXOL 300 MG/ML  SOLN
100.0000 mL | Freq: Once | INTRAMUSCULAR | Status: AC | PRN
  Administered 2023-03-23: 85 mL via INTRAVENOUS

## 2023-03-23 MED ORDER — CIPROFLOXACIN HCL 500 MG PO TABS
500.0000 mg | ORAL_TABLET | Freq: Once | ORAL | Status: AC
  Administered 2023-03-23: 500 mg via ORAL
  Filled 2023-03-23: qty 1

## 2023-03-23 MED ORDER — METRONIDAZOLE 500 MG PO TABS
500.0000 mg | ORAL_TABLET | Freq: Two times a day (BID) | ORAL | 0 refills | Status: DC
  Filled 2023-03-23: qty 14, 7d supply, fill #0

## 2023-03-23 MED ORDER — DICYCLOMINE HCL 20 MG PO TABS
20.0000 mg | ORAL_TABLET | Freq: Two times a day (BID) | ORAL | 0 refills | Status: DC | PRN
  Filled 2023-03-23: qty 20, 10d supply, fill #0

## 2023-03-23 MED ORDER — OXYCODONE-ACETAMINOPHEN 5-325 MG PO TABS: 2.0000 | ORAL_TABLET | Freq: Once | ORAL | Status: DC

## 2023-03-23 MED ORDER — DOCUSATE SODIUM 100 MG PO CAPS
100.0000 mg | ORAL_CAPSULE | Freq: Two times a day (BID) | ORAL | 0 refills | Status: DC
  Filled 2023-03-23: qty 60, 30d supply, fill #0

## 2023-03-23 MED ORDER — DICYCLOMINE HCL 10 MG PO CAPS
10.0000 mg | ORAL_CAPSULE | Freq: Once | ORAL | Status: AC
  Administered 2023-03-23: 10 mg via ORAL
  Filled 2023-03-23: qty 1

## 2023-03-23 NOTE — ED Triage Notes (Signed)
Pt had colonoscopy a month ago.Since then he has had poor PO intake time of Bms has changed. LBM at 0200 this morning. C/o RLQ pain and he is possibly constipated. Denies blood in stool. Pt with emesis x 1 today. Pt has urinary urgency and frequency.

## 2023-03-23 NOTE — ED Notes (Addendum)
Discharge paperwork given and verbally understood.... Pt understood no drinking/driving due to the meds that were given... Pt stated that his mother was on the way to transport him home... Pt was CAOx4.Marland KitchenMarland Kitchen

## 2023-03-24 NOTE — ED Provider Notes (Signed)
[place] EMERGENCY DEPARTMENT AT [facility] Provider Note   CSN: [id number] Arrival date & time: 03/23/23  1027     History  Chief Complaint  Patient presents with   Abdominal Pain    Mark [name] is a 52 y.o. male.  52 year old male with ongoing right lower quadrant abdominal pain.  Patient's had episodes like this quite a few times in the past.  He actually had a colonoscopy about a month ago which was reportedly normal.  He states that since then has had worsening right lower quadrant abdominal pain and then also has had decreased p.o. intake secondary to it.  Has had some looser stools but no diarrhea.  No constipation.  No blood in his stools.  He did throw up today 1 time with no blood in it.  Increased urination.  On review of previous CT scans and record multiple times he has had concern for Crohn's disease related to his distal ileum.  On review of GI notes there is no mention of this concern for any biopsies at vitamin taken or other workup for it.  This may be because he has a known history of rheumatoid arthritis and is already on immune modulators for it?   Abdominal Pain      Home Medications Prior to Admission medications   Medication Sig Start Date End Date Taking? Authorizing Provider  ciprofloxacin (CIPRO) 500 MG tablet Take 1 tablet (500 mg total) by mouth 2 (two) times daily. 03/23/23  Yes Gracen Ringwald, Barbara Cower, MD  dicyclomine (BENTYL) 20 MG tablet Take 1 tablet (20 mg total) by mouth 2 (two) times daily as needed for spasms (abdominal cramping). 03/23/23  Yes Riggs Dineen, Barbara Cower, MD  docusate sodium (COLACE) 100 MG capsule Take 1 capsule (100 mg total) by mouth every 12 (twelve) hours. 03/23/23  Yes Nazar Kuan, Barbara Cower, MD  metroNIDAZOLE (FLAGYL) 500 MG tablet Take 1 tablet (500 mg total) by mouth 2 (two) times daily. 03/23/23  Yes Keisean Skowron, Barbara Cower, MD  oxyCODONE-acetaminophen (PERCOCET) 5-325 MG tablet Take 2 tablets by mouth every 4 (four) hours as needed. 03/23/23   Yes Miasia Crabtree, Barbara Cower, MD  acetaminophen (TYLENOL) 325 MG tablet Take 650 mg by mouth every 6 (six) hours as needed for mild pain or moderate pain. Patient not taking: Reported on 12/28/2022    [provider]  amLODipine (NORVASC) 5 MG tablet Take 1 tablet (5 mg total) by mouth daily. 10/26/22   de [place], Raymond J, MD  ENBREL SURECLICK 50 MG/ML injection Inject 50 mg into the skin every Saturday.  06/15/18   [provider]  prednisoLONE acetate (PRED FORTE) 1 % ophthalmic suspension Place 1 drop into the right eye 4 (four) times daily. Patient not taking: Reported on 12/28/2022 11/08/22   [provider]  valsartan (DIOVAN) 80 MG tablet Take 1 tablet (80 mg total) by mouth daily. 10/26/22   de [place], Raymond J, MD      Allergies    Peanut-containing drug products    Review of Systems   Review of Systems  Gastrointestinal:  Positive for abdominal pain.    Physical Exam Updated Vital Signs BP (!) 164/106 (BP Location: Left Arm)   Pulse 64   Temp 97.8 F (36.6 C) (Oral)   Resp 16   Ht 5\' 10"  (1.778 m)   Wt 72.1 kg   SpO2 100%   BMI 22.81 kg/m  Physical Exam Vitals and nursing note reviewed.  Constitutional:      Appearance: He is well-developed.  HENT:  Head: Normocephalic and atraumatic.  Cardiovascular:     Rate and Rhythm: Normal rate.  Pulmonary:     Effort: Pulmonary effort is normal. No respiratory distress.  Abdominal:     General: There is no distension.     Tenderness: There is abdominal tenderness in the right lower quadrant.  Musculoskeletal:        General: Normal range of motion.     Cervical back: Normal range of motion.  Neurological:     Mental Status: He is alert.     ED Results / Procedures / Treatments   Labs (all labs ordered are listed, but only abnormal results are displayed) Labs Reviewed  CBC - Abnormal; Notable for the following components:      Result Value   Hemoglobin 12.7 (*)    HCT 38.9 (*)    Platelets 423 (*)     All other components within normal limits  URINALYSIS, ROUTINE W REFLEX MICROSCOPIC - Abnormal; Notable for the following components:   Hgb urine dipstick TRACE (*)    Ketones, ur 15 (*)    Protein, ur TRACE (*)    All other components within normal limits  LIPASE, BLOOD  COMPREHENSIVE METABOLIC PANEL    EKG None  Radiology CT ABDOMEN PELVIS W CONTRAST Result Date: 03/23/2023 CLINICAL DATA:  Acute, nonlocalized abdominal pain. Right lower quadrant pain. Possible constipation. EXAM: CT ABDOMEN AND PELVIS WITH CONTRAST TECHNIQUE: Multidetector CT imaging of the abdomen and pelvis was performed using the standard protocol following bolus administration of intravenous contrast. RADIATION DOSE REDUCTION: This exam was performed according to the departmental dose-optimization program which includes automated exposure control, adjustment of the mA and/or kV according to patient size and/or use of iterative reconstruction technique. CONTRAST:  85mL OMNIPAQUE IOHEXOL 300 MG/ML  SOLN COMPARISON:  11/02/2021 FINDINGS: Lower chest:  Mild atelectasis at the lung bases. Hepatobiliary: No focal liver abnormality.No evidence of biliary obstruction or stone. Pancreas: Unremarkable. Spleen: Unremarkable. Adrenals/Urinary Tract: Negative adrenals. No hydronephrosis or ureteral calculus. Punctate left upper pole calculus, see coronal reformats. Lobulated bilateral renal cortex attributed to scarring. Bilateral renal cystic densities, largest at the left interpolar kidney measuring 5.4 cm. Unremarkable bladder. Stomach/Bowel: Skip areas of bowel wall thickening including at the duodenum and proximal jejunum, terminal ileum, left lower quadrant small bowel, and likely at the left colon. Most pronounced swelling at the terminal ileum where there is indistinct enhancing soft tissue density at the ventral mesentery. Regional peritoneal thickening considered reactive peritonitis. No pneumoperitoneum or drainable  collection. Vascular/Lymphatic: Extensive atheromatous wall thickening of the aorta and iliacs for age. No acute vascular finding . Thickening of mesenteric lymph nodes considered reactive in this setting. Reproductive:No pathologic findings. Other: No ascites or pneumoperitoneum. Musculoskeletal: Sacroiliac joint irregularity again seen and consistent with chronic sacroiliitis. Regional sclerosis is presumably related and is stable. IMPRESSION: 1. Long-standing pattern of Crohn's with progression since 2023 at the terminal ileum where there is ventral penetrating disease but no drainable collection or pneumoperitoneum. 2. Premature aortic atherosclerosis. 3. Bilateral renal cortical scarring; punctate left renal calculus. 4. Chronic sacroiliitis. Electronically Signed   By: Tiburcio Pea M.D.   On: 03/23/2023 05:45    Procedures Procedures    Medications Ordered in ED Medications  ondansetron (ZOFRAN) injection 4 mg (4 mg Intravenous Given 03/23/23 0442)  dicyclomine (BENTYL) capsule 10 mg (10 mg Oral Given 03/23/23 0441)  iohexol (OMNIPAQUE) 300 MG/ML solution 100 mL (85 mLs Intravenous Contrast Given 03/23/23 0445)  HYDROmorphone (DILAUDID) injection 1 mg (  1 mg Intravenous Given 03/23/23 1610)  ciprofloxacin (CIPRO) tablet 500 mg (500 mg Oral Given 03/23/23 0658)  metroNIDAZOLE (FLAGYL) tablet 500 mg (500 mg Oral Given 03/23/23 0658)  docusate sodium (COLACE) capsule 100 mg (100 mg Oral Given 03/23/23 9604)    ED Course/ Medical Decision Making/ A&P                                 Medical Decision Making Amount and/or Complexity of Data Reviewed Labs: ordered. Radiology: ordered.  Risk OTC drugs. Prescription drug management.   CT scan here shows distal ileal inflammation consistent with possible inflammatory bowel disease.  He is already on Humira, will hold on steroids at this time.  Will treat with anti-inflammatories and pain medication with stool softeners.  I discussed that  he needed to follow-up with his GI doctor about this possibility if there is any need for further identification or maybe if it had been done in the past and I did not see it somewhere.  Either way patient is tolerating p.o., symptoms are improved here and no red flags requiring admission to the hospital or further workup in the hospital.  Stable for discharge with GI follow-up return here for any worsening symptoms such as fever, hematochezia, hematemesis, uncontrolled vomiting or any other concerns.   Final Clinical Impression(s) / ED Diagnoses Final diagnoses:  Inflammatory bowel disease    Rx / DC Orders ED Discharge Orders          Ordered    ciprofloxacin (CIPRO) 500 MG tablet  2 times daily        03/23/23 0656    dicyclomine (BENTYL) 20 MG tablet  2 times daily PRN        03/23/23 0656    metroNIDAZOLE (FLAGYL) 500 MG tablet  2 times daily        03/23/23 0656    oxyCODONE-acetaminophen (PERCOCET) 5-325 MG tablet  Every 4 hours PRN        03/23/23 0656    docusate sodium (COLACE) 100 MG capsule  Every 12 hours        03/23/23 0656              Everette Dimauro, Barbara Cower, MD 03/24/23 2141

## 2023-03-30 ENCOUNTER — Emergency Department (HOSPITAL_BASED_OUTPATIENT_CLINIC_OR_DEPARTMENT_OTHER): Payer: BC Managed Care – PPO

## 2023-03-30 ENCOUNTER — Inpatient Hospital Stay (HOSPITAL_BASED_OUTPATIENT_CLINIC_OR_DEPARTMENT_OTHER)
Admission: EM | Admit: 2023-03-30 | Discharge: 2023-04-02 | DRG: 386 | Disposition: A | Discharge status: Home / Self Care | Payer: BC Managed Care – PPO | Attending: Internal Medicine | Admitting: Internal Medicine

## 2023-03-30 ENCOUNTER — Encounter (HOSPITAL_BASED_OUTPATIENT_CLINIC_OR_DEPARTMENT_OTHER): Payer: Self-pay | Admitting: Emergency Medicine

## 2023-03-30 ENCOUNTER — Other Ambulatory Visit: Payer: Self-pay

## 2023-03-30 DIAGNOSIS — R933 Abnormal findings on diagnostic imaging of other parts of digestive tract: Secondary | ICD-10-CM

## 2023-03-30 DIAGNOSIS — D84821 Immunodeficiency due to drugs: Secondary | ICD-10-CM | POA: Diagnosis present

## 2023-03-30 DIAGNOSIS — K648 Other hemorrhoids: Secondary | ICD-10-CM | POA: Diagnosis present

## 2023-03-30 DIAGNOSIS — L732 Hidradenitis suppurativa: Secondary | ICD-10-CM | POA: Diagnosis present

## 2023-03-30 DIAGNOSIS — I700 Atherosclerosis of aorta: Secondary | ICD-10-CM | POA: Diagnosis present

## 2023-03-30 DIAGNOSIS — F17290 Nicotine dependence, other tobacco product, uncomplicated: Secondary | ICD-10-CM | POA: Diagnosis present

## 2023-03-30 DIAGNOSIS — D649 Anemia, unspecified: Secondary | ICD-10-CM | POA: Diagnosis present

## 2023-03-30 DIAGNOSIS — M461 Sacroiliitis, not elsewhere classified: Secondary | ICD-10-CM | POA: Diagnosis present

## 2023-03-30 DIAGNOSIS — M069 Rheumatoid arthritis, unspecified: Secondary | ICD-10-CM | POA: Diagnosis present

## 2023-03-30 DIAGNOSIS — R1031 Right lower quadrant pain: Secondary | ICD-10-CM

## 2023-03-30 DIAGNOSIS — I10 Essential (primary) hypertension: Secondary | ICD-10-CM | POA: Diagnosis present

## 2023-03-30 DIAGNOSIS — Z91010 Allergy to peanuts: Secondary | ICD-10-CM

## 2023-03-30 DIAGNOSIS — Z79899 Other long term (current) drug therapy: Secondary | ICD-10-CM | POA: Diagnosis not present

## 2023-03-30 DIAGNOSIS — K50012 Crohn's disease of small intestine with intestinal obstruction: Secondary | ICD-10-CM | POA: Diagnosis present

## 2023-03-30 DIAGNOSIS — R109 Unspecified abdominal pain: Principal | ICD-10-CM

## 2023-03-30 DIAGNOSIS — K5090 Crohn's disease, unspecified, without complications: Secondary | ICD-10-CM | POA: Diagnosis present

## 2023-03-30 DIAGNOSIS — K529 Noninfective gastroenteritis and colitis, unspecified: Secondary | ICD-10-CM | POA: Diagnosis present

## 2023-03-30 LAB — COMPREHENSIVE METABOLIC PANEL
ALT: 9 U/L (ref 0–44)
AST: 15 U/L (ref 15–41)
Albumin: 4 g/dL (ref 3.5–5.0)
Alkaline Phosphatase: 109 U/L (ref 38–126)
Anion gap: 10 (ref 5–15)
BUN: 11 mg/dL (ref 6–20)
CO2: 26 mmol/L (ref 22–32)
Calcium: 9.3 mg/dL (ref 8.9–10.3)
Chloride: 102 mmol/L (ref 98–111)
Creatinine, Ser: 1.03 mg/dL (ref 0.61–1.24)
GFR, Estimated: >60 mL/min (ref >60)
Glucose, Bld: 85 mg/dL (ref 70–99)
Potassium: 3.6 mmol/L (ref 3.5–5.1)
Sodium: 138 mmol/L (ref 135–145)
Total Bilirubin: 0.5 mg/dL (ref <1.2)
Total Protein: 8.4 g/dL — ABNORMAL HIGH (ref 6.5–8.1)

## 2023-03-30 LAB — CBC WITH DIFFERENTIAL/PLATELET
Abs Immature Granulocytes: 0.02 10*3/uL (ref 0.00–0.07)
Basophils Absolute: 0.1 10*3/uL (ref 0.0–0.1)
Basophils Relative: 1 %
Eosinophils Absolute: 0.2 10*3/uL (ref 0.0–0.5)
Eosinophils Relative: 2 %
HCT: 40.9 % (ref 39.0–52.0)
Hemoglobin: 13.5 g/dL (ref 13.0–17.0)
Immature Granulocytes: 0 %
Lymphocytes Relative: 19 %
Lymphs Abs: 1.9 10*3/uL (ref 0.7–4.0)
MCH: 29.3 pg (ref 26.0–34.0)
MCHC: 33 g/dL (ref 30.0–36.0)
MCV: 88.7 fL (ref 80.0–100.0)
Monocytes Absolute: 0.7 10*3/uL (ref 0.1–1.0)
Monocytes Relative: 7 %
Neutro Abs: 7.3 10*3/uL (ref 1.7–7.7)
Neutrophils Relative %: 71 %
Platelets: 428 10*3/uL — ABNORMAL HIGH (ref 150–400)
RBC: 4.61 MIL/uL (ref 4.22–5.81)
RDW: 14.9 % (ref 11.5–15.5)
WBC: 10.2 10*3/uL (ref 4.0–10.5)
nRBC: 0 % (ref 0.0–0.2)

## 2023-03-30 LAB — URINALYSIS, ROUTINE W REFLEX MICROSCOPIC
Bilirubin Urine: NEGATIVE
Glucose, UA: NEGATIVE mg/dL
Hgb urine dipstick: NEGATIVE
Ketones, ur: 15 mg/dL — AB
Leukocytes,Ua: NEGATIVE
Nitrite: NEGATIVE
Specific Gravity, Urine: 1.046 — ABNORMAL HIGH (ref 1.005–1.030)
pH: 7 (ref 5.0–8.0)

## 2023-03-30 LAB — MAGNESIUM: Magnesium: 1.9 mg/dL (ref 1.7–2.4)

## 2023-03-30 LAB — SEDIMENTATION RATE: Sed Rate: 59 mm/h — ABNORMAL HIGH (ref 0–16)

## 2023-03-30 LAB — PHOSPHORUS: Phosphorus: 2.4 mg/dL — ABNORMAL LOW (ref 2.5–4.6)

## 2023-03-30 LAB — C-REACTIVE PROTEIN: CRP: 0.7 mg/dL (ref <1.0)

## 2023-03-30 LAB — LIPASE, BLOOD: Lipase: 12 U/L (ref 11–51)

## 2023-03-30 MED ORDER — MENTHOL 3 MG MT LOZG: 1.0000 | LOZENGE | OROMUCOSAL | Status: DC | PRN

## 2023-03-30 MED ORDER — SODIUM CHLORIDE 0.9 % IV BOLUS
1000.0000 mL | Freq: Once | INTRAVENOUS | Status: AC
  Administered 2023-03-30: 1000 mL via INTRAVENOUS

## 2023-03-30 MED ORDER — SALINE SPRAY 0.65 % NA SOLN: 1.0000 | Freq: Four times a day (QID) | NASAL | Status: DC | PRN

## 2023-03-30 MED ORDER — IOHEXOL 300 MG/ML  SOLN
100.0000 mL | Freq: Once | INTRAMUSCULAR | Status: AC | PRN
  Administered 2023-03-30: 100 mL via INTRAVENOUS

## 2023-03-30 MED ORDER — NAPHAZOLINE-GLYCERIN 0.012-0.25 % OP SOLN: 1.0000 [drp] | Freq: Four times a day (QID) | OPHTHALMIC | Status: DC | PRN

## 2023-03-30 MED ORDER — PIPERACILLIN-TAZOBACTAM 3.375 G IVPB
3.3750 g | Freq: Three times a day (TID) | INTRAVENOUS | Status: DC
  Administered 2023-03-30 – 2023-04-02 (×9): 3.375 g via INTRAVENOUS
  Filled 2023-03-30 (×9): qty 50

## 2023-03-30 MED ORDER — OXYCODONE HCL 5 MG PO TABS: 5.0000 mg | ORAL_TABLET | ORAL | Status: DC | PRN

## 2023-03-30 MED ORDER — MAGIC MOUTHWASH: 15.0000 mL | Freq: Four times a day (QID) | ORAL | Status: DC | PRN

## 2023-03-30 MED ORDER — METOPROLOL TARTRATE 5 MG/5ML IV SOLN
5.0000 mg | Freq: Four times a day (QID) | INTRAVENOUS | Status: DC | PRN
  Administered 2023-03-31 – 2023-04-01 (×2): 5 mg via INTRAVENOUS
  Filled 2023-03-30 (×2): qty 5

## 2023-03-30 MED ORDER — METRONIDAZOLE 500 MG/100ML IV SOLN: 500.0000 mg | Freq: Two times a day (BID) | INTRAVENOUS | Status: DC

## 2023-03-30 MED ORDER — PHENOL 1.4 % MT LIQD: 2.0000 | OROMUCOSAL | Status: DC | PRN

## 2023-03-30 MED ORDER — ACETAMINOPHEN 650 MG RE SUPP: 650.0000 mg | Freq: Four times a day (QID) | RECTAL | Status: DC | PRN

## 2023-03-30 MED ORDER — HYDROMORPHONE HCL 1 MG/ML IJ SOLN
1.0000 mg | Freq: Once | INTRAMUSCULAR | Status: AC
  Administered 2023-03-30: 1 mg via INTRAVENOUS
  Filled 2023-03-30: qty 1

## 2023-03-30 MED ORDER — ONDANSETRON HCL 4 MG/2ML IJ SOLN
4.0000 mg | Freq: Once | INTRAMUSCULAR | Status: AC
  Administered 2023-03-30: 4 mg via INTRAVENOUS
  Filled 2023-03-30: qty 2

## 2023-03-30 MED ORDER — LACTATED RINGERS IV BOLUS
1000.0000 mL | Freq: Once | INTRAVENOUS | Status: AC
  Administered 2023-03-30: 1000 mL via INTRAVENOUS

## 2023-03-30 MED ORDER — IRBESARTAN 75 MG PO TABS: 75.0000 mg | ORAL_TABLET | Freq: Every day | ORAL | Status: DC

## 2023-03-30 MED ORDER — ENOXAPARIN SODIUM 40 MG/0.4ML IJ SOSY
40.0000 mg | PREFILLED_SYRINGE | Freq: Every day | INTRAMUSCULAR | Status: DC
  Administered 2023-03-30 – 2023-04-02 (×4): 40 mg via SUBCUTANEOUS
  Filled 2023-03-30 (×4): qty 0.4

## 2023-03-30 MED ORDER — LACTATED RINGERS IV SOLN: INTRAVENOUS | Status: DC

## 2023-03-30 MED ORDER — ONDANSETRON HCL 4 MG/2ML IJ SOLN: 4.0000 mg | Freq: Four times a day (QID) | INTRAMUSCULAR | Status: DC | PRN

## 2023-03-30 MED ORDER — LACTATED RINGERS IV BOLUS: 1000.0000 mL | Freq: Three times a day (TID) | INTRAVENOUS | Status: DC | PRN

## 2023-03-30 MED ORDER — PROCHLORPERAZINE EDISYLATE 10 MG/2ML IJ SOLN: 5.0000 mg | INTRAMUSCULAR | Status: DC | PRN

## 2023-03-30 MED ORDER — AMLODIPINE BESYLATE 5 MG PO TABS
5.0000 mg | ORAL_TABLET | Freq: Every day | ORAL | Status: DC
  Administered 2023-03-30 – 2023-04-02 (×4): 5 mg via ORAL
  Filled 2023-03-30 (×4): qty 1

## 2023-03-30 MED ORDER — SIMETHICONE 40 MG/0.6ML PO SUSP: 80.0000 mg | Freq: Four times a day (QID) | ORAL | Status: DC | PRN

## 2023-03-30 MED ORDER — SODIUM CHLORIDE 0.9 % IV SOLN: 2.0000 g | Freq: Every day | INTRAVENOUS | Status: DC

## 2023-03-30 MED ORDER — ALUM & MAG HYDROXIDE-SIMETH 200-200-20 MG/5ML PO SUSP: 30.0000 mL | Freq: Four times a day (QID) | ORAL | Status: DC | PRN

## 2023-03-30 MED ORDER — BISACODYL 10 MG RE SUPP: 10.0000 mg | Freq: Two times a day (BID) | RECTAL | Status: DC | PRN

## 2023-03-30 MED ORDER — ACETAMINOPHEN 325 MG PO TABS: 650.0000 mg | ORAL_TABLET | Freq: Four times a day (QID) | ORAL | Status: DC | PRN

## 2023-03-30 NOTE — Progress Notes (Signed)
Plan of Care Note for accepted transfer   Patient: Mark [name] MRN: [id number]   DOA: 03/30/2023  Facility requesting transfer: [facility]. Requesting Provider: Melene Plan, DO. Reason for transfer: Inflammatory bowel disease/partial bowel obstruction. Facility course:  52 year old male with a past medical history of hypertension, rheumatoid arthritis, hidradenitis suppurativa, recently diagnosed with Crohn's disease who presented to the emergency department with a 2-day history of abdominal pain, nausea, multiple episodes of emesis and diarrhea since 03/27/2021.  [facility] will be following at the hospital.  Also, general surgery consult has been requested by the ED.  Lab work: Urinalysis, Routine w reflex microscopic -Urine, Clean Catch [[id number]] (Abnormal)   Collected: 03/30/23 1125   Updated: 03/30/23 1229   Specimen Source: Urine, Clean Catch    Color, Urine YELLOW   APPearance CLEAR   Specific Gravity, Urine 1.046 High    pH 7.0   Glucose, UA NEGATIVE mg/dL   Hgb urine dipstick NEGATIVE   Bilirubin Urine NEGATIVE   Ketones, ur 15 Abnormal  mg/dL   Protein, ur TRACE Abnormal  mg/dL   Nitrite NEGATIVE   Leukocytes,Ua NEGATIVE  Comprehensive metabolic panel [[id number]] (Abnormal)   Collected: 03/30/23 1125   Updated: 03/30/23 1210   Specimen Type: Blood    Sodium 138 mmol/L   Potassium 3.6 mmol/L   Chloride 102 mmol/L   CO2 26 mmol/L   Glucose, Bld 85 mg/dL   BUN 11 mg/dL   Creatinine, Ser 4.69 mg/dL   Calcium 9.3 mg/dL   Total Protein 8.4 High  g/dL   Albumin 4.0 g/dL   AST 15 U/L   ALT 9 U/L   Alkaline Phosphatase 109 U/L   Total Bilirubin 0.5 mg/dL   GFR, Estimated >62 mL/min   Anion gap 10  Lipase, blood [[id number]]   Collected: 03/30/23 1125   Updated: 03/30/23 1210   Specimen Type: Blood    Lipase 12 U/L  CBC with Differential [[id number]] (Abnormal)   Collected: 03/30/23 1125   Updated: 03/30/23 1140   Specimen Type: Blood    WBC 10.2 K/uL   RBC 4.61  MIL/uL   Hemoglobin 13.5 g/dL   HCT 66.4 %   MCV 40.3 fL   MCH 29.3 pg   MCHC 33.0 g/dL   RDW 47.4 %   Platelets 428 High  K/uL   nRBC 0.0 %   Neutrophils Relative % 71 %   Neutro Abs 7.3 K/uL   Lymphocytes Relative 19 %   Lymphs Abs 1.9 K/uL   Monocytes Relative 7 %   Monocytes Absolute 0.7 K/uL   Eosinophils Relative 2 %   Eosinophils Absolute 0.2 K/uL   Basophils Relative 1 %   Basophils Absolute 0.1 K/uL   Immature Granulocytes 0 %   Abs Immature Granulocytes 0.02 K/uL   Imaging:  COMPARISON:  CT abdomen pelvis dated March 23, 2023.   FINDINGS: Lower chest: No acute abnormality.   Hepatobiliary: No focal liver abnormality is seen. No gallstones, gallbladder wall thickening, or biliary dilatation.   Pancreas: Unremarkable. No pancreatic ductal dilatation or surrounding inflammatory changes.   Spleen: Normal in size without focal abnormality.   Adrenals/Urinary Tract: Adrenal glands are unremarkable. Unchanged bilateral renal cortical scarring. Unchanged bilateral renal simple cysts measuring up to 5 4 cm. No follow-up imaging is recommended. Unchanged punctate calculus in the upper pole of the left kidney. No hydronephrosis.   Stomach/Bowel: The stomach is within normal limits. Multiple skip areas of bowel wall thickening again noted. Wall thickening of the duodenum  and proximal jejunum has mildly improved. Continued severe wall thickening of the terminal ileum. Progressive asymmetric irregularity and indistinct enhancing soft tissue density along the right lateral aspect of the terminal ileum at the ventral mesentery with two new tiny foci of extraluminal air (series 2, image 63; series 5, image 60). No drainable fluid collection. New focal area of wall thickening in the distal ileum (series 2, image 56) with dilated small bowel loops and fecalization of bowel contents proximal to this area.   Vascular/Lymphatic: Aortic atherosclerosis. No enlarged  abdominal or pelvic lymph nodes.   Reproductive: Prostate is unremarkable.   Other: Trace free fluid in the pelvis.  No pneumoperitoneum.   Musculoskeletal: No acute or significant osseous findings. Chronic sacroiliitis again noted.   IMPRESSION: 1. Sequelae of inflammatory bowel disease again noted with progressive asymmetric irregularity and indistinct enhancing soft tissue density along the right lateral aspect of the terminal ileum at the ventral mesentery, concerning for worsening penetrating disease with developing sinus tract/fistula. No abscess or pneumoperitoneum. 2. New focal area of wall thickening in the distal ileum with dilated small bowel loops and fecalization of bowel contents proximal to this area, concerning for partial small bowel obstruction. 3. Punctate nonobstructive left nephrolithiasis. 4. Chronic sacroiliitis. 5.  Aortic Atherosclerosis (ICD10-I70.0).    Electronically Signed   By: Obie Dredge M.D.   On: 03/30/2023 12:52  Plan of care: The patient is accepted for admission to Med-surg  unit, at [facility].  Author: Bobette Mo, MD 03/30/2023  Check [email or web address] for on-call coverage.  Nursing staff, Please call [facility] Admits & Consults System-Wide number on Amion as soon as patient's arrival, so appropriate admitting provider can evaluate the pt.

## 2023-03-30 NOTE — Plan of Care (Signed)
  Problem: Education: Goal: Knowledge of General Education information will improve Description Including pain rating scale, medication(s)/side effects and non-pharmacologic comfort measures Outcome: Progressing   

## 2023-03-30 NOTE — ED Notes (Signed)
Attempted to call report x2, nurse unavailable at this time.

## 2023-03-30 NOTE — Consult Note (Addendum)
Mark [name] 07-15-1970  [id number].    Requesting MD: Adela Lank, MD Chief Complaint/Reason for Consult: pSBO, terminal ileitis with possible perforation  HPI:  Mark [name] is a 52 y/o M with PMH hypertension, rheumatoid arthritis, hidradenitis, and possible inflammatory bowel disease who presents with worsening abdominal pain. The patient reports about 3-4 weeks of intermittent RLQ pains that are sharp in nature. He tried to make an appointment with his GI physicians but could not be seen outpatient until march.  He was seen in the ED on 12/20 where CT scan of the abdomen showed skip lesions of the small bowel and thickening of the terminal ileum and enhancement of the ventral mesentery. He was discharged on cipro/flagyl/bentyl. The patient tells me that his pain became more severe last night and early this morning he had a to come to the ED. pain described as sharp, cramping, non-radiating, most severe over his right side. Associated symptoms include small volume, loose stools and occasional emesis after he tries to drink. He has had poor PO intake for over two weeks now. He expresses some confusion about if he has crohn's or not because he was told his colonoscopy in October was normal but doctors recently have been telling him he has Crohn's. Per chart review the patient underwent his first screening colonoscopy on 01/2023 by Dr. Barron Alvine and his colon appeared normal at that time. He currently takes Enbrel, a biologic agent for his RA (last reported dose was 12/18 or 12/19, he gets injections weekly). He reports occasional cigar use but denies cigarette use, alcohol or drug use. He denies a history of abdominal surgery. He has had bilateral excision of axillary hidradenitis in 2020.   ROS: Review of Systems  All other systems reviewed and are negative.   Family History  Problem Relation Age of Onset   Heart failure Mother    Aneurysm Father    Colon polyps Neg Hx    Colon cancer  Neg Hx    Esophageal cancer Neg Hx    Stomach cancer Neg Hx    Rectal cancer Neg Hx     Past Medical History:  Diagnosis Date   Arthritis    RA   Hip fracture (HCC)    Hypertension    Rheumatoid arthritis (HCC)     Past Surgical History:  Procedure Laterality Date   HYDRADENITIS EXCISION Bilateral 09/18/2018   Procedure: EXCISION BILATERAL AXILLARY HIDRADENITIS;  Surgeon: Harriette Bouillon, MD;  Location: [facility];  Service: General;  Laterality: Bilateral;   INCISION AND DRAINAGE PERIRECTAL ABSCESS N/A 07/17/2018   Procedure: IRRIGATION AND DEBRIDEMENT PERIRECTAL ABSCESS;  Surgeon: Axel Filler, MD;  Location[facility] OR;  Service: General;  Laterality: N/A;    Social History:  reports that he has been smoking cigars. He has never used smokeless tobacco. He reports that he does not currently use alcohol. He reports that he does not currently use drugs. Frequency: 3.00 times per week.  Allergies:  Allergies  Allergen Reactions   Peanut-Containing Drug Products Itching and Rash    (Not in a hospital admission)    Physical Exam: Blood pressure (!) 199/90, pulse 75, temperature 98.4 F (36.9 C), resp. rate 18, height 5\' 10"  (1.778 m), weight 72 kg, SpO2 100%. General: Pleasant b;ack male laying on hospital bed, appears stated age, NAD. HEENT: head -normocephalic, atraumatic; Eyes: PERRLA, no conjunctival injection, anicteric sclerae Neck- Trachea is midline, no thyromegaly or JVD appreciated.  CV- RRR, normal S1/S2, no M/R/G, no  lower extremity edema Pulm- breathing is non-labored ORA Abd- soft, mild distention, tender to light palpation in the RLQ without peritonitis, no hernias or masses.  GU- deferred  MSK- UE/LE symmetrical, no cyanosis, clubbing, or edema. Neuro- CN II-XII grossly in tact, no paresthesias. Psych- Alert and Oriented x3 with appropriate affect Skin: warm and dry, no rashes or lesions   Results for orders placed or performed during the  hospital encounter of 03/30/23 (from the past 48 hours)  CBC with Differential     Status: Abnormal   Collection Time: 03/30/23 11:25 AM  Result Value Ref Range   WBC 10.2 4.0 - 10.5 K/uL   RBC 4.61 4.22 - 5.81 MIL/uL   Hemoglobin 13.5 13.0 - 17.0 g/dL   HCT 66.0 63.0 - 16.0 %   MCV 88.7 80.0 - 100.0 fL   MCH 29.3 26.0 - 34.0 pg   MCHC 33.0 30.0 - 36.0 g/dL   RDW 10.9 32.3 - 55.7 %   Platelets 428 (H) 150 - 400 K/uL   nRBC 0.0 0.0 - 0.2 %   Neutrophils Relative % 71 %   Neutro Abs 7.3 1.7 - 7.7 K/uL   Lymphocytes Relative 19 %   Lymphs Abs 1.9 0.7 - 4.0 K/uL   Monocytes Relative 7 %   Monocytes Absolute 0.7 0.1 - 1.0 K/uL   Eosinophils Relative 2 %   Eosinophils Absolute 0.2 0.0 - 0.5 K/uL   Basophils Relative 1 %   Basophils Absolute 0.1 0.0 - 0.1 K/uL   Immature Granulocytes 0 %   Abs Immature Granulocytes 0.02 0.00 - 0.07 K/uL    Comment: Performed at [facility], [address] [place], [place] [address]  Comprehensive metabolic panel     Status: Abnormal   Collection Time: 03/30/23 11:25 AM  Result Value Ref Range   Sodium 138 135 - 145 mmol/L   Potassium 3.6 3.5 - 5.1 mmol/L   Chloride 102 98 - 111 mmol/L   CO2 26 22 - 32 mmol/L   Glucose, Bld 85 70 - 99 mg/dL    Comment: Glucose reference range applies only to samples taken after fasting for at least 8 hours.   BUN 11 6 - 20 mg/dL   Creatinine, Ser 5.42 0.61 - 1.24 mg/dL   Calcium 9.3 8.9 - 70.6 mg/dL   Total Protein 8.4 (H) 6.5 - 8.1 g/dL   Albumin 4.0 3.5 - 5.0 g/dL   AST 15 15 - 41 U/L   ALT 9 0 - 44 U/L   Alkaline Phosphatase 109 38 - 126 U/L   Total Bilirubin 0.5 <1.2 mg/dL   GFR, Estimated >23 >76 mL/min    Comment: (NOTE) Calculated using the CKD-EPI Creatinine Equation (2021)    Anion gap 10 5 - 15    Comment: Performed at [facility], [address] [place], [place] [address]  Lipase, blood     Status: None   Collection Time: 03/30/23 11:25 AM  Result  Value Ref Range   Lipase 12 11 - 51 U/L    Comment: Performed at [facility], [address] [place], [place] [address]  Urinalysis, Routine w reflex microscopic -Urine, Clean Catch     Status: Abnormal   Collection Time: 03/30/23 11:25 AM  Result Value Ref Range   Color, Urine YELLOW YELLOW   APPearance CLEAR CLEAR   Specific Gravity, Urine 1.046 (H) 1.005 - 1.030   pH 7.0 5.0 - 8.0   Glucose, UA NEGATIVE NEGATIVE  mg/dL   Hgb urine dipstick NEGATIVE NEGATIVE   Bilirubin Urine NEGATIVE NEGATIVE   Ketones, ur 15 (A) NEGATIVE mg/dL   Protein, ur TRACE (A) NEGATIVE mg/dL   Nitrite NEGATIVE NEGATIVE   Leukocytes,Ua NEGATIVE NEGATIVE    Comment: Performed at [facility], [address], [place], [place] [address]  Magnesium     Status: None   Collection Time: 03/30/23 11:37 AM  Result Value Ref Range   Magnesium 1.9 1.7 - 2.4 mg/dL    Comment: Performed at [facility], [address], [place], [place] [address]  Phosphorus     Status: Abnormal   Collection Time: 03/30/23 11:37 AM  Result Value Ref Range   Phosphorus 2.4 (L) 2.5 - 4.6 mg/dL    Comment: Performed at [facility], [address] [place], [place] [address]   CT ABDOMEN PELVIS W CONTRAST Result Date: 03/30/2023 CLINICAL DATA:  Right lower quadrant abdominal pain. Recently diagnosed with inflammatory bowel disease. EXAM: CT ABDOMEN AND PELVIS WITH CONTRAST TECHNIQUE: Multidetector CT imaging of the abdomen and pelvis was performed using the standard protocol following bolus administration of intravenous contrast. RADIATION DOSE REDUCTION: This exam was performed according to the departmental dose-optimization program which includes automated exposure control, adjustment of the mA and/or kV according to patient size and/or use of iterative reconstruction technique. CONTRAST:  OMNIPAQUE IOHEXOL 300 MG/ML  SOLN COMPARISON:  CT abdomen pelvis  dated March 23, 2023. FINDINGS: Lower chest: No acute abnormality. Hepatobiliary: No focal liver abnormality is seen. No gallstones, gallbladder wall thickening, or biliary dilatation. Pancreas: Unremarkable. No pancreatic ductal dilatation or surrounding inflammatory changes. Spleen: Normal in size without focal abnormality. Adrenals/Urinary Tract: Adrenal glands are unremarkable. Unchanged bilateral renal cortical scarring. Unchanged bilateral renal simple cysts measuring up to 5 4 cm. No follow-up imaging is recommended. Unchanged punctate calculus in the upper pole of the left kidney. No hydronephrosis. Stomach/Bowel: The stomach is within normal limits. Multiple skip areas of bowel wall thickening again noted. Wall thickening of the duodenum and proximal jejunum has mildly improved. Continued severe wall thickening of the terminal ileum. Progressive asymmetric irregularity and indistinct enhancing soft tissue density along the right lateral aspect of the terminal ileum at the ventral mesentery with two new tiny foci of extraluminal air (series 2, image 63; series 5, image 60). No drainable fluid collection. New focal area of wall thickening in the distal ileum (series 2, image 56) with dilated small bowel loops and fecalization of bowel contents proximal to this area. Vascular/Lymphatic: Aortic atherosclerosis. No enlarged abdominal or pelvic lymph nodes. Reproductive: Prostate is unremarkable. Other: Trace free fluid in the pelvis.  No pneumoperitoneum. Musculoskeletal: No acute or significant osseous findings. Chronic sacroiliitis again noted. IMPRESSION: 1. Sequelae of inflammatory bowel disease again noted with progressive asymmetric irregularity and indistinct enhancing soft tissue density along the right lateral aspect of the terminal ileum at the ventral mesentery, concerning for worsening penetrating disease with developing sinus tract/fistula. No abscess or pneumoperitoneum. 2. New focal area of  wall thickening in the distal ileum with dilated small bowel loops and fecalization of bowel contents proximal to this area, concerning for partial small bowel obstruction. 3. Punctate nonobstructive left nephrolithiasis. 4. Chronic sacroiliitis. 5.  Aortic Atherosclerosis (ICD10-I70.0). Electronically Signed   By: Obie Dredge M.D.   On: 03/30/2023 12:52      Assessment/Plan pSBO, likely due to underlying inflammatory bowel disease  52 y/o M with symptoms concerning for inflammatory bowel disease who presents with  clinical and radiographic evidence of pSBO. CT shows terminal ileitis with tiny foci of free air, without abscess. He is afebrile, hypertensive, with WBC of 10.2. Of note, he is on immunosuppressive medications for his RA. CMP is WNL. He does not have peritonitis on exam. No emergent role for surgery. Recommend IV abx and GI consult for medical management of presumed crohn's/further workup of IBD. Strict NPO. Will need NG tube if he develops worsening nausea/vomiting or distention/pain. He is having flatus and small volume stools.   FEN - NPO, IVF VTE - SCD's, ok for chemical VTE ppx from [facility] standpoint ID - Rocephin/Flagyl Admit - TR[facility] service   HTN RA   I reviewed nursing notes, ED provider notes, hospitalist notes, last 24 h vitals and pain scores, last 48 h intake and output, last 24 h labs and trends, and last 24 h imaging results.  Adam Phenix, [facility] Surgery 03/30/2023, 2:53 PM Please see Amion for pager number during day hours 7:00am-4:30pm or 7:00am -11:30am on weekends

## 2023-03-30 NOTE — ED Provider Notes (Signed)
[facility] EMERGENCY DEPARTMENT AT [facility] Provider Note   CSN: [id number] Arrival date & time: 03/30/23  1008     History  Chief Complaint  Patient presents with   Abdominal Pain   HPI Mark [name] is a 52 y.o. male presenting for abdominal pain.  Started 2 days ago.  Pain is primarily in the right lower quadrant of his abdomen and radiates to the umbilicus.  States it is an intense and sharp pain.  States he was diagnosed a week ago with Crohn's disease.  Endorsing associated nausea vomiting diarrhea.  Denies fever and chills.  States the pain is "the worst he is ever felt in his life".  Denies urinary symptoms.   Abdominal Pain      Home Medications Prior to Admission medications   Medication Sig Start Date End Date Taking? Authorizing Provider  ADVIL 200 MG CAPS Take 200-400 mg by mouth every 8 (eight) hours as needed (for pain).   Yes [provider]  ciprofloxacin (CIPRO) 500 MG tablet Take 1 tablet (500 mg total) by mouth 2 (two) times daily. 03/23/23  Yes Mesner, Barbara Cower, MD  dicyclomine (BENTYL) 20 MG tablet Take 1 tablet (20 mg total) by mouth 2 (two) times daily as needed for spasms (abdominal cramping). 03/23/23  Yes Mesner, Barbara Cower, MD  docusate sodium (COLACE) 100 MG capsule Take 1 capsule (100 mg total) by mouth every 12 (twelve) hours. 03/23/23  Yes Mesner, Barbara Cower, MD  ENBREL SURECLICK 50 MG/ML injection Inject 50 mg into the skin every Monday. 06/15/18  Yes [provider]  metroNIDAZOLE (FLAGYL) 500 MG tablet Take 1 tablet (500 mg total) by mouth 2 (two) times daily. 03/23/23  Yes Mesner, Barbara Cower, MD  Psyllium (METAMUCIL SMOOTH TEXTURE PO) Take 3.4-4 g by mouth See admin instructions. Mix 3.4-4 grams into eight ounces of water and drink by mouth once a day   Yes [provider]  amLODipine (NORVASC) 5 MG tablet Take 1 tablet (5 mg total) by mouth daily. Patient not taking: Reported on 03/30/2023 10/26/22   de [place], Buren Kos, MD   oxyCODONE-acetaminophen (PERCOCET) 5-325 MG tablet Take 2 tablets by mouth every 4 (four) hours as needed. Patient not taking: Reported on 03/30/2023 03/23/23   Mesner, Barbara Cower, MD  valsartan (DIOVAN) 80 MG tablet Take 1 tablet (80 mg total) by mouth daily. Patient not taking: Reported on 03/30/2023 10/26/22   de [place], Buren Kos, MD      Allergies    Peanut-containing drug products    Review of Systems   Review of Systems  Gastrointestinal:  Positive for abdominal pain.    Physical Exam Updated Vital Signs BP (!) 174/93 (BP Location: Right Arm)   Pulse 68   Temp 98.3 F (36.8 C)   Resp 18   Ht 5\' 10"  (1.778 m)   Wt 73.3 kg   SpO2 100%   BMI 23.19 kg/m  Physical Exam Vitals and nursing note reviewed.  HENT:     Head: Normocephalic and atraumatic.     Mouth/Throat:     Mouth: Mucous membranes are moist.  Eyes:     General:        Right eye: No discharge.        Left eye: No discharge.     Conjunctiva/sclera: Conjunctivae normal.  Cardiovascular:     Rate and Rhythm: Normal rate and regular rhythm.     Pulses: Normal pulses.     Heart sounds: Normal heart sounds.  Pulmonary:  Effort: Pulmonary effort is normal.     Breath sounds: Normal breath sounds.  Abdominal:     General: Abdomen is flat.     Palpations: Abdomen is soft.     Tenderness: There is abdominal tenderness in the right lower quadrant and periumbilical area.  Skin:    General: Skin is warm and dry.  Neurological:     General: No focal deficit present.  Psychiatric:        Mood and Affect: Mood normal.     ED Results / Procedures / Treatments   Labs (all labs ordered are listed, but only abnormal results are displayed) Labs Reviewed  CBC WITH DIFFERENTIAL/PLATELET - Abnormal; Notable for the following components:      Result Value   Platelets 428 (*)    All other components within normal limits  COMPREHENSIVE METABOLIC PANEL - Abnormal; Notable for the following components:   Total Protein  8.4 (*)    All other components within normal limits  URINALYSIS, ROUTINE W REFLEX MICROSCOPIC - Abnormal; Notable for the following components:   Specific Gravity, Urine 1.046 (*)    Ketones, ur 15 (*)    Protein, ur TRACE (*)    All other components within normal limits  PHOSPHORUS - Abnormal; Notable for the following components:   Phosphorus 2.4 (*)    All other components within normal limits  SEDIMENTATION RATE - Abnormal; Notable for the following components:   Sed Rate 59 (*)    All other components within normal limits  GASTROINTESTINAL PANEL BY PCR, STOOL (REPLACES STOOL CULTURE)  C DIFFICILE QUICK SCREEN W PCR REFLEX    CALPROTECTIN, FECAL  LIPASE, BLOOD  MAGNESIUM  C-REACTIVE PROTEIN  CBC  POTASSIUM  CREATININE, SERUM  QUANTIFERON-TB GOLD PLUS  THIOPURINE METHYLTRANSFERASE (TPMT), RBC  HEPATITIS B SURFACE ANTIGEN  HEPATITIS B CORE ANTIBODY, TOTAL  HEPATITIS B SURFACE ANTIBODY,QUALITATIVE  HEPATITIS C ANTIBODY    EKG None  Radiology CT ABDOMEN PELVIS W CONTRAST Result Date: 03/30/2023 CLINICAL DATA:  Right lower quadrant abdominal pain. Recently diagnosed with inflammatory bowel disease. EXAM: CT ABDOMEN AND PELVIS WITH CONTRAST TECHNIQUE: Multidetector CT imaging of the abdomen and pelvis was performed using the standard protocol following bolus administration of intravenous contrast. RADIATION DOSE REDUCTION: This exam was performed according to the departmental dose-optimization program which includes automated exposure control, adjustment of the mA and/or kV according to patient size and/or use of iterative reconstruction technique. CONTRAST:  OMNIPAQUE IOHEXOL 300 MG/ML  SOLN COMPARISON:  CT abdomen pelvis dated March 23, 2023. FINDINGS: Lower chest: No acute abnormality. Hepatobiliary: No focal liver abnormality is seen. No gallstones, gallbladder wall thickening, or biliary dilatation. Pancreas: Unremarkable. No pancreatic ductal dilatation or  surrounding inflammatory changes. Spleen: Normal in size without focal abnormality. Adrenals/Urinary Tract: Adrenal glands are unremarkable. Unchanged bilateral renal cortical scarring. Unchanged bilateral renal simple cysts measuring up to 5 4 cm. No follow-up imaging is recommended. Unchanged punctate calculus in the upper pole of the left kidney. No hydronephrosis. Stomach/Bowel: The stomach is within normal limits. Multiple skip areas of bowel wall thickening again noted. Wall thickening of the duodenum and proximal jejunum has mildly improved. Continued severe wall thickening of the terminal ileum. Progressive asymmetric irregularity and indistinct enhancing soft tissue density along the right lateral aspect of the terminal ileum at the ventral mesentery with two new tiny foci of extraluminal air (series 2, image 63; series 5, image 60). No drainable fluid collection. New focal area of wall thickening in the  distal ileum (series 2, image 56) with dilated small bowel loops and fecalization of bowel contents proximal to this area. Vascular/Lymphatic: Aortic atherosclerosis. No enlarged abdominal or pelvic lymph nodes. Reproductive: Prostate is unremarkable. Other: Trace free fluid in the pelvis.  No pneumoperitoneum. Musculoskeletal: No acute or significant osseous findings. Chronic sacroiliitis again noted. IMPRESSION: 1. Sequelae of inflammatory bowel disease again noted with progressive asymmetric irregularity and indistinct enhancing soft tissue density along the right lateral aspect of the terminal ileum at the ventral mesentery, concerning for worsening penetrating disease with developing sinus tract/fistula. No abscess or pneumoperitoneum. 2. New focal area of wall thickening in the distal ileum with dilated small bowel loops and fecalization of bowel contents proximal to this area, concerning for partial small bowel obstruction. 3. Punctate nonobstructive left nephrolithiasis. 4. Chronic sacroiliitis. 5.   Aortic Atherosclerosis (ICD10-I70.0). Electronically Signed   By: Obie Dredge M.D.   On: 03/30/2023 12:52    Procedures Procedures    Medications Ordered in ED Medications  lactated ringers bolus 1,000 mL (has no administration in time range)  ondansetron (ZOFRAN) injection 4 mg (has no administration in time range)  prochlorperazine (COMPAZINE) injection 5-10 mg (has no administration in time range)  phenol (CHLORASEPTIC) mouth spray 2 spray (has no administration in time range)  menthol-cetylpyridinium (CEPACOL) lozenge 3 mg (has no administration in time range)  magic mouthwash (has no administration in time range)  alum & mag hydroxide-simeth (MAALOX/MYLANTA) 200-200-20 MG/5ML suspension 30 mL (has no administration in time range)  simethicone (MYLICON) 40 MG/0.6ML suspension 80 mg (has no administration in time range)  bisacodyl (DULCOLAX) suppository 10 mg (has no administration in time range)  naphazoline-glycerin (CLEAR EYES REDNESS) ophth solution 1-2 drop (has no administration in time range)  sodium chloride (OCEAN) 0.65 % nasal spray 1-2 spray (has no administration in time range)  metoprolol tartrate (LOPRESSOR) injection 5 mg (has no administration in time range)  lactated ringers infusion ( Intravenous New Bag/Given 03/30/23 1729)  piperacillin-tazobactam (ZOSYN) IVPB 3.375 g (3.375 g Intravenous New Bag/Given 03/30/23 1734)  amLODipine (NORVASC) tablet 5 mg (5 mg Oral Given 03/30/23 1734)  enoxaparin (LOVENOX) injection 40 mg (40 mg Subcutaneous Given 03/30/23 1846)  acetaminophen (TYLENOL) tablet 650 mg (has no administration in time range)    Or  acetaminophen (TYLENOL) suppository 650 mg (has no administration in time range)  oxyCODONE (Oxy IR/ROXICODONE) immediate release tablet 5 mg (has no administration in time range)  sodium chloride 0.9 % bolus 1,000 mL (0 mLs Intravenous Stopped 03/30/23 1247)  ondansetron (ZOFRAN) injection 4 mg (4 mg Intravenous Given  03/30/23 1128)  HYDROmorphone (DILAUDID) injection 1 mg (1 mg Intravenous Given 03/30/23 1129)  iohexol (OMNIPAQUE) 300 MG/ML solution 100 mL (100 mLs Intravenous Contrast Given 03/30/23 1137)  lactated ringers bolus 1,000 mL (1,000 mLs Intravenous New Bag/Given 03/30/23 1615)    ED Course/ Medical Decision Making/ A&P Clinical Course as of 03/30/23 1850  Fri Mar 30, 2023  1357 Dr. Robb Matar [JR]    Clinical Course User Index [JR] Gareth Eagle, PA-C                                 Medical Decision Making Amount and/or Complexity of Data Reviewed Labs: ordered. Radiology: ordered.  Risk Prescription drug management. Decision regarding hospitalization.   Initial Impression and Ddx 52 year old well-appearing male presenting for abdominal pain nausea vomiting.  Exam notable for right lower quadrant tenderness.  DDx includes appendicitis, diverticulitis, kidney stone, IBD flare, other. Patient PMH that increases complexity of ED encounter: Recent evaluation for abdominal pain revealing findings consistent with IBD  Interpretation of Diagnostics - I independent reviewed and interpreted the labs as followed: none  - I independently visualized the following imaging with scope of interpretation limited to determining acute life threatening conditions related to emergency care: CT, which revealed  1. Sequelae of inflammatory bowel disease again noted with  progressive asymmetric irregularity and indistinct enhancing soft  tissue density along the right lateral aspect of the terminal ileum  at the ventral mesentery, concerning for worsening penetrating  disease with developing sinus tract/fistula. No abscess or  pneumoperitoneum.  2. New focal area of wall thickening in the distal ileum with  dilated small bowel loops and fecalization of bowel contents  proximal to this area, concerning for partial small bowel  obstruction.  3. Punctate nonobstructive left nephrolithiasis.  4.  Chronic sacroiliitis.    Patient Reassessment and Ultimate Disposition/Management Workup most suggestive of symptoms related to IBD with questionable associated small bowel obstruction.  Initially discussed with GI who agreed to see patient during admission.  Also discussed patient with general surgery, spoke to their PA who had no further recommendations at that time but did state that they would be available for consult if needed.  Workup not suggestive of sepsis.  Patient is in considerable pain on reassessment.  Admitted to hospital service with Dr. Sanda Klein for pain management and evaluation of IBD.  Patient management required discussion with the following services or consulting groups:  Hospitalist Service, Gastroenterology, and General/Trauma Surgery  Complexity of Problems Addressed Acute complicated illness or Injury  Additional Data Reviewed and Analyzed Further history obtained from: Past medical history and medications listed in the EMR, Prior ED visit notes, and Recent discharge summary  Patient Encounter Risk Assessment Consideration of hospitalization         Final Clinical Impression(s) / ED Diagnoses Final diagnoses:  Abdominal pain, unspecified abdominal location    Rx / DC Orders ED Discharge Orders     None         Gareth Eagle, PA-C 03/30/23 1850    Melene Plan, DO 03/31/23 [phone]

## 2023-03-30 NOTE — ED Triage Notes (Signed)
C/o severe right sided ABD pain. States he has recently been diagnosed with Chron's disease and has appt with PCP on Monday. N/V/D since 12/25.

## 2023-03-30 NOTE — H&P (Signed)
History and Physical    Mark [name] ZOX:[id number] DOB: 05/29/1970 DOA: 03/30/2023  PCP: de [place], Raymond J, MD   Chief Complaint:  abd pain  HPI: Mark [name] is a 52 y.o. male with medical history significant of rheumatoid arthritis on Enbrel, hypertension, hidradenitis suppurativa who presents outside hospital with abdominal pain, nausea and vomiting.  On arrival he was afebrile hemodynamically stable.  Labs were obtained which demonstrated WBC 10.2, hemoglobin 13.5, platelets 428, creatinine 1.03.  CT abdomen pelvis was obtained which demonstrated evidence of inflammatory bowel disease with worsening thickening around the terminal ileum and concern for developing penetrating Crohn's disease.  There is possibility of a developing small bowel obstruction so patient was transferred to [facility] for further assessment.  Surgery and GI was consulted.  On evaluation he has persistent diffuse abdominal pain worse in the right lower quadrant.  He was previously started on Cipro and Flagyl.  He endorses it and diarrhea and occasional dark bloody stools.  Of note, patient is been seen year ago for nausea vomiting and had a CT scan that time which demonstrated concern for Crohn's disease.  He was discharged outpatient follow-up.  In addition, he had a colonoscopy in October which was normal.   Review of Systems: Review of Systems  Constitutional:  Negative for chills, fever and weight loss.  HENT:  Negative for hearing loss.   Eyes: Negative.   Respiratory: Negative.    Cardiovascular: Negative.   Gastrointestinal:  Positive for abdominal pain, diarrhea, nausea and vomiting. Negative for heartburn.  Genitourinary: Negative.  Negative for dysuria.  Musculoskeletal: Negative.   Neurological: Negative.   Endo/Heme/Allergies: Negative.   Psychiatric/Behavioral: Negative.    All other systems reviewed and are negative.    As per HPI otherwise 10 point review of systems negative.    Allergies  Allergen Reactions   Peanut-Containing Drug Products Itching and Rash    Past Medical History:  Diagnosis Date   Arthritis    RA   Hip fracture (HCC)    Hypertension    Rheumatoid arthritis (HCC)     Past Surgical History:  Procedure Laterality Date   HYDRADENITIS EXCISION Bilateral 09/18/2018   Procedure: EXCISION BILATERAL AXILLARY HIDRADENITIS;  Surgeon: Harriette Bouillon, MD;  Location: [facility];  Service: General;  Laterality: Bilateral;   INCISION AND DRAINAGE PERIRECTAL ABSCESS N/A 07/17/2018   Procedure: IRRIGATION AND DEBRIDEMENT PERIRECTAL ABSCESS;  Surgeon: Axel Filler, MD;  Location[facility] OR;  Service: General;  Laterality: N/A;     reports that he has been smoking cigars. He has never used smokeless tobacco. He reports that he does not currently use alcohol. He reports that he does not currently use drugs. Frequency: 3.00 times per week.  Family History  Problem Relation Age of Onset   Heart failure Mother    Aneurysm Father    Colon polyps Neg Hx    Colon cancer Neg Hx    Esophageal cancer Neg Hx    Stomach cancer Neg Hx    Rectal cancer Neg Hx     Prior to Admission medications   Medication Sig Start Date End Date Taking? Authorizing Provider  acetaminophen (TYLENOL) 325 MG tablet Take 650 mg by mouth every 6 (six) hours as needed for mild pain or moderate pain. Patient not taking: Reported on 12/28/2022    [provider]  amLODipine (NORVASC) 5 MG tablet Take 1 tablet (5 mg total) by mouth daily. 10/26/22   de [place], Raymond J, MD  ciprofloxacin (CIPRO) 500 MG tablet Take 1 tablet (500 mg total) by mouth 2 (two) times daily. 03/23/23   Mesner, Barbara Cower, MD  dicyclomine (BENTYL) 20 MG tablet Take 1 tablet (20 mg total) by mouth 2 (two) times daily as needed for spasms (abdominal cramping). 03/23/23   Mesner, Barbara Cower, MD  docusate sodium (COLACE) 100 MG capsule Take 1 capsule (100 mg total) by mouth every 12 (twelve) hours.  03/23/23   Mesner, Barbara Cower, MD  ENBREL SURECLICK 50 MG/ML injection Inject 50 mg into the skin every Saturday.  06/15/18   [provider]  metroNIDAZOLE (FLAGYL) 500 MG tablet Take 1 tablet (500 mg total) by mouth 2 (two) times daily. 03/23/23   Mesner, Barbara Cower, MD  oxyCODONE-acetaminophen (PERCOCET) 5-325 MG tablet Take 2 tablets by mouth every 4 (four) hours as needed. 03/23/23   Mesner, Barbara Cower, MD  prednisoLONE acetate (PRED FORTE) 1 % ophthalmic suspension Place 1 drop into the right eye 4 (four) times daily. Patient not taking: Reported on 12/28/2022 11/08/22   [provider]  valsartan (DIOVAN) 80 MG tablet Take 1 tablet (80 mg total) by mouth daily. 10/26/22   de [place], Raymond J, MD    Physical Exam: Vitals:   03/30/23 1230 03/30/23 1245 03/30/23 1440 03/30/23 1514  BP: (!) 186/93 (!) 162/93 (!) 199/90 (!) 197/95  Pulse: 63 71 75 74  Resp:  18 18 16   Temp:    98.6 F (37 C)  TempSrc:    Oral  SpO2: 100% 99% 100% 99%  Weight:    73.3 kg  Height:    5\' 10"  (1.778 m)   Physical Exam Constitutional:      Appearance: He is normal weight.  HENT:     Head: Normocephalic.     Mouth/Throat:     Mouth: Mucous membranes are moist.  Eyes:     Extraocular Movements: Extraocular movements intact.  Cardiovascular:     Rate and Rhythm: Normal rate and regular rhythm.     Heart sounds: Normal heart sounds.  Pulmonary:     Effort: Pulmonary effort is normal.     Breath sounds: Normal breath sounds.  Abdominal:     General: Abdomen is flat.     Palpations: Abdomen is soft.  Skin:    General: Skin is warm.     Capillary Refill: Capillary refill takes less than 2 seconds.  Neurological:     General: No focal deficit present.     Mental Status: He is alert.        Labs on Admission: I have personally reviewed the patients's labs and imaging studies.  Assessment/Plan Principal Problem:   Crohn's ileitis, with intestinal obstruction (HCC) Active Problems:    Inflammatory bowel disease   Immunosuppression due to drug therapy (HCC)   # Crohn's flare - Patient has CT findings concerning for small bowel Crohn's without colonic involvement - Normal colonoscopy 2 months ago - On Enbrel for rheumatoid arthritis  Plan: Check inflammatory markers Check fecal calprotectin Check infectious stool studies to rule out infection prior to initiation of steroids CLD No need for abx at this time.  Appreciate GI consultation Appreciate surgical involvement  # Rheumatoid arthritis-patient is on Enbrel at home.  Will continue to monitor  # Hypertension-continue amlodipine, irbesartan  #Hypophosphatemia- replete P  Admission status: Inpatient Med-Surg  Certification: The appropriate patient status for this patient is INPATIENT. Inpatient status is judged to be reasonable and necessary in order to provide the required intensity of service to  ensure the patient's safety. The patient's presenting symptoms, physical exam findings, and initial radiographic and laboratory data in the context of their chronic comorbidities is felt to place them at high risk for further clinical deterioration. Furthermore, it is not anticipated that the patient will be medically stable for discharge from the hospital within 2 midnights of admission.   * I certify that at the point of admission it is my clinical judgment that the patient will require inpatient hospital care spanning beyond 2 midnights from the point of admission due to high intensity of service, high risk for further deterioration and high frequency of surveillance required.Alan Mulder MD [facility] If 7PM-7AM, please contact night-coverage [email or web address]  03/30/2023, 4:52 PM

## 2023-03-30 NOTE — ED Notes (Signed)
Attempted to call report x 1, nurse unavailable at this time 

## 2023-03-30 NOTE — Consult Note (Addendum)
Referring Provider: Dr. Alan Mulder Primary Care Physician:  de [place], Buren Kos, MD Primary Gastroenterologist:  Dr. Barron Alvine  Reason for Consultation: Abdominal pain, CTAP concerning for IBD  HPI: Mark [name] is a 52 y.o. male with a past medical history of rheumatoid arthritis (previously on Humir, switched to Enbrel 1 1/2 years ago), hypertension, anemia, hidradenitis status post excision 09/2018 and perirectal abscess status post I&D 07/2018.  He underwent a screening colonoscopy by Dr. Barron Alvine 01/10/2023 which showed internal hemorrhoids otherwise was normal.  Since then, he endorsed having decreased oral intake and his bowel movements changed, stools became looser.  He developed RLQ pain with nausea/vomiting with increased urinary frequency therefore he presented to the ED 03/23/2023 for further evaluation.  Labs showed a WBC count of 9.5.  Hemoglobin 12.7 down from 13.6 on 11/16/2022.  Hematocrit 38.9.  MCV 89.0.  Platelet 423.  Normal CMP and lipase.  CTAP with contrast showed, skip areas of bowel wall thickening including at the duodenum and proximal jejunum, terminal ileum, left lower quadrant small bowel, and likely at the left colon with most pronounced swelling at the terminal ileum where there is indistinct enhancing soft tissue density at the ventral mesentery, regional peritoneal thickening considered reactive peritonitis without pneumoperitoneum or abscess.  He was discharged home on Cipro 500 mg twice daily x 7 days, Metronidazole 500 mg twice daily x 7 days. Dicyclomine 20 mg twice daily as needed, Docusate sodium 100 mg daily and Oxycodone 5/325 mg 2 tabs every 4 hours as needed with recommendations for GI follow-up.  He presented to [facility] earlier today with worsening right sided abdominal pain with N/V and diarrhea since 03/28/2023.  Labs in the ED showed a WBC count of 10.2.  Hemoglobin 13.5.  Hematocrit 40.9.  Platelet 428.  BUN 11.  Creatinine 1.03.   Normal LFTs.  Lipase 12.  CTAP with contrast Sequelae of inflammatory bowel disease again noted with progressive asymmetric irregularity and indistinct enhancing soft tissue density along the right lateral aspect of the terminal ileum at the ventral mesentery, concerning for worsening penetrating disease with developing sinus tract/fistula. No abscess or pneumoperitoneum. New focal area of wall thickening in the distal ileum with dilated small bowel loops and fecalization of bowel contents proximal to this area, concerning for partial small bowel obstruction.  Chronic sacroiliitis.  A GI consult was requested for further evaluation/management for suspected Crohn's disease.  He has RLQ pain significantly improved after he started taking Cipro and Flagyl for few days as prescribed by the ED as noted above.  However, he had recurrence of RLQ pain with N/V and nonbloody diarrhea on 12/25.  He last vomited around 5 AM this morning, emesis described as clear water.  No hematemesis.  Last episode of diarrhea was around the same time this morning.  Since then, he has not passed any gas per the rectum.  He stated his bowel pattern change shortly after his colonoscopy 01/2023.  Prior to his colonoscopy he typically passed a normal formed brown bowel movement daily, post colonoscopy he started passing 2 loose nonbloody stools daily.  Sometimes his stools are dark, no melenic stools.  He rarely takes ibuprofen 200 mg 2 tabs as needed for rheumatoid arthritis pain.  Rheumatoid arthritis pain is well-controlled on Enbrel.  No known family history of IBD or other autoimmune diseases.  Patient denied having any known history of Crohn's disease or hospital admission for abdominal pain.  In review of his epic records, he  presented to the ED with nausea and vomiting 11/02/2021. CTAP at that time showed evidence of  acute on chronic Crohn's disease with multifocal bowel involvement and possible ileal stricture without high-grade  obstruction.  Outpatient GI evaluation was recommended and the patient was discharged home.  GI PROCEDURES:  Colonoscopy 01/10/2023 by Dr. Barron Alvine: - The entire examined colon is normal.  - Non-bleeding internal hemorrhoids.  - No specimens collected  Past Medical History:  Diagnosis Date   Arthritis    RA   Hip fracture (HCC)    Hypertension    Rheumatoid arthritis (HCC)     Past Surgical History:  Procedure Laterality Date   HYDRADENITIS EXCISION Bilateral 09/18/2018   Procedure: EXCISION BILATERAL AXILLARY HIDRADENITIS;  Surgeon: Harriette Bouillon, MD;  Location: [facility];  Service: General;  Laterality: Bilateral;   INCISION AND DRAINAGE PERIRECTAL ABSCESS N/A 07/17/2018   Procedure: IRRIGATION AND DEBRIDEMENT PERIRECTAL ABSCESS;  Surgeon: Axel Filler, MD;  Location: [facility] OR;  Service: General;  Laterality: N/A;    Prior to Admission medications   Medication Sig Start Date End Date Taking? Authorizing Provider  acetaminophen (TYLENOL) 325 MG tablet Take 650 mg by mouth every 6 (six) hours as needed for mild pain or moderate pain. Patient not taking: Reported on 12/28/2022    [provider]  amLODipine (NORVASC) 5 MG tablet Take 1 tablet (5 mg total) by mouth daily. 10/26/22   de [place], Buren Kos, MD  ciprofloxacin (CIPRO) 500 MG tablet Take 1 tablet (500 mg total) by mouth 2 (two) times daily. 03/23/23   Mesner, Barbara Cower, MD  dicyclomine (BENTYL) 20 MG tablet Take 1 tablet (20 mg total) by mouth 2 (two) times daily as needed for spasms (abdominal cramping). 03/23/23   Mesner, Barbara Cower, MD  docusate sodium (COLACE) 100 MG capsule Take 1 capsule (100 mg total) by mouth every 12 (twelve) hours. 03/23/23   Mesner, Barbara Cower, MD  ENBREL SURECLICK 50 MG/ML injection Inject 50 mg into the skin every Saturday.  06/15/18   [provider]  metroNIDAZOLE (FLAGYL) 500 MG tablet Take 1 tablet (500 mg total) by mouth 2 (two) times daily. 03/23/23   Mesner, Barbara Cower, MD   oxyCODONE-acetaminophen (PERCOCET) 5-325 MG tablet Take 2 tablets by mouth every 4 (four) hours as needed. 03/23/23   Mesner, Barbara Cower, MD  prednisoLONE acetate (PRED FORTE) 1 % ophthalmic suspension Place 1 drop into the right eye 4 (four) times daily. Patient not taking: Reported on 12/28/2022 11/08/22   [provider]  valsartan (DIOVAN) 80 MG tablet Take 1 tablet (80 mg total) by mouth daily. 10/26/22   de [place], Raymond J, MD    No current facility-administered medications for this encounter.    Allergies as of 03/30/2023 - Review Complete 03/30/2023  Allergen Reaction Noted   Peanut-containing drug products Itching and Rash 07/17/2018    Family History  Problem Relation Age of Onset   Heart failure Mother    Aneurysm Father    Colon polyps Neg Hx    Colon cancer Neg Hx    Esophageal cancer Neg Hx    Stomach cancer Neg Hx    Rectal cancer Neg Hx     Social History   Socioeconomic History   Marital status: Divorced    Spouse name: Not on file   Number of children: Not on file   Years of education: Not on file   Highest education level: Not on file  Occupational History   Not on file  Tobacco  Use   Smoking status: Some Days    Types: Cigars   Smokeless tobacco: Never  Vaping Use   Vaping status: Never Used  Substance and Sexual Activity   Alcohol use: Not Currently   Drug use: Not Currently    Frequency: 3.0 times per week   Sexual activity: Not on file  Other Topics Concern   Not on file  Social History Narrative   Not on file   Social Drivers of Health   Financial Resource Strain: Not on file  Food Insecurity: No Food Insecurity (03/30/2023)   Hunger Vital Sign    Worried About Running Out of Food in the Last Year: Never true    Ran Out of Food in the Last Year: Never true  Transportation Needs: No Transportation Needs (03/30/2023)   PRAPARE - Administrator, Civil Service (Medical): No    Lack of Transportation (Non-Medical): No   Physical Activity: Not on file  Stress: Not on file  Social Connections: Unknown (08/15/2021)   Received from [facility]   Social Network    Social Network: Not on file  Intimate Partner Violence: Not At Risk (03/30/2023)   Humiliation, Afraid, Rape, and Kick questionnaire    Fear of Current or Ex-Partner: No    Emotionally Abused: No    Physically Abused: No    Sexually Abused: No   Review of Systems: Gen: Denies fever, sweats or chills. No weight loss.  CV: Denies chest pain, palpitations or edema. Resp: Denies cough, shortness of breath of hemoptysis.  GI:See HPI.   GU : Denies urinary burning, blood in urine, increased urinary frequency or incontinence. MS: Denies joint pain, muscles aches or weakness. Derm: Denies rash, itchiness, skin lesions or unhealing ulcers. Psych: Denies depression, anxiety, memory loss or confusion. Heme: Denies easy bruising, bleeding. Neuro:  Denies headaches, dizziness or paresthesias. Endo:  Denies any problems with DM, thyroid or adrenal function.  Physical Exam: Vital signs in last 24 hours: Temp:  [98.4 F (36.9 C)-98.6 F (37 C)] 98.6 F (37 C) (12/27 1514) Pulse Rate:  [63-91] 74 (12/27 1514) Resp:  [16-22] 16 (12/27 1514) BP: (162-210)/(90-122) 197/95 (12/27 1514) SpO2:  [98 %-100 %] 99 % (12/27 1514) Weight:  [72 kg-73.3 kg] 73.3 kg (12/27 1514) Last BM Date : 03/30/23 General: Alert 52 year old male in no acute distress. Head:  Normocephalic and atraumatic. Eyes:  No scleral icterus. Conjunctiva pink. Ears:  Normal auditory acuity. Nose:  No deformity, discharge or lesions. Mouth:  Dentition intact. No ulcers or lesions.  Neck:  Supple. No lymphadenopathy or thyromegaly.  Lungs: Breath sounds clear throughout. No wheezes, rhonchi or crackles.  Heart: Regular rate and rhythm, no murmurs. Abdomen: Soft, nondistended.  Moderate tenderness to the RLQ without rebound or guarding.  Positive bowel sounds all 4  quadrants with intermittent brief gurgling noises.  No palpable mass.  No bruit. Rectal: Deferred. Musculoskeletal:  Symmetrical without gross deformities.  Pulses:  Normal pulses noted. Extremities:  Without clubbing or edema. Neurologic:  Alert and  oriented x 4. No focal deficits.  Skin:  Intact without significant lesions or rashes. Psych:  Alert and cooperative. Normal mood and affect.  Intake/Output from previous day: No intake/output data recorded. Intake/Output this shift: Total I/O In: 1000 [IV Piggyback:1000] Out: -   Lab Results: Recent Labs    03/30/23 1125  WBC 10.2  HGB 13.5  HCT 40.9  PLT 428*   BMET Recent Labs    03/30/23  1125  NA 138  K 3.6  CL 102  CO2 26  GLUCOSE 85  BUN 11  CREATININE 1.03  CALCIUM 9.3   LFT Recent Labs    03/30/23 1125  PROT 8.4*  ALBUMIN 4.0  AST 15  ALT 9  ALKPHOS 109  BILITOT 0.5    Studies/Results: CT ABDOMEN PELVIS W CONTRAST Result Date: 03/30/2023 CLINICAL DATA:  Right lower quadrant abdominal pain. Recently diagnosed with inflammatory bowel disease. EXAM: CT ABDOMEN AND PELVIS WITH CONTRAST TECHNIQUE: Multidetector CT imaging of the abdomen and pelvis was performed using the standard protocol following bolus administration of intravenous contrast. RADIATION DOSE REDUCTION: This exam was performed according to the departmental dose-optimization program which includes automated exposure control, adjustment of the mA and/or kV according to patient size and/or use of iterative reconstruction technique. CONTRAST:  OMNIPAQUE IOHEXOL 300 MG/ML  SOLN COMPARISON:  CT abdomen pelvis dated March 23, 2023. FINDINGS: Lower chest: No acute abnormality. Hepatobiliary: No focal liver abnormality is seen. No gallstones, gallbladder wall thickening, or biliary dilatation. Pancreas: Unremarkable. No pancreatic ductal dilatation or surrounding inflammatory changes. Spleen: Normal in size without focal abnormality.  Adrenals/Urinary Tract: Adrenal glands are unremarkable. Unchanged bilateral renal cortical scarring. Unchanged bilateral renal simple cysts measuring up to 5 4 cm. No follow-up imaging is recommended. Unchanged punctate calculus in the upper pole of the left kidney. No hydronephrosis. Stomach/Bowel: The stomach is within normal limits. Multiple skip areas of bowel wall thickening again noted. Wall thickening of the duodenum and proximal jejunum has mildly improved. Continued severe wall thickening of the terminal ileum. Progressive asymmetric irregularity and indistinct enhancing soft tissue density along the right lateral aspect of the terminal ileum at the ventral mesentery with two new tiny foci of extraluminal air (series 2, image 63; series 5, image 60). No drainable fluid collection. New focal area of wall thickening in the distal ileum (series 2, image 56) with dilated small bowel loops and fecalization of bowel contents proximal to this area. Vascular/Lymphatic: Aortic atherosclerosis. No enlarged abdominal or pelvic lymph nodes. Reproductive: Prostate is unremarkable. Other: Trace free fluid in the pelvis.  No pneumoperitoneum. Musculoskeletal: No acute or significant osseous findings. Chronic sacroiliitis again noted. IMPRESSION: 1. Sequelae of inflammatory bowel disease again noted with progressive asymmetric irregularity and indistinct enhancing soft tissue density along the right lateral aspect of the terminal ileum at the ventral mesentery, concerning for worsening penetrating disease with developing sinus tract/fistula. No abscess or pneumoperitoneum. 2. New focal area of wall thickening in the distal ileum with dilated small bowel loops and fecalization of bowel contents proximal to this area, concerning for partial small bowel obstruction. 3. Punctate nonobstructive left nephrolithiasis. 4. Chronic sacroiliitis. 5.  Aortic Atherosclerosis (ICD10-I70.0). Electronically Signed   By: Obie Dredge  M.D.   On: 03/30/2023 12:52    IMPRESSION/PLAN:  52 year old male with N/V and diarrhea. CTAP showed progressive asymmetric irregularity and indistinct enhancing soft tissue density along the right lateral aspect of the terminal ileum concerning for worsening penetrating disease with developing sinus tract/fistula without abscess and a new focal area of wall thickening in the distal ileum with dilated small bowel loops and fecalization of bowel contents proximal to this area, concerning for a partial small bowel obstruction.  Normal colonoscopy 01/2023.  He was seen by general surgery, recommended conservative management, no indication for surgery at this time. -NPO -IV fluids and pain management per the hospitalist -NG tube deferred for now, may need to place NG tube if vomiting  recurs -Consider Solumedrol IV, defer further recommendations to Dr. Russella Dar -Continue Zosyn IV -Ondansetron 4 mg p.o. or IV every 6 hours as needed -No plans for endoscopic evaluation at this time -Abdominal xray in am -CBC, CMP, CRP in am -No NSAIDs  Rheumatoid arthritis on Enbrel injections weekly  [name]  03/30/2023, 4:16 PM    Attending Physician Note   I have taken a history, reviewed the chart and examined the patient. I performed a substantive portion of this encounter, including complete performance of at least one of the key components, in conjunction with the APP. I agree with the APP's note, impression and recommendations with my edits. My additional impressions and recommendations are as follows.   52 year old male hospitalized with acute RLQ pain, nausea, vomiting, mild diarrhea.  He has imaging findings since CT AP in April 2020 that show terminal ileal abnormalities concerning for Crohn's disease. Subsequent CT scans in August 2023 and March 23, 2023 show segmental duodenal, jejunal, distal ileal and terminal ileal abnormalities concerning for small bowel Crohn's disease.  CT AP  today shows progressive asymmetric irregular and indistinct enhancing soft tissue density along the terminal ileum concerning for worsening penetrating disease with sinus tract/fistula development.  No abscess or pneumoperitoneum.  New focal area of wall thickening in the distal ileum with dilated loops of small bowel proximal to this area concerning for partial small bowel obstruction.  He has been maintained on Enbrel for RA and has a history of hydradenitis.  He was previously been treated with Humira and Rinvoq which were not as effective as Enbrel which he has been on for about the past 1.5 years.  Colonoscopy by Dr. Barron Alvine in October 2024 was normal to the cecum except for hemorrhoids.  The TI was not examined and biopsies were not obtained.   Crohn's disease, small bowel, with partial SBO likely inflammation related.  IV Zosyn, clear liquids, add IV Solu-Medrol after 1-2 days.  Consider colonoscopy with TI intubation after acute symptoms are well controlled, likely as outpatient.  Discuss biologic medication change in coordination with his rheumatologist next week.  Follow CBC, CMP, CRP.  Outpatient follow-up with Dr. Barron Alvine.   Claudette Head, MD [facility] See [facility], [facility], for our on call provider

## 2023-03-31 DIAGNOSIS — K50012 Crohn's disease of small intestine with intestinal obstruction: Secondary | ICD-10-CM | POA: Diagnosis not present

## 2023-03-31 LAB — C DIFFICILE QUICK SCREEN W PCR REFLEX
C Diff antigen: NEGATIVE
C Diff interpretation: NOT DETECTED
C Diff toxin: NEGATIVE

## 2023-03-31 LAB — GASTROINTESTINAL PANEL BY PCR, STOOL (REPLACES STOOL CULTURE)

## 2023-03-31 LAB — POTASSIUM: Potassium: 3.5 mmol/L (ref 3.5–5.1)

## 2023-03-31 LAB — CREATININE, SERUM
Creatinine, Ser: 0.93 mg/dL (ref 0.61–1.24)
GFR, Estimated: >60 mL/min (ref >60)

## 2023-03-31 LAB — HEPATITIS B CORE ANTIBODY, TOTAL: Hep B Core Total Ab: NONREACTIVE

## 2023-03-31 LAB — CBC
HCT: 35.7 % — ABNORMAL LOW (ref 39.0–52.0)
Hemoglobin: 11.5 g/dL — ABNORMAL LOW (ref 13.0–17.0)
MCH: 29.3 pg (ref 26.0–34.0)
MCHC: 32.2 g/dL (ref 30.0–36.0)
MCV: 91.1 fL (ref 80.0–100.0)
Platelets: 385 10*3/uL (ref 150–400)
RBC: 3.92 MIL/uL — ABNORMAL LOW (ref 4.22–5.81)
RDW: 14.8 % (ref 11.5–15.5)
WBC: 7 10*3/uL (ref 4.0–10.5)
nRBC: 0 % (ref 0.0–0.2)

## 2023-03-31 LAB — HEPATITIS B SURFACE ANTIBODY,QUALITATIVE: Hep B S Ab: NONREACTIVE

## 2023-03-31 LAB — HEPATITIS C ANTIBODY: HCV Ab: NONREACTIVE

## 2023-03-31 LAB — HEPATITIS B SURFACE ANTIGEN: Hepatitis B Surface Ag: NONREACTIVE

## 2023-03-31 MED ORDER — METHYLPREDNISOLONE SODIUM SUCC 40 MG IJ SOLR
30.0000 mg | Freq: Two times a day (BID) | INTRAMUSCULAR | Status: DC
  Administered 2023-03-31 – 2023-04-02 (×5): 30 mg via INTRAVENOUS
  Filled 2023-03-31 (×5): qty 1

## 2023-03-31 NOTE — Progress Notes (Signed)
03/31/2023  Mark [name] [id number] 01-12-1971  CARE TEAM: PCP: de [place], Raymond J, MD  Outpatient Care Team: Patient Care Team: de [place], Buren Kos, MD as PCP - General (Family Medicine) Shellia Cleverly, DO as Consulting Physician (Gastroenterology) Tarri Glenn, Johnathan Hausen, PA-C as Physician Assistant (Rheumatology) Harriette Bouillon, MD as Consulting Physician (General Surgery)  Inpatient Treatment Team: Treatment Team:  Noralee Stain, DO Meryl Dare, MD [facility], Md, MD Lilyan Gilford, MD Diona Browner, RN Clydia Llano, RN   Problem List:   Principal Problem:   Crohn's ileitis, with intestinal obstruction [facility]) Active Problems:   Inflammatory bowel disease   Immunosuppression due to drug therapy [facility])   * No surgery found *      Assessment [place] [facility] Stay = 1 days)      Stabilizing.    Plan:  CAT scan pathognomonic for Crohn's disease with ileal skip lesions and significant terminal ileal thickening.  Patient without abdominal pain today on IV antibiotics.  Seen by Gastroenterology.  Agree with IV steroids for now.  Solu-Medrol.   Hopefully can transition to a biologic medication.  Agree with coordination between Gastroenterology and rheumatology to find such a medication.  Patient recalls Cristy Folks being mentioned.  Defer to Dr. Barron Alvine and neurology team.  Molli Knock to try advancing diet gradually from a surgery standpoint.  No acute indication for surgical intervention such as Hartmann resection ileostomy.  If does not improve or worsens by hospital day #5, consider repeat CAT scan to rule out worsening progression.  Seems less likely now.  Surgery to follow-up on Monday.  Call if questions  VTE prophylaxis- SCDs, etc  mobilize as tolerated to help recovery  -Disposition: TBD D/C patient from hospital when patient meets criteria (anticipate in 2-3 day(s)):  Tolerating oral intake well Ambulating well Adequate pain control  without IV medications Urinating  Having flatus Disposition planning in place       I reviewed nursing notes, Consultant GI notes, hospitalist notes, last 24 h vitals and pain scores, last 48 h intake and output, last 24 h labs and trends, and last 24 h imaging results.  I have reviewed this patient's available data, including medical history, events of note, test results, etc as part of my evaluation.   A significant portion of that time was spent in counseling. Care during the described time interval was provided by me.  This care required moderate level of medical decision making.  03/31/2023    Subjective: (Chief complaint)  Patient feeling much better overall.  Began to have some bowel wounds.  No nausea or vomiting.  Tolerated clear liquids.  Wondering if he can advance his diet.  Staying in bed for now  Objective:  Vital signs:  Vitals:   03/30/23 1818 03/30/23 2004 03/31/23 0226 03/31/23 0558  BP: (!) 174/93 (!) 160/96 (!) 149/89 (!) 151/90  Pulse: 68 71 75 64  Resp: 18 18 16 18   Temp: 98.3 F (36.8 C)  98.5 F (36.9 C) 98.9 F (37.2 C)  TempSrc:   Oral Oral  SpO2: 100% 97% 97% 99%  Weight:      Height:        Last BM Date : 03/30/23  Intake/Output   Yesterday:  12/27 0701 - 12/28 0700 In: 4384.6 [P.O.:720; I.V.:1564.6; IV Piggyback:2100] Out: 1400 [Urine:1400] This shift:  No intake/output data recorded.  Bowel function:  Flatus: YES  BM:  YES  Drain: (No drain)   Physical Exam:  General: Pt awake/alert in no  acute distress Eyes: PERRL, normal EOM.  Sclera clear.  No icterus Neuro: CN II-XII intact w/o focal sensory/motor deficits. Lymph: No head/neck/groin lymphadenopathy Psych:  No delerium/psychosis/paranoia.  Oriented x 4 HENT: Normocephalic, Mucus membranes moist.  No thrush Neck: Supple, No tracheal deviation.  No obvious thyromegaly Chest: No pain to chest wall compression.  Good respiratory excursion.  No audible  wheezing CV:  Pulses intact.  Regular rhythm.  No major extremity edema MS: Normal AROM mjr joints.  No obvious deformity  Abdomen: Soft.  Nondistended.  Tenderness at right lower quadrant rather mild.  No guarding or rebound tenderness. .  No evidence of peritonitis.  No incarcerated hernias.  Ext:   No deformity.  No mjr edema.  No cyanosis Skin: No petechiae / purpurea.  No major sores.  Warm and dry    Results:   Cultures: Recent Results (from the past 720 hours)  C Difficile Quick Screen w PCR reflex     Status: None   Collection Time: 03/30/23  3:55 PM   Specimen: STOOL  Result Value Ref Range Status   C Diff antigen NEGATIVE NEGATIVE Final   C Diff toxin NEGATIVE NEGATIVE Final   C Diff interpretation No C. difficile detected.  Final    Comment: Performed at [facility], [address] [place], [place] [address]    Labs: Results for orders placed or performed during the hospital encounter of 03/30/23 (from the past 48 hours)  CBC with Differential     Status: Abnormal   Collection Time: 03/30/23 11:25 AM  Result Value Ref Range   WBC 10.2 4.0 - 10.5 K/uL   RBC 4.61 4.22 - 5.81 MIL/uL   Hemoglobin 13.5 13.0 - 17.0 g/dL   HCT 60.4 54.0 - 98.1 %   MCV 88.7 80.0 - 100.0 fL   MCH 29.3 26.0 - 34.0 pg   MCHC 33.0 30.0 - 36.0 g/dL   RDW 19.1 47.8 - 29.5 %   Platelets 428 (H) 150 - 400 K/uL   nRBC 0.0 0.0 - 0.2 %   Neutrophils Relative % 71 %   Neutro Abs 7.3 1.7 - 7.7 K/uL   Lymphocytes Relative 19 %   Lymphs Abs 1.9 0.7 - 4.0 K/uL   Monocytes Relative 7 %   Monocytes Absolute 0.7 0.1 - 1.0 K/uL   Eosinophils Relative 2 %   Eosinophils Absolute 0.2 0.0 - 0.5 K/uL   Basophils Relative 1 %   Basophils Absolute 0.1 0.0 - 0.1 K/uL   Immature Granulocytes 0 %   Abs Immature Granulocytes 0.02 0.00 - 0.07 K/uL    Comment: Performed at [facility], [address], [place], [place] [address]  Comprehensive metabolic panel      Status: Abnormal   Collection Time: 03/30/23 11:25 AM  Result Value Ref Range   Sodium 138 135 - 145 mmol/L   Potassium 3.6 3.5 - 5.1 mmol/L   Chloride 102 98 - 111 mmol/L   CO2 26 22 - 32 mmol/L   Glucose, Bld 85 70 - 99 mg/dL    Comment: Glucose reference range applies only to samples taken after fasting for at least 8 hours.   BUN 11 6 - 20 mg/dL   Creatinine, Ser 8.65 0.61 - 1.24 mg/dL   Calcium 9.3 8.9 - 78.4 mg/dL   Total Protein 8.4 (H) 6.5 - 8.1 g/dL   Albumin 4.0 3.5 - 5.0 g/dL   AST 15 15 - 41 U/L   ALT  9 0 - 44 U/L   Alkaline Phosphatase 109 38 - 126 U/L   Total Bilirubin 0.5 <1.2 mg/dL   GFR, Estimated >78 >29 mL/min    Comment: (NOTE) Calculated using the CKD-EPI Creatinine Equation (2021)    Anion gap 10 5 - 15    Comment: Performed at [facility], [address], [place], [place] [address]  Lipase, blood     Status: None   Collection Time: 03/30/23 11:25 AM  Result Value Ref Range   Lipase 12 11 - 51 U/L    Comment: Performed at [facility], [address] [place], [place] [address]  Urinalysis, Routine w reflex microscopic -Urine, Clean Catch     Status: Abnormal   Collection Time: 03/30/23 11:25 AM  Result Value Ref Range   Color, Urine YELLOW YELLOW   APPearance CLEAR CLEAR   Specific Gravity, Urine 1.046 (H) 1.005 - 1.030   pH 7.0 5.0 - 8.0   Glucose, UA NEGATIVE NEGATIVE mg/dL   Hgb urine dipstick NEGATIVE NEGATIVE   Bilirubin Urine NEGATIVE NEGATIVE   Ketones, ur 15 (A) NEGATIVE mg/dL   Protein, ur TRACE (A) NEGATIVE mg/dL   Nitrite NEGATIVE NEGATIVE   Leukocytes,Ua NEGATIVE NEGATIVE    Comment: Performed at [facility], [address], [place], [place] [address]  Magnesium     Status: None   Collection Time: 03/30/23 11:37 AM  Result Value Ref Range   Magnesium 1.9 1.7 - 2.4 mg/dL    Comment: Performed at [facility], [address] [place], [place] [address]   Phosphorus     Status: Abnormal   Collection Time: 03/30/23 11:37 AM  Result Value Ref Range   Phosphorus 2.4 (L) 2.5 - 4.6 mg/dL    Comment: Performed at [facility], [address] [place], [place] [address]  C Difficile Quick Screen w PCR reflex     Status: None   Collection Time: 03/30/23  3:55 PM   Specimen: STOOL  Result Value Ref Range   C Diff antigen NEGATIVE NEGATIVE   C Diff toxin NEGATIVE NEGATIVE   C Diff interpretation No C. difficile detected.     Comment: Performed at [facility], [address] [place], [place] [address]  Sedimentation rate     Status: Abnormal   Collection Time: 03/30/23  4:13 PM  Result Value Ref Range   Sed Rate 59 (H) 0 - 16 mm/hr    Comment: Performed at [facility], [address], [place], [place] [address]  C-reactive protein     Status: None   Collection Time: 03/30/23  4:13 PM  Result Value Ref Range   CRP 0.7 <1.0 mg/dL    Comment: Performed at [facility], [address], [place], [place] [address]  CBC     Status: Abnormal   Collection Time: 03/31/23  5:23 AM  Result Value Ref Range   WBC 7.0 4.0 - 10.5 K/uL   RBC 3.92 (L) 4.22 - 5.81 MIL/uL   Hemoglobin 11.5 (L) 13.0 - 17.0 g/dL   HCT 47.4 (L) 25.9 - 56.3 %   MCV 91.1 80.0 - 100.0 fL   MCH 29.3 26.0 - 34.0 pg   MCHC 32.2 30.0 - 36.0 g/dL   RDW 87.5 64.3 - 32.9 %   Platelets 385 150 - 400 K/uL   nRBC 0.0 0.0 - 0.2 %    Comment: Performed at [facility], [address] [place], [place] [address]  Potassium     Status: None   Collection Time: 03/31/23  5:23 AM  Result Value Ref Range   Potassium 3.5 3.5 - 5.1 mmol/L    Comment: Performed at [facility], [address] [place], [place] [address]  Creatinine, serum     Status: None   Collection Time: 03/31/23  5:23 AM  Result Value Ref Range   Creatinine, Ser 0.93 0.61 - 1.24 mg/dL   GFR, Estimated >82 >95 mL/min     Comment: (NOTE) Calculated using the CKD-EPI Creatinine Equation (2021) Performed at [facility], [address] [place], [place] [address]     Imaging / Studies: CT ABDOMEN PELVIS W CONTRAST Result Date: 03/30/2023 CLINICAL DATA:  Right lower quadrant abdominal pain. Recently diagnosed with inflammatory bowel disease. EXAM: CT ABDOMEN AND PELVIS WITH CONTRAST TECHNIQUE: Multidetector CT imaging of the abdomen and pelvis was performed using the standard protocol following bolus administration of intravenous contrast. RADIATION DOSE REDUCTION: This exam was performed according to the departmental dose-optimization program which includes automated exposure control, adjustment of the mA and/or kV according to patient size and/or use of iterative reconstruction technique. CONTRAST:  OMNIPAQUE IOHEXOL 300 MG/ML  SOLN COMPARISON:  CT abdomen pelvis dated March 23, 2023. FINDINGS: Lower chest: No acute abnormality. Hepatobiliary: No focal liver abnormality is seen. No gallstones, gallbladder wall thickening, or biliary dilatation. Pancreas: Unremarkable. No pancreatic ductal dilatation or surrounding inflammatory changes. Spleen: Normal in size without focal abnormality. Adrenals/Urinary Tract: Adrenal glands are unremarkable. Unchanged bilateral renal cortical scarring. Unchanged bilateral renal simple cysts measuring up to 5 4 cm. No follow-up imaging is recommended. Unchanged punctate calculus in the upper pole of the left kidney. No hydronephrosis. Stomach/Bowel: The stomach is within normal limits. Multiple skip areas of bowel wall thickening again noted. Wall thickening of the duodenum and proximal jejunum has mildly improved. Continued severe wall thickening of the terminal ileum. Progressive asymmetric irregularity and indistinct enhancing soft tissue density along the right lateral aspect of the terminal ileum at the ventral mesentery with two new tiny foci of extraluminal  air (series 2, image 63; series 5, image 60). No drainable fluid collection. New focal area of wall thickening in the distal ileum (series 2, image 56) with dilated small bowel loops and fecalization of bowel contents proximal to this area. Vascular/Lymphatic: Aortic atherosclerosis. No enlarged abdominal or pelvic lymph nodes. Reproductive: Prostate is unremarkable. Other: Trace free fluid in the pelvis.  No pneumoperitoneum. Musculoskeletal: No acute or significant osseous findings. Chronic sacroiliitis again noted. IMPRESSION: 1. Sequelae of inflammatory bowel disease again noted with progressive asymmetric irregularity and indistinct enhancing soft tissue density along the right lateral aspect of the terminal ileum at the ventral mesentery, concerning for worsening penetrating disease with developing sinus tract/fistula. No abscess or pneumoperitoneum. 2. New focal area of wall thickening in the distal ileum with dilated small bowel loops and fecalization of bowel contents proximal to this area, concerning for partial small bowel obstruction. 3. Punctate nonobstructive left nephrolithiasis. 4. Chronic sacroiliitis. 5.  Aortic Atherosclerosis (ICD10-I70.0). Electronically Signed   By: Obie Dredge M.D.   On: 03/30/2023 12:52    Medications / Allergies: per chart  Antibiotics: Anti-infectives (From admission, onward)    Start     Dose/Rate Route Frequency Ordered Stop   03/30/23 1800  metroNIDAZOLE (FLAGYL) IVPB 500 mg  Status:  Discontinued        500 mg 100 mL/hr over 60 Minutes  Intravenous Every 12 hours 03/30/23 1559 03/30/23 1614   03/30/23 1700  cefTRIAXone (ROCEPHIN) 2 g in sodium chloride 0.9 % 100 mL IVPB  Status:  Discontinued       Note to Pharmacy: Pharmacy may adjust dosing strength / duration / interval for maximal efficacy   2 g 200 mL/hr over 30 Minutes Intravenous Daily 03/30/23 1559 03/30/23 1614   03/30/23 1700  piperacillin-tazobactam (ZOSYN) IVPB 3.375 g        3.375  g 12.5 mL/hr over 240 Minutes Intravenous Every 8 hours 03/30/23 1614 04/04/23 1359         Note: Portions of this report may have been transcribed using voice recognition software. Every effort was made to ensure accuracy; however, inadvertent computerized transcription errors may be present.   Any transcriptional errors that result from this process are unintentional.    Ardeth Sportsman, MD, FACS, MASCRS Esophageal, Gastrointestinal & Colorectal Surgery Robotic and Minimally Invasive Surgery  [facility] [address] [place], [place] [address] [phone] Fax [phone] Main  CONTACT INFORMATION: Weekday (9AM-5PM): Call [facility] main office at [phone] Weeknight (5PM-9AM) or Weekend/Holiday: Check EPIC "Web Links" tab & use "AMION" (password " [email or web address]") for General Surgery [facility] coverage  Please, DO NOT use SecureChat  (it is not reliable communication to reach operating surgeons & will lead to a delay in care).   Epic staff messaging available for outptient concerns needing 1-2 business day response.      03/31/2023  8:54 AM

## 2023-03-31 NOTE — Plan of Care (Signed)
?  Problem: Clinical Measurements: ?Goal: Will remain free from infection ?Outcome: Progressing ?  ?

## 2023-03-31 NOTE — Plan of Care (Signed)
  Problem: Education: Goal: Knowledge of General Education information will improve Description: Including pain rating scale, medication(s)/side effects and non-pharmacologic comfort measures Outcome: Progressing   Problem: Activity: Goal: Risk for activity intolerance will decrease Outcome: Progressing   

## 2023-03-31 NOTE — Progress Notes (Signed)
PROGRESS NOTE    Mark [name]  WUJ:[id number] DOB: 07-11-1970 DOA: 03/30/2023 PCP: de [place], Raymond J, MD     Brief Narrative:  Mark [name] is a 52 y.o. male with medical history significant of rheumatoid arthritis on Enbrel, hypertension, hidradenitis suppurativa who presents outside hospital with abdominal pain, nausea and vomiting. CT abdomen pelvis was obtained which demonstrated evidence of inflammatory bowel disease with worsening thickening around the terminal ileum and concern for developing penetrating Crohn's disease.  There is possibility of a developing small bowel obstruction so patient was transferred to [facility] for further assessment.  Surgery and GI was consulted.    New events last 24 hours / Subjective: Patient feeling much better this morning.  Has some minimal right lower quadrant discomfort but no pain.  No nausea or vomiting.  Assessment & Plan:   Principal Problem:   Crohn's ileitis, with intestinal obstruction (HCC) Active Problems:   Inflammatory bowel disease   Immunosuppression due to drug therapy (HCC)   Crohn's ileitis with pSBO  -Advance to full liquid diet today -Zosyn -IV Solu-Medrol -GI and general surgery following  Rheumatoid arthritis -Enbrel PTA  Hypertension -Amlodipine, ARB   DVT prophylaxis:  enoxaparin (LOVENOX) injection 40 mg Start: 03/30/23 1800 SCDs Start: 03/30/23 1642  Code Status: Full code Family Communication: None at bedside Disposition Plan: Home Status is: Inpatient Remains inpatient appropriate because: IV antibiotics    Antimicrobials:  Anti-infectives (From admission, onward)    Start     Dose/Rate Route Frequency Ordered Stop   03/30/23 1800  metroNIDAZOLE (FLAGYL) IVPB 500 mg  Status:  Discontinued        500 mg 100 mL/hr over 60 Minutes Intravenous Every 12 hours 03/30/23 1559 03/30/23 1614   03/30/23 1700  cefTRIAXone (ROCEPHIN) 2 g in sodium chloride 0.9 % 100 mL IVPB  Status:   Discontinued       Note to Pharmacy: Pharmacy may adjust dosing strength / duration / interval for maximal efficacy   2 g 200 mL/hr over 30 Minutes Intravenous Daily 03/30/23 1559 03/30/23 1614   03/30/23 1700  piperacillin-tazobactam (ZOSYN) IVPB 3.375 g        3.375 g 12.5 mL/hr over 240 Minutes Intravenous Every 8 hours 03/30/23 1614 04/04/23 1359        Objective: Vitals:   03/30/23 2004 03/31/23 0226 03/31/23 0558 03/31/23 1010  BP: (!) 160/96 (!) 149/89 (!) 151/90 (!) 167/93  Pulse: 71 75 64 70  Resp: 18 16 18    Temp:  98.5 F (36.9 C) 98.9 F (37.2 C) 98.8 F (37.1 C)  TempSrc:  Oral Oral Oral  SpO2: 97% 97% 99% 100%  Weight:      Height:        Intake/Output Summary (Last 24 hours) at 03/31/2023 1207 Last data filed at 03/31/2023 1000 Gross per 24 hour  Intake 4744.56 ml  Output 1750 ml  Net 2994.56 ml   Filed Weights   03/30/23 1014 03/30/23 1514  Weight: 72 kg 73.3 kg    Examination:  General exam: Appears calm and comfortable  Respiratory system: Clear to auscultation. Respiratory effort normal. No respiratory distress. No conversational dyspnea.  Cardiovascular system: S1 & S2 heard, RRR. No murmurs. No pedal edema. Gastrointestinal system: Abdomen is nondistended, soft and nontender. Normal bowel sounds heard. Central nervous system: Alert and oriented. No focal neurological deficits. Speech clear.  Extremities: Symmetric in appearance  Skin: No rashes, lesions or ulcers on exposed skin  Psychiatry: Judgement and insight appear  normal. Mood & affect appropriate.   Data Reviewed: I have personally reviewed following labs and imaging studies  CBC: Recent Labs  Lab 03/30/23 1125 03/31/23 0523  WBC 10.2 7.0  NEUTROABS 7.3  --   HGB 13.5 11.5*  HCT 40.9 35.7*  MCV 88.7 91.1  PLT 428* 385   Basic Metabolic Panel: Recent Labs  Lab 03/30/23 1125 03/30/23 1137 03/31/23 0523  NA 138  --   --   K 3.6  --  3.5  CL 102  --   --   CO2 26  --    --   GLUCOSE 85  --   --   BUN 11  --   --   CREATININE 1.03  --  0.93  CALCIUM 9.3  --   --   MG  --  1.9  --   PHOS  --  2.4*  --    GFR: Estimated Creatinine Clearance: 94.8 mL/min (by C-G formula based on SCr of 0.93 mg/dL). Liver Function Tests: Recent Labs  Lab 03/30/23 1125  AST 15  ALT 9  ALKPHOS 109  BILITOT 0.5  PROT 8.4*  ALBUMIN 4.0   Recent Labs  Lab 03/30/23 1125  LIPASE 12   No results for input(s): "AMMONIA" in the last 168 hours. Coagulation Profile: No results for input(s): "INR", "PROTIME" in the last 168 hours. Cardiac Enzymes: No results for input(s): "CKTOTAL", "CKMB", "CKMBINDEX", "TROPONINI" in the last 168 hours. BNP (last 3 results) No results for input(s): "PROBNP" in the last 8760 hours. HbA1C: No results for input(s): "HGBA1C" in the last 72 hours. CBG: No results for input(s): "GLUCAP" in the last 168 hours. Lipid Profile: No results for input(s): "CHOL", "HDL", "LDLCALC", "TRIG", "CHOLHDL", "LDLDIRECT" in the last 72 hours. Thyroid Function Tests: No results for input(s): "TSH", "T4TOTAL", "FREET4", "T3FREE", "THYROIDAB" in the last 72 hours. Anemia Panel: No results for input(s): "VITAMINB12", "FOLATE", "FERRITIN", "TIBC", "IRON", "RETICCTPCT" in the last 72 hours. Sepsis Labs: No results for input(s): "PROCALCITON", "LATICACIDVEN" in the last 168 hours.  Recent Results (from the past 240 hours)  C Difficile Quick Screen w PCR reflex     Status: None   Collection Time: 03/30/23  3:55 PM   Specimen: STOOL  Result Value Ref Range Status   C Diff antigen NEGATIVE NEGATIVE Final   C Diff toxin NEGATIVE NEGATIVE Final   C Diff interpretation No C. difficile detected.  Final    Comment: Performed at [facility], [address], [place], [place] [address]      Radiology Studies: CT ABDOMEN PELVIS W CONTRAST Result Date: 03/30/2023 CLINICAL DATA:  Right lower quadrant abdominal pain. Recently diagnosed with  inflammatory bowel disease. EXAM: CT ABDOMEN AND PELVIS WITH CONTRAST TECHNIQUE: Multidetector CT imaging of the abdomen and pelvis was performed using the standard protocol following bolus administration of intravenous contrast. RADIATION DOSE REDUCTION: This exam was performed according to the departmental dose-optimization program which includes automated exposure control, adjustment of the mA and/or kV according to patient size and/or use of iterative reconstruction technique. CONTRAST:  OMNIPAQUE IOHEXOL 300 MG/ML  SOLN COMPARISON:  CT abdomen pelvis dated March 23, 2023. FINDINGS: Lower chest: No acute abnormality. Hepatobiliary: No focal liver abnormality is seen. No gallstones, gallbladder wall thickening, or biliary dilatation. Pancreas: Unremarkable. No pancreatic ductal dilatation or surrounding inflammatory changes. Spleen: Normal in size without focal abnormality. Adrenals/Urinary Tract: Adrenal glands are unremarkable. Unchanged bilateral renal cortical scarring. Unchanged bilateral renal simple cysts measuring  up to 5 4 cm. No follow-up imaging is recommended. Unchanged punctate calculus in the upper pole of the left kidney. No hydronephrosis. Stomach/Bowel: The stomach is within normal limits. Multiple skip areas of bowel wall thickening again noted. Wall thickening of the duodenum and proximal jejunum has mildly improved. Continued severe wall thickening of the terminal ileum. Progressive asymmetric irregularity and indistinct enhancing soft tissue density along the right lateral aspect of the terminal ileum at the ventral mesentery with two new tiny foci of extraluminal air (series 2, image 63; series 5, image 60). No drainable fluid collection. New focal area of wall thickening in the distal ileum (series 2, image 56) with dilated small bowel loops and fecalization of bowel contents proximal to this area. Vascular/Lymphatic: Aortic atherosclerosis. No enlarged abdominal or pelvic lymph  nodes. Reproductive: Prostate is unremarkable. Other: Trace free fluid in the pelvis.  No pneumoperitoneum. Musculoskeletal: No acute or significant osseous findings. Chronic sacroiliitis again noted. IMPRESSION: 1. Sequelae of inflammatory bowel disease again noted with progressive asymmetric irregularity and indistinct enhancing soft tissue density along the right lateral aspect of the terminal ileum at the ventral mesentery, concerning for worsening penetrating disease with developing sinus tract/fistula. No abscess or pneumoperitoneum. 2. New focal area of wall thickening in the distal ileum with dilated small bowel loops and fecalization of bowel contents proximal to this area, concerning for partial small bowel obstruction. 3. Punctate nonobstructive left nephrolithiasis. 4. Chronic sacroiliitis. 5.  Aortic Atherosclerosis (ICD10-I70.0). Electronically Signed   By: Obie Dredge M.D.   On: 03/30/2023 12:52      Scheduled Meds:  amLODipine  5 mg Oral Daily   enoxaparin (LOVENOX) injection  40 mg Subcutaneous Daily   methylPREDNISolone (SOLU-MEDROL) injection  30 mg Intravenous Q12H   Continuous Infusions:  lactated ringers 125 mL/hr at 03/31/23 0906   piperacillin-tazobactam (ZOSYN)  IV 3.375 g (03/31/23 0604)     LOS: 1 day   Time spent: 25 minutes   Noralee Stain, DO [facility] 03/31/2023, 12:07 PM   Available via Epic secure chat 7am-7pm After these hours, please refer to coverage provider listed on [email or web address]

## 2023-03-31 NOTE — Progress Notes (Signed)
Progress Note   Assessment    Crohn's disease, small bowel, with resolving partial SBO likely inflammation related   RA on Enbrel Hydradenitis    Recommendations   IV Zosyn, IV Solu-Medrol Advance to full liquids today Colonoscopy to evaluate TI as outpatient after acute symptoms are  controlled  Trend CBC, CMP, ESR   Discuss biologic medication change in coordination with his rheumatologist next week.  Outpatient follow-up with Dr. Barron Alvine   Chief Complaint   RLQ pain improved, ESR=59, CRP=0.7  Vital signs in last 24 hours: Temp:  [98.3 F (36.8 C)-98.9 F (37.2 C)] 98.9 F (37.2 C) (12/28 0558) Pulse Rate:  [63-91] 64 (12/28 0558) Resp:  [16-22] 18 (12/28 0558) BP: (149-210)/(89-122) 151/90 (12/28 0558) SpO2:  [97 %-100 %] 99 % (12/28 0558) Weight:  [72 kg-73.3 kg] 73.3 kg (12/27 1514) Last BM Date : 03/30/23  General: Alert, well-developed, in NAD Heart:  Regular rate and rhythm; no murmurs Chest: Clear to ascultation bilaterally Abdomen:  Soft, RLQ tenderness and nondistended. Normal bowel sounds, without guarding, and without rebound.   Extremities:  Without edema. Neurologic:  Alert and  oriented x4; grossly normal neurologically. Psych:  Alert and cooperative. Normal mood and affect.  Intake/Output from previous day: 12/27 0701 - 12/28 0700 In: 4384.6 [P.O.:720; I.V.:1564.6; IV Piggyback:2100] Out: 1400 [Urine:1400] Intake/Output this shift: Total I/O In: -  Out: 350 [Urine:350]  Lab Results: Recent Labs    03/30/23 1125 03/31/23 0523  WBC 10.2 7.0  HGB 13.5 11.5*  HCT 40.9 35.7*  PLT 428* 385   BMET Recent Labs    03/30/23 1125 03/31/23 0523  NA 138  --   K 3.6 3.5  CL 102  --   CO2 26  --   GLUCOSE 85  --   BUN 11  --   CREATININE 1.03 0.93  CALCIUM 9.3  --    LFT Recent Labs    03/30/23 1125  PROT 8.4*  ALBUMIN 4.0  AST 15  ALT 9  ALKPHOS 109  BILITOT 0.5    Hepatitis Panel Recent Labs    03/31/23 0523   HEPBSAG NON REACTIVE    Studies/Results: CT ABDOMEN PELVIS W CONTRAST Result Date: 03/30/2023 CLINICAL DATA:  Right lower quadrant abdominal pain. Recently diagnosed with inflammatory bowel disease. EXAM: CT ABDOMEN AND PELVIS WITH CONTRAST TECHNIQUE: Multidetector CT imaging of the abdomen and pelvis was performed using the standard protocol following bolus administration of intravenous contrast. RADIATION DOSE REDUCTION: This exam was performed according to the departmental dose-optimization program which includes automated exposure control, adjustment of the mA and/or kV according to patient size and/or use of iterative reconstruction technique. CONTRAST:  OMNIPAQUE IOHEXOL 300 MG/ML  SOLN COMPARISON:  CT abdomen pelvis dated March 23, 2023. FINDINGS: Lower chest: No acute abnormality. Hepatobiliary: No focal liver abnormality is seen. No gallstones, gallbladder wall thickening, or biliary dilatation. Pancreas: Unremarkable. No pancreatic ductal dilatation or surrounding inflammatory changes. Spleen: Normal in size without focal abnormality. Adrenals/Urinary Tract: Adrenal glands are unremarkable. Unchanged bilateral renal cortical scarring. Unchanged bilateral renal simple cysts measuring up to 5 4 cm. No follow-up imaging is recommended. Unchanged punctate calculus in the upper pole of the left kidney. No hydronephrosis. Stomach/Bowel: The stomach is within normal limits. Multiple skip areas of bowel wall thickening again noted. Wall thickening of the duodenum and proximal jejunum has mildly improved. Continued severe wall thickening of the terminal ileum. Progressive asymmetric irregularity and indistinct enhancing soft tissue density along the  right lateral aspect of the terminal ileum at the ventral mesentery with two new tiny foci of extraluminal air (series 2, image 63; series 5, image 60). No drainable fluid collection. New focal area of wall thickening in the distal ileum (series 2,  image 56) with dilated small bowel loops and fecalization of bowel contents proximal to this area. Vascular/Lymphatic: Aortic atherosclerosis. No enlarged abdominal or pelvic lymph nodes. Reproductive: Prostate is unremarkable. Other: Trace free fluid in the pelvis.  No pneumoperitoneum. Musculoskeletal: No acute or significant osseous findings. Chronic sacroiliitis again noted. IMPRESSION: 1. Sequelae of inflammatory bowel disease again noted with progressive asymmetric irregularity and indistinct enhancing soft tissue density along the right lateral aspect of the terminal ileum at the ventral mesentery, concerning for worsening penetrating disease with developing sinus tract/fistula. No abscess or pneumoperitoneum. 2. New focal area of wall thickening in the distal ileum with dilated small bowel loops and fecalization of bowel contents proximal to this area, concerning for partial small bowel obstruction. 3. Punctate nonobstructive left nephrolithiasis. 4. Chronic sacroiliitis. 5.  Aortic Atherosclerosis (ICD10-I70.0). Electronically Signed   By: Obie Dredge M.D.   On: 03/30/2023 12:52     LOS: 1 day   Akito Boomhower T. Russella Dar, MD 03/31/2023, 10:07 AM See Loretha Stapler, [facility], to contact our on call provider

## 2023-04-01 DIAGNOSIS — D649 Anemia, unspecified: Secondary | ICD-10-CM

## 2023-04-01 DIAGNOSIS — M069 Rheumatoid arthritis, unspecified: Secondary | ICD-10-CM | POA: Diagnosis not present

## 2023-04-01 DIAGNOSIS — K50012 Crohn's disease of small intestine with intestinal obstruction: Secondary | ICD-10-CM | POA: Diagnosis not present

## 2023-04-01 DIAGNOSIS — L732 Hidradenitis suppurativa: Secondary | ICD-10-CM | POA: Diagnosis not present

## 2023-04-01 MED ORDER — SODIUM CHLORIDE 0.9% FLUSH: 3.0000 mL | INTRAVENOUS | Status: DC | PRN

## 2023-04-01 MED ORDER — SODIUM CHLORIDE 0.9 % IV SOLN
INTRAVENOUS | Status: DC | PRN
Start: 2023-04-01 — End: 2023-04-01

## 2023-04-01 MED ORDER — SODIUM CHLORIDE 0.9% FLUSH: 3.0000 mL | Freq: Two times a day (BID) | INTRAVENOUS | Status: DC

## 2023-04-01 NOTE — Progress Notes (Signed)
PROGRESS NOTE    Mark [name]  DGU:[id number] DOB: 1971-04-03 DOA: 03/30/2023 PCP: de [place], Raymond J, MD     Brief Narrative:  Mark [name] is a 52 y.o. male with medical history significant of rheumatoid arthritis on Enbrel, hypertension, hidradenitis suppurativa who presents outside hospital with abdominal pain, nausea and vomiting. CT abdomen pelvis was obtained which demonstrated evidence of inflammatory bowel disease with worsening thickening around the terminal ileum and concern for developing penetrating Crohn's disease.  There is possibility of a developing small bowel obstruction so patient was transferred to [facility] for further assessment.  Surgery and GI was consulted.    New events last 24 hours / Subjective: Feeling well this morning.  Denies any abdominal pain.  Had 4 bowel movements yesterday.  Has been tolerating soft diet this morning.  Assessment & Plan:   Principal Problem:   Crohn's ileitis, with intestinal obstruction (HCC) Active Problems:   Inflammatory bowel disease   Immunosuppression due to drug therapy (HCC)   Crohn's ileitis with pSBO  -Advance to soft diet. -Zosyn -IV Solu-Medrol -GI and general surgery following  Rheumatoid arthritis -Enbrel PTA  Hypertension -Amlodipine, ARB   DVT prophylaxis:  enoxaparin (LOVENOX) injection 40 mg Start: 03/30/23 1800 SCDs Start: 03/30/23 1642  Code Status: Full code Family Communication: None at bedside Disposition Plan: Home Status is: Inpatient Remains inpatient appropriate because: IV antibiotics, IV Solu-Medrol    Antimicrobials:  Anti-infectives (From admission, onward)    Start     Dose/Rate Route Frequency Ordered Stop   03/30/23 1800  metroNIDAZOLE (FLAGYL) IVPB 500 mg  Status:  Discontinued        500 mg 100 mL/hr over 60 Minutes Intravenous Every 12 hours 03/30/23 1559 03/30/23 1614   03/30/23 1700  cefTRIAXone (ROCEPHIN) 2 g in sodium chloride 0.9 % 100 mL IVPB  Status:   Discontinued       Note to Pharmacy: Pharmacy may adjust dosing strength / duration / interval for maximal efficacy   2 g 200 mL/hr over 30 Minutes Intravenous Daily 03/30/23 1559 03/30/23 1614   03/30/23 1700  piperacillin-tazobactam (ZOSYN) IVPB 3.375 g        3.375 g 12.5 mL/hr over 240 Minutes Intravenous Every 8 hours 03/30/23 1614 04/04/23 1359        Objective: Vitals:   03/31/23 1010 03/31/23 1404 03/31/23 2209 04/01/23 0537  BP: (!) 167/93 (!) 169/108 (!) 139/91 (!) 146/93  Pulse: 70 85 72 79  Resp:   16 18  Temp: 98.8 F (37.1 C) 98.6 F (37 C) 98.3 F (36.8 C) 98.4 F (36.9 C)  TempSrc: Oral Oral Oral Oral  SpO2: 100% 95% 100% 98%  Weight:      Height:        Intake/Output Summary (Last 24 hours) at 04/01/2023 1138 Last data filed at 04/01/2023 0600 Gross per 24 hour  Intake 1574.6 ml  Output 350 ml  Net 1224.6 ml   Filed Weights   03/30/23 1014 03/30/23 1514  Weight: 72 kg 73.3 kg    Examination:  General exam: Appears calm and comfortable  Respiratory system: Clear to auscultation. Respiratory effort normal. No respiratory distress. No conversational dyspnea.  Cardiovascular system: S1 & S2 heard, RRR. No murmurs. No pedal edema. Gastrointestinal system: Abdomen is nondistended, soft and nontender. Normal bowel sounds heard. Central nervous system: Alert and oriented. No focal neurological deficits. Speech clear.  Extremities: Symmetric in appearance  Skin: No rashes, lesions or ulcers on exposed skin  Psychiatry:  Judgement and insight appear normal. Mood & affect appropriate.   Data Reviewed: I have personally reviewed following labs and imaging studies  CBC: Recent Labs  Lab 03/30/23 1125 03/31/23 0523  WBC 10.2 7.0  NEUTROABS 7.3  --   HGB 13.5 11.5*  HCT 40.9 35.7*  MCV 88.7 91.1  PLT 428* 385   Basic Metabolic Panel: Recent Labs  Lab 03/30/23 1125 03/30/23 1137 03/31/23 0523  NA 138  --   --   K 3.6  --  3.5  CL 102  --   --    CO2 26  --   --   GLUCOSE 85  --   --   BUN 11  --   --   CREATININE 1.03  --  0.93  CALCIUM 9.3  --   --   MG  --  1.9  --   PHOS  --  2.4*  --    GFR: Estimated Creatinine Clearance: 94.8 mL/min (by C-G formula based on SCr of 0.93 mg/dL). Liver Function Tests: Recent Labs  Lab 03/30/23 1125  AST 15  ALT 9  ALKPHOS 109  BILITOT 0.5  PROT 8.4*  ALBUMIN 4.0   Recent Labs  Lab 03/30/23 1125  LIPASE 12   No results for input(s): "AMMONIA" in the last 168 hours. Coagulation Profile: No results for input(s): "INR", "PROTIME" in the last 168 hours. Cardiac Enzymes: No results for input(s): "CKTOTAL", "CKMB", "CKMBINDEX", "TROPONINI" in the last 168 hours. BNP (last 3 results) No results for input(s): "PROBNP" in the last 8760 hours. HbA1C: No results for input(s): "HGBA1C" in the last 72 hours. CBG: No results for input(s): "GLUCAP" in the last 168 hours. Lipid Profile: No results for input(s): "CHOL", "HDL", "LDLCALC", "TRIG", "CHOLHDL", "LDLDIRECT" in the last 72 hours. Thyroid Function Tests: No results for input(s): "TSH", "T4TOTAL", "FREET4", "T3FREE", "THYROIDAB" in the last 72 hours. Anemia Panel: No results for input(s): "VITAMINB12", "FOLATE", "FERRITIN", "TIBC", "IRON", "RETICCTPCT" in the last 72 hours. Sepsis Labs: No results for input(s): "PROCALCITON", "LATICACIDVEN" in the last 168 hours.  Recent Results (from the past 240 hours)  Gastrointestinal Panel by PCR , Stool     Status: None   Collection Time: 03/30/23  3:55 PM   Specimen: Stool  Result Value Ref Range Status   Campylobacter species NOT DETECTED NOT DETECTED Final   Plesimonas shigelloides NOT DETECTED NOT DETECTED Final   Salmonella species NOT DETECTED NOT DETECTED Final   Yersinia enterocolitica NOT DETECTED NOT DETECTED Final   Vibrio species NOT DETECTED NOT DETECTED Final   Vibrio cholerae NOT DETECTED NOT DETECTED Final   Enteroaggregative E coli (EAEC) NOT DETECTED NOT DETECTED  Final   Enteropathogenic E coli (EPEC) NOT DETECTED NOT DETECTED Final   Enterotoxigenic E coli (ETEC) NOT DETECTED NOT DETECTED Final   Shiga like toxin producing E coli (STEC) NOT DETECTED NOT DETECTED Final   Shigella/Enteroinvasive E coli (EIEC) NOT DETECTED NOT DETECTED Final   Cryptosporidium NOT DETECTED NOT DETECTED Final   Cyclospora cayetanensis NOT DETECTED NOT DETECTED Final   Entamoeba histolytica NOT DETECTED NOT DETECTED Final   Giardia lamblia NOT DETECTED NOT DETECTED Final   Adenovirus F40/41 NOT DETECTED NOT DETECTED Final   Astrovirus NOT DETECTED NOT DETECTED Final   Norovirus GI/GII NOT DETECTED NOT DETECTED Final   Rotavirus A NOT DETECTED NOT DETECTED Final   Sapovirus (I, II, IV, and V) NOT DETECTED NOT DETECTED Final    Comment: Performed at [facility], [address] [place], [place] [address]  C Difficile Quick Screen w PCR reflex     Status: None   Collection Time: 03/30/23  3:55 PM   Specimen: STOOL  Result Value Ref Range Status   C Diff antigen NEGATIVE NEGATIVE Final   C Diff toxin NEGATIVE NEGATIVE Final   C Diff interpretation No C. difficile detected.  Final    Comment: Performed at [facility], [address] [place], [place] [address]      Radiology Studies: CT ABDOMEN PELVIS W CONTRAST Result Date: 03/30/2023 CLINICAL DATA:  Right lower quadrant abdominal pain. Recently diagnosed with inflammatory bowel disease. EXAM: CT ABDOMEN AND PELVIS WITH CONTRAST TECHNIQUE: Multidetector CT imaging of the abdomen and pelvis was performed using the standard protocol following bolus administration of intravenous contrast. RADIATION DOSE REDUCTION: This exam was performed according to the departmental dose-optimization program which includes automated exposure control, adjustment of the mA and/or kV according to patient size and/or use of iterative reconstruction technique. CONTRAST:  OMNIPAQUE IOHEXOL 300 MG/ML  SOLN  COMPARISON:  CT abdomen pelvis dated March 23, 2023. FINDINGS: Lower chest: No acute abnormality. Hepatobiliary: No focal liver abnormality is seen. No gallstones, gallbladder wall thickening, or biliary dilatation. Pancreas: Unremarkable. No pancreatic ductal dilatation or surrounding inflammatory changes. Spleen: Normal in size without focal abnormality. Adrenals/Urinary Tract: Adrenal glands are unremarkable. Unchanged bilateral renal cortical scarring. Unchanged bilateral renal simple cysts measuring up to 5 4 cm. No follow-up imaging is recommended. Unchanged punctate calculus in the upper pole of the left kidney. No hydronephrosis. Stomach/Bowel: The stomach is within normal limits. Multiple skip areas of bowel wall thickening again noted. Wall thickening of the duodenum and proximal jejunum has mildly improved. Continued severe wall thickening of the terminal ileum. Progressive asymmetric irregularity and indistinct enhancing soft tissue density along the right lateral aspect of the terminal ileum at the ventral mesentery with two new tiny foci of extraluminal air (series 2, image 63; series 5, image 60). No drainable fluid collection. New focal area of wall thickening in the distal ileum (series 2, image 56) with dilated small bowel loops and fecalization of bowel contents proximal to this area. Vascular/Lymphatic: Aortic atherosclerosis. No enlarged abdominal or pelvic lymph nodes. Reproductive: Prostate is unremarkable. Other: Trace free fluid in the pelvis.  No pneumoperitoneum. Musculoskeletal: No acute or significant osseous findings. Chronic sacroiliitis again noted. IMPRESSION: 1. Sequelae of inflammatory bowel disease again noted with progressive asymmetric irregularity and indistinct enhancing soft tissue density along the right lateral aspect of the terminal ileum at the ventral mesentery, concerning for worsening penetrating disease with developing sinus tract/fistula. No abscess or  pneumoperitoneum. 2. New focal area of wall thickening in the distal ileum with dilated small bowel loops and fecalization of bowel contents proximal to this area, concerning for partial small bowel obstruction. 3. Punctate nonobstructive left nephrolithiasis. 4. Chronic sacroiliitis. 5.  Aortic Atherosclerosis (ICD10-I70.0). Electronically Signed   By: Obie Dredge M.D.   On: 03/30/2023 12:52      Scheduled Meds:  amLODipine  5 mg Oral Daily   enoxaparin (LOVENOX) injection  40 mg Subcutaneous Daily   methylPREDNISolone (SOLU-MEDROL) injection  30 mg Intravenous Q12H   sodium chloride flush  3-10 mL Intravenous Q12H   Continuous Infusions:  piperacillin-tazobactam (ZOSYN)  IV 3.375 g (04/01/23 0546)     LOS: 2 days   Time spent: 25 minutes   Noralee Stain, DO [facility] 04/01/2023, 11:38 AM   Available via Epic secure  chat 7am-7pm After these hours, please refer to coverage provider listed on [email or web address]

## 2023-04-01 NOTE — Progress Notes (Signed)
Progress Note   Assessment    Crohn's disease, small bowel, with resolving partial SBO likely inflammation related. GI panel and C diff are negative.    RA on Enbrel Hydradenitis Normocytic anemia    Recommendations   Continue IV Zosyn, IV Solu-Medrol  Advance to soft diet today If diet tolerated, GI symptoms mild consider change to Prednisone, oral antibiotics and discharge tomorrow Consider colonoscopy to evaluate TI as outpatient  Trend CBC, ESR   Staff message sent to Dr. Barron Alvine regarding biologic medication change in coordination with his rheumatologist Outpatient follow-up with Dr. Barron Alvine and Rheumatologist   Chief Complaint   RLQ pain significantly improved. Hungry.  Fecal calprotectin and Quantiferon Gold are pending  Vital signs in last 24 hours: Temp:  [98.3 F (36.8 C)-98.8 F (37.1 C)] 98.4 F (36.9 C) (12/29 0537) Pulse Rate:  [70-85] 79 (12/29 0537) Resp:  [16-18] 18 (12/29 0537) BP: (139-169)/(91-108) 146/93 (12/29 0537) SpO2:  [95 %-100 %] 98 % (12/29 0537) Last BM Date : 04/01/23  General: Alert, well-developed, in NAD Heart:  Regular rate and rhythm; no murmurs Chest: Clear to ascultation bilaterally Abdomen:  Soft, minimal RLQ tenderness and nondistended. Normal bowel sounds, without guarding, and without rebound.   Extremities:  Without edema. Neurologic:  Alert and  oriented x4; grossly normal neurologically. Psych:  Alert and cooperative. Normal mood and affect.  Intake/Output from previous day: 12/28 0701 - 12/29 0700 In: 2482.8 [P.O.:1330; I.V.:1000; IV Piggyback:152.8] Out: 700 [Urine:700] Intake/Output this shift: No intake/output data recorded.  Lab Results: Recent Labs    03/30/23 1125 03/31/23 0523  WBC 10.2 7.0  HGB 13.5 11.5*  HCT 40.9 35.7*  PLT 428* 385   BMET Recent Labs    03/30/23 1125 03/31/23 0523  NA 138  --   K 3.6 3.5  CL 102  --   CO2 26  --   GLUCOSE 85  --   BUN 11  --   CREATININE 1.03  0.93  CALCIUM 9.3  --    LFT Recent Labs    03/30/23 1125  PROT 8.4*  ALBUMIN 4.0  AST 15  ALT 9  ALKPHOS 109  BILITOT 0.5   PT/INR No results for input(s): "LABPROT", "INR" in the last 72 hours. Hepatitis Panel Recent Labs    03/31/23 0523  HEPBSAG NON REACTIVE  HCVAB NON REACTIVE    Studies/Results: CT ABDOMEN PELVIS W CONTRAST Result Date: 03/30/2023 CLINICAL DATA:  Right lower quadrant abdominal pain. Recently diagnosed with inflammatory bowel disease. EXAM: CT ABDOMEN AND PELVIS WITH CONTRAST TECHNIQUE: Multidetector CT imaging of the abdomen and pelvis was performed using the standard protocol following bolus administration of intravenous contrast. RADIATION DOSE REDUCTION: This exam was performed according to the departmental dose-optimization program which includes automated exposure control, adjustment of the mA and/or kV according to patient size and/or use of iterative reconstruction technique. CONTRAST:  OMNIPAQUE IOHEXOL 300 MG/ML  SOLN COMPARISON:  CT abdomen pelvis dated March 23, 2023. FINDINGS: Lower chest: No acute abnormality. Hepatobiliary: No focal liver abnormality is seen. No gallstones, gallbladder wall thickening, or biliary dilatation. Pancreas: Unremarkable. No pancreatic ductal dilatation or surrounding inflammatory changes. Spleen: Normal in size without focal abnormality. Adrenals/Urinary Tract: Adrenal glands are unremarkable. Unchanged bilateral renal cortical scarring. Unchanged bilateral renal simple cysts measuring up to 5 4 cm. No follow-up imaging is recommended. Unchanged punctate calculus in the upper pole of the left kidney. No hydronephrosis. Stomach/Bowel: The stomach is within normal limits. Multiple skip  areas of bowel wall thickening again noted. Wall thickening of the duodenum and proximal jejunum has mildly improved. Continued severe wall thickening of the terminal ileum. Progressive asymmetric irregularity and indistinct enhancing  soft tissue density along the right lateral aspect of the terminal ileum at the ventral mesentery with two new tiny foci of extraluminal air (series 2, image 63; series 5, image 60). No drainable fluid collection. New focal area of wall thickening in the distal ileum (series 2, image 56) with dilated small bowel loops and fecalization of bowel contents proximal to this area. Vascular/Lymphatic: Aortic atherosclerosis. No enlarged abdominal or pelvic lymph nodes. Reproductive: Prostate is unremarkable. Other: Trace free fluid in the pelvis.  No pneumoperitoneum. Musculoskeletal: No acute or significant osseous findings. Chronic sacroiliitis again noted. IMPRESSION: 1. Sequelae of inflammatory bowel disease again noted with progressive asymmetric irregularity and indistinct enhancing soft tissue density along the right lateral aspect of the terminal ileum at the ventral mesentery, concerning for worsening penetrating disease with developing sinus tract/fistula. No abscess or pneumoperitoneum. 2. New focal area of wall thickening in the distal ileum with dilated small bowel loops and fecalization of bowel contents proximal to this area, concerning for partial small bowel obstruction. 3. Punctate nonobstructive left nephrolithiasis. 4. Chronic sacroiliitis. 5.  Aortic Atherosclerosis (ICD10-I70.0). Electronically Signed   By: Obie Dredge M.D.   On: 03/30/2023 12:52     LOS: 2 days   Judie Petit T. Russella Dar, MD 04/01/2023, 9:38 AM See Loretha Stapler,  [facility], to contact our on call provider

## 2023-04-02 ENCOUNTER — Other Ambulatory Visit (HOSPITAL_BASED_OUTPATIENT_CLINIC_OR_DEPARTMENT_OTHER): Payer: Self-pay

## 2023-04-02 ENCOUNTER — Ambulatory Visit (HOSPITAL_BASED_OUTPATIENT_CLINIC_OR_DEPARTMENT_OTHER): Payer: BC Managed Care – PPO | Admitting: Family Medicine

## 2023-04-02 DIAGNOSIS — K50012 Crohn's disease of small intestine with intestinal obstruction: Secondary | ICD-10-CM | POA: Diagnosis not present

## 2023-04-02 MED ORDER — AMLODIPINE BESYLATE 5 MG PO TABS
5.0000 mg | ORAL_TABLET | Freq: Every day | ORAL | 1 refills | Status: DC
  Filled 2023-04-02: qty 90, 90d supply, fill #0

## 2023-04-02 MED ORDER — AMOXICILLIN-POT CLAVULANATE 875-125 MG PO TABS
1.0000 | ORAL_TABLET | Freq: Two times a day (BID) | ORAL | 0 refills | Status: AC
  Filled 2023-04-02: qty 10, 5d supply, fill #0

## 2023-04-02 MED ORDER — PREDNISONE 10 MG PO TABS
ORAL_TABLET | ORAL | 0 refills | Status: DC
  Filled 2023-04-02: qty 91, 42d supply, fill #0

## 2023-04-02 MED ORDER — AMOXICILLIN-POT CLAVULANATE 875-125 MG PO TABS
1.0000 | ORAL_TABLET | Freq: Two times a day (BID) | ORAL | Status: DC
  Administered 2023-04-02: 1 via ORAL
  Filled 2023-04-02: qty 1

## 2023-04-02 NOTE — Progress Notes (Signed)
Crohn's ileitis, with intestinal obstruction (HCC)  Subjective: Pt feeling better, having bowel function, tolerating a diet  Objective: Vital signs in last 24 hours: Temp:  [98 F (36.7 C)-98.3 F (36.8 C)] 98 F (36.7 C) (12/30 0511) Pulse Rate:  [69-88] 74 (12/30 0511) Resp:  [17-20] 17 (12/30 0511) BP: (139-191)/(85-116) 152/103 (12/30 0511) SpO2:  [100 %] 100 % (12/30 0511) Last BM Date : 04/01/23  Intake/Output from previous day: 12/29 0701 - 12/30 0700 In: 1817.4 [P.O.:1660; IV Piggyback:157.4] Out: -  Intake/Output this shift: No intake/output data recorded.  General appearance: alert and cooperative GI: soft, non-distended  Lab Results:  No results found for this or any previous visit (from the past 24 hours).   Studies/Results Radiology     MEDS, Scheduled  amLODipine  5 mg Oral Daily   enoxaparin (LOVENOX) injection  40 mg Subcutaneous Daily   methylPREDNISolone (SOLU-MEDROL) injection  30 mg Intravenous Q12H   sodium chloride flush  3-10 mL Intravenous Q12H     Assessment: Crohn's ileitis, with intestinal obstruction (HCC) Improving with medical treatment  Plan: Will sign off and defer outpatient f/u to GI   LOS: 3 days    Vanita Panda, MD [facility]  Patient's medical decision making was straightforward (25 mins met or exceeded with patient care and documentation).   04/02/2023 8:45 AM

## 2023-04-02 NOTE — Plan of Care (Signed)
  Problem: Education: Goal: Knowledge of General Education information will improve Description: Including pain rating scale, medication(s)/side effects and non-pharmacologic comfort measures Outcome: Progressing   Problem: Activity: Goal: Risk for activity intolerance will decrease Outcome: Progressing   

## 2023-04-02 NOTE — Progress Notes (Addendum)
[facility] Progress Note  CC:  Abdominal pain, CTAP concerning for IBD   Subjective: He is tolerating a soft diet.  No nausea or vomiting.  No abdominal pain.  He passed a soft brown bowel movement yesterday evening.  He he is passing gas per the rectum this morning.  He wishes to go home.   Objective:  Vital signs in last 24 hours: Temp:  [98 F (36.7 C)-98.3 F (36.8 C)] 98 F (36.7 C) (12/30 0511) Pulse Rate:  [69-88] 74 (12/30 0511) Resp:  [17-20] 17 (12/30 0511) BP: (139-191)/(85-116) 152/103 (12/30 0511) SpO2:  [100 %] 100 % (12/30 0511) Last BM Date : 04/01/23 General: Alert 52 year old male in no acute distress. Heart: Regular rate and rhythm, no murmurs. Pulm: Breath sounds clear throughout. Abdomen: Soft, nondistended.  Nontender.  Positive bowel sounds all 4 quadrants. Extremities: No edema. Neurologic:  Alert and  oriented x 4. Grossly normal neurologically. Psych:  Alert and cooperative. Normal mood and affect.  Intake/Output from previous day: 12/29 0701 - 12/30 0700 In: 1817.4 [P.O.:1660; IV Piggyback:157.4] Out: -  Intake/Output this shift: No intake/output data recorded.  Lab Results: Recent Labs    03/30/23 1125 03/31/23 0523  WBC 10.2 7.0  HGB 13.5 11.5*  HCT 40.9 35.7*  PLT 428* 385   BMET Recent Labs    03/30/23 1125 03/31/23 0523  NA 138  --   K 3.6 3.5  CL 102  --   CO2 26  --   GLUCOSE 85  --   BUN 11  --   CREATININE 1.03 0.93  CALCIUM 9.3  --    LFT Recent Labs    03/30/23 1125  PROT 8.4*  ALBUMIN 4.0  AST 15  ALT 9  ALKPHOS 109  BILITOT 0.5   PT/INR No results for input(s): "LABPROT", "INR" in the last 72 hours. Hepatitis Panel Recent Labs    03/31/23 0523  HEPBSAG NON REACTIVE  HCVAB NON REACTIVE     Patient Profile:  Mark [name] is a 52 y.o. male with a past medical history of rheumatoid arthritis (previously on Humir, switched to Enbrel approximately 1 and 1/2 years ago),  hypertension, anemia, hidradenitis status post excision 09/2018 and perirectal abscess status post I&D 07/2018 who was admitted to the hospital/03/2023 with N/V and diarrhea.    Assessment / Plan:  52 year old male with N/V and diarrhea. CTAP showed progressive asymmetric irregularity and indistinct enhancing soft tissue density along the right lateral aspect of the terminal ileum concerning for worsening penetrating disease with developing sinus tract/fistula without abscess and a new focal area of wall thickening in the distal ileum with dilated small bowel loops and fecalization of bowel contents proximal to this area, concerning for a partial small bowel obstruction. On Solu-Medrol IV and Zosyn IV. Negative C. Diff and GI pathogen panel.  Sed rate 59.  Normal CRP. Seen by general surgery who recommended conservative management, no indication for surgery at this time. Normal colonoscopy 01/2023. Patient likely has small bowel Crohn's disease with resolving partial SBO.  Labs in preparation for future biologic therapy and possible immune modulator were done which showed negative hepatitis B and C serologies. QuantiFERON gold and TPMT results are pending. -Transition IV steroids to Prednisone p.o. prior to discharge -Transition IV Zosyn to p.o. antibiotics at time of discharge -Follow-up with Dr. Barron Alvine his primary gastroenterologist and rheumatology to coordinate biologic treatment for IBD and rheumatoid arthritis -CBC, CMP and CRP in one week, our office  will facilitate lab order -Consider repeat colonoscopy to evaluate TI as outpatient  -Eventual small bowel enterography vs CT enterography as an outpatient  -Continue soft diet as tolerated -No NSAIDs -Await further recommendations per Dr. Chales Abrahams  Mild normocytic anemia, secondary to Crohn's disease.  No active GI bleeding.   Rheumatoid arthritis on Enbrel injections weekly  Principal Problem:   Crohn's ileitis, with intestinal obstruction  (HCC) Active Problems:   Inflammatory bowel disease   Immunosuppression due to drug therapy (HCC)     LOS: 3 days   Mark [name]  04/02/2023, 9:49 AM   Attending physician's note   I have taken history, reviewed the chart and examined the patient. I performed a substantive portion of this encounter, including complete performance of at least one of the key components, in conjunction with the APP. I agree with the Advanced Practitioner's note, impression and recommendations.   Got sign out from Dr. Russella Dar.  Patient doing very well.  No abdominal pain.  No diarrhea, nausea or vomiting.  Tolerating p.o. without any problems.  Wants to go home.  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   pSBO (responded well to steroids) RA on Enbrel (failed Humira and Rinvoq) Hydradenitis suppurativa  Plan: -Tapering prednisone (pred 40 x 1 week, 30 x 1 week, 20 x 2 weeks, then 10 x 2 weeks, then stop) -Follow results of TB Gold/TPMT (neg hep B/C) -FU with Dr. Barron Alvine as outpt-will need to coordinate biologic treatment for IBD/rheumatoid arthritis in conjunction with rheumatology.  Dr. Russella Dar has already sent a staff message to Dr. Barron Alvine. -Appreciate surgical consultation.(Recommend to manage medically for now) -Consider CTE/rpt colon with TI intubation, depending upon clinical course, in future. -Will sign off for now.   Edman Circle, MD [facility] [phone]

## 2023-04-02 NOTE — Progress Notes (Signed)
   04/02/23 0941  TOC Brief Assessment  Insurance and Status Reviewed  Patient has primary care physician Yes  Home environment has been reviewed Resides with parent  Prior level of function: Independent at baseline  Prior/Current Home Services No current home services  Social Drivers of Health Review SDOH reviewed no interventions necessary  Readmission risk has been reviewed Yes  Transition of care needs no transition of care needs at this time

## 2023-04-02 NOTE — Discharge Summary (Addendum)
Physician Discharge Summary  Kentley Bogardus OZH:[id number] DOB: 1970/12/08 DOA: 03/30/2023  PCP: de [place], Raymond J, MD  Admit date: 03/30/2023 Discharge date: 04/02/2023  Admitted From: Home Disposition: Home  Recommendations for Outpatient Follow-up:  Follow up with PCP Follow up with GI Follow up with Rheumatology   Discharge Condition: Stable CODE STATUS: Full Diet recommendation:  Diet Orders (From admission, onward)     Start     Ordered   04/02/23 0000  Diet general        04/02/23 1606   04/01/23 0911  DIET SOFT Fluid consistency: Thin  Diet effective now       Question:  Fluid consistency:  Answer:  Thin   04/01/23 0910           Brief/Interim Summary: Marzell Leddon is a 52 y.o. male with medical history significant of rheumatoid arthritis on Enbrel, hypertension, hidradenitis suppurativa who presents outside hospital with abdominal pain, nausea and vomiting. CT abdomen pelvis was obtained which demonstrated evidence of inflammatory bowel disease with worsening thickening around the terminal ileum and concern for developing penetrating Crohn's disease.  There is possibility of a developing small bowel obstruction so patient was transferred to [facility] for further assessment.  Surgery and GI was consulted.  Patient was started on IV Zosyn and IV Solu-Medrol with clinical improvement.  Discharge Diagnoses:   Principal Problem:   Crohn's ileitis, with intestinal obstruction (HCC) Active Problems:   Inflammatory bowel disease   Immunosuppression due to drug therapy (HCC)   Crohn's ileitis with pSBO  -Tolerating soft diet without any issues -Zosyn --> Augmentin  -IV Solu-Medrol --> Prednisone taper  -Follow up with Dr. Barron Alvine    Rheumatoid arthritis -Enbrel PTA -Follow up with rheumatology outpatient    Hypertension -Amlodipine    Discharge Instructions  Discharge Instructions     Call MD for:  difficulty breathing, headache or visual  disturbances   Complete by: As directed    Call MD for:  extreme fatigue   Complete by: As directed    Call MD for:  persistant dizziness or light-headedness   Complete by: As directed    Call MD for:  persistant nausea and vomiting   Complete by: As directed    Call MD for:  severe uncontrolled pain   Complete by: As directed    Call MD for:  temperature >100.4   Complete by: As directed    Diet general   Complete by: As directed    Discharge instructions   Complete by: As directed    You were cared for by a hospitalist during your hospital stay. If you have any questions about your discharge medications or the care you received while you were in the hospital after you are discharged, you can call the unit and ask to speak with the hospitalist on call if the hospitalist that took care of you is not available. Once you are discharged, your primary care physician will handle any further medical issues. Please note that NO REFILLS for any discharge medications will be authorized once you are discharged, as it is imperative that you return to your primary care physician (or establish a relationship with a primary care physician if you do not have one) for your aftercare needs so that they can reassess your need for medications and monitor your lab values.   Increase activity slowly   Complete by: As directed       Allergies as of 04/02/2023  Reactions   Peanut-containing Drug Products Itching, Rash        Medication List     STOP taking these medications    ciprofloxacin 500 MG tablet Commonly known as: CIPRO   metroNIDAZOLE 500 MG tablet Commonly known as: FLAGYL   oxyCODONE-acetaminophen 5-325 MG tablet Commonly known as: Percocet   valsartan 80 MG tablet Commonly known as: DIOVAN       TAKE these medications    Advil 200 MG Caps Generic drug: Ibuprofen Take 200-400 mg by mouth every 8 (eight) hours as needed (for pain).   amLODipine 5 MG tablet Commonly  known as: NORVASC Take 1 tablet (5 mg total) by mouth daily.   amoxicillin-clavulanate 875-125 MG tablet Commonly known as: AUGMENTIN Take 1 tablet by mouth every 12 (twelve) hours for 5 days.   dicyclomine 20 MG tablet Commonly known as: BENTYL Take 1 tablet (20 mg total) by mouth 2 (two) times daily as needed for spasms (abdominal cramping).   docusate sodium 100 MG capsule Commonly known as: COLACE Take 1 capsule (100 mg total) by mouth every 12 (twelve) hours.   Enbrel SureClick 50 MG/ML injection Generic drug: etanercept Inject 50 mg into the skin every Monday.   METAMUCIL SMOOTH TEXTURE PO Take 3.4-4 g by mouth See admin instructions. Mix 3.4-4 grams into eight ounces of water and drink by mouth once a day   predniSONE 10 MG tablet Commonly known as: DELTASONE 40mg  daily for 1 week, then 30mg  daily for 1 week, then 20mg  daily for 2 weeks, then 10mg  daily for 2 weeks.        Follow-up Information     de Peru, Buren Kos, MD Follow up.   Specialty: Family Medicine Contact information: [address] [place] [address] [phone]         Doristine Locks V, DO Follow up.   Specialty: Gastroenterology Contact information: [address] [place] [address] [phone]                Allergies  Allergen Reactions   Peanut-Containing Drug Products Itching and Rash    Consultations: GI General surgery    Procedures/Studies: CT ABDOMEN PELVIS W CONTRAST Result Date: 03/30/2023 CLINICAL DATA:  Right lower quadrant abdominal pain. Recently diagnosed with inflammatory bowel disease. EXAM: CT ABDOMEN AND PELVIS WITH CONTRAST TECHNIQUE: Multidetector CT imaging of the abdomen and pelvis was performed using the standard protocol following bolus administration of intravenous contrast. RADIATION DOSE REDUCTION: This exam was performed according to the departmental dose-optimization program which includes automated exposure control, adjustment of  the mA and/or kV according to patient size and/or use of iterative reconstruction technique. CONTRAST:  OMNIPAQUE IOHEXOL 300 MG/ML  SOLN COMPARISON:  CT abdomen pelvis dated March 23, 2023. FINDINGS: Lower chest: No acute abnormality. Hepatobiliary: No focal liver abnormality is seen. No gallstones, gallbladder wall thickening, or biliary dilatation. Pancreas: Unremarkable. No pancreatic ductal dilatation or surrounding inflammatory changes. Spleen: Normal in size without focal abnormality. Adrenals/Urinary Tract: Adrenal glands are unremarkable. Unchanged bilateral renal cortical scarring. Unchanged bilateral renal simple cysts measuring up to 5 4 cm. No follow-up imaging is recommended. Unchanged punctate calculus in the upper pole of the left kidney. No hydronephrosis. Stomach/Bowel: The stomach is within normal limits. Multiple skip areas of bowel wall thickening again noted. Wall thickening of the duodenum and proximal jejunum has mildly improved. Continued severe wall thickening of the terminal ileum. Progressive asymmetric irregularity and indistinct enhancing soft tissue density along the right lateral  aspect of the terminal ileum at the ventral mesentery with two new tiny foci of extraluminal air (series 2, image 63; series 5, image 60). No drainable fluid collection. New focal area of wall thickening in the distal ileum (series 2, image 56) with dilated small bowel loops and fecalization of bowel contents proximal to this area. Vascular/Lymphatic: Aortic atherosclerosis. No enlarged abdominal or pelvic lymph nodes. Reproductive: Prostate is unremarkable. Other: Trace free fluid in the pelvis.  No pneumoperitoneum. Musculoskeletal: No acute or significant osseous findings. Chronic sacroiliitis again noted. IMPRESSION: 1. Sequelae of inflammatory bowel disease again noted with progressive asymmetric irregularity and indistinct enhancing soft tissue density along the right lateral aspect of the  terminal ileum at the ventral mesentery, concerning for worsening penetrating disease with developing sinus tract/fistula. No abscess or pneumoperitoneum. 2. New focal area of wall thickening in the distal ileum with dilated small bowel loops and fecalization of bowel contents proximal to this area, concerning for partial small bowel obstruction. 3. Punctate nonobstructive left nephrolithiasis. 4. Chronic sacroiliitis. 5.  Aortic Atherosclerosis (ICD10-I70.0). Electronically Signed   By: Obie Dredge M.D.   On: 03/30/2023 12:52   CT ABDOMEN PELVIS W CONTRAST Result Date: 03/23/2023 CLINICAL DATA:  Acute, nonlocalized abdominal pain. Right lower quadrant pain. Possible constipation. EXAM: CT ABDOMEN AND PELVIS WITH CONTRAST TECHNIQUE: Multidetector CT imaging of the abdomen and pelvis was performed using the standard protocol following bolus administration of intravenous contrast. RADIATION DOSE REDUCTION: This exam was performed according to the departmental dose-optimization program which includes automated exposure control, adjustment of the mA and/or kV according to patient size and/or use of iterative reconstruction technique. CONTRAST:  85mL OMNIPAQUE IOHEXOL 300 MG/ML  SOLN COMPARISON:  11/02/2021 FINDINGS: Lower chest:  Mild atelectasis at the lung bases. Hepatobiliary: No focal liver abnormality.No evidence of biliary obstruction or stone. Pancreas: Unremarkable. Spleen: Unremarkable. Adrenals/Urinary Tract: Negative adrenals. No hydronephrosis or ureteral calculus. Punctate left upper pole calculus, see coronal reformats. Lobulated bilateral renal cortex attributed to scarring. Bilateral renal cystic densities, largest at the left interpolar kidney measuring 5.4 cm. Unremarkable bladder. Stomach/Bowel: Skip areas of bowel wall thickening including at the duodenum and proximal jejunum, terminal ileum, left lower quadrant small bowel, and likely at the left colon. Most pronounced swelling at the  terminal ileum where there is indistinct enhancing soft tissue density at the ventral mesentery. Regional peritoneal thickening considered reactive peritonitis. No pneumoperitoneum or drainable collection. Vascular/Lymphatic: Extensive atheromatous wall thickening of the aorta and iliacs for age. No acute vascular finding . Thickening of mesenteric lymph nodes considered reactive in this setting. Reproductive:No pathologic findings. Other: No ascites or pneumoperitoneum. Musculoskeletal: Sacroiliac joint irregularity again seen and consistent with chronic sacroiliitis. Regional sclerosis is presumably related and is stable. IMPRESSION: 1. Long-standing pattern of Crohn's with progression since 2023 at the terminal ileum where there is ventral penetrating disease but no drainable collection or pneumoperitoneum. 2. Premature aortic atherosclerosis. 3. Bilateral renal cortical scarring; punctate left renal calculus. 4. Chronic sacroiliitis. Electronically Signed   By: Tiburcio Pea M.D.   On: 03/23/2023 05:45       Discharge Exam: Vitals:   04/02/23 0511 04/02/23 1337  BP: (!) 152/103 (!) 164/96  Pulse: 74 75  Resp: 17 18  Temp: 98 F (36.7 C) 98.3 F (36.8 C)  SpO2: 100% 97%    General: Pt is alert, awake, not in acute distress Cardiovascular: RRR, S1/S2 +, no edema Respiratory: CTA bilaterally, no wheezing, no rhonchi, no respiratory distress, no conversational dyspnea  Abdominal: Soft, NT, ND, bowel sounds + Extremities: no edema, no cyanosis Psych: Normal mood and affect, stable judgement and insight     The results of significant diagnostics from this hospitalization (including imaging, microbiology, ancillary and laboratory) are listed below for reference.     Microbiology: Recent Results (from the past 240 hours)  Gastrointestinal Panel by PCR , Stool     Status: None   Collection Time: 03/30/23  3:55 PM   Specimen: Stool  Result Value Ref Range Status   Campylobacter species  NOT DETECTED NOT DETECTED Final   Plesimonas shigelloides NOT DETECTED NOT DETECTED Final   Salmonella species NOT DETECTED NOT DETECTED Final   Yersinia enterocolitica NOT DETECTED NOT DETECTED Final   Vibrio species NOT DETECTED NOT DETECTED Final   Vibrio cholerae NOT DETECTED NOT DETECTED Final   Enteroaggregative E coli (EAEC) NOT DETECTED NOT DETECTED Final   Enteropathogenic E coli (EPEC) NOT DETECTED NOT DETECTED Final   Enterotoxigenic E coli (ETEC) NOT DETECTED NOT DETECTED Final   Shiga like toxin producing E coli (STEC) NOT DETECTED NOT DETECTED Final   Shigella/Enteroinvasive E coli (EIEC) NOT DETECTED NOT DETECTED Final   Cryptosporidium NOT DETECTED NOT DETECTED Final   Cyclospora cayetanensis NOT DETECTED NOT DETECTED Final   Entamoeba histolytica NOT DETECTED NOT DETECTED Final   Giardia lamblia NOT DETECTED NOT DETECTED Final   Adenovirus F40/41 NOT DETECTED NOT DETECTED Final   Astrovirus NOT DETECTED NOT DETECTED Final   Norovirus GI/GII NOT DETECTED NOT DETECTED Final   Rotavirus A NOT DETECTED NOT DETECTED Final   Sapovirus (I, II, IV, and V) NOT DETECTED NOT DETECTED Final    Comment: Performed at [facility], [address] [place], [place] [address]  C Difficile Quick Screen w PCR reflex     Status: None   Collection Time: 03/30/23  3:55 PM   Specimen: STOOL  Result Value Ref Range Status   C Diff antigen NEGATIVE NEGATIVE Final   C Diff toxin NEGATIVE NEGATIVE Final   C Diff interpretation No C. difficile detected.  Final    Comment: Performed at [facility], [address], [place], [place] [address]     Labs: BNP (last 3 results) No results for input(s): "BNP" in the last 8760 hours. Basic Metabolic Panel: Recent Labs  Lab 03/30/23 1125 03/30/23 1137 03/31/23 0523  NA 138  --   --   K 3.6  --  3.5  CL 102  --   --   CO2 26  --   --   GLUCOSE 85  --   --   BUN 11  --   --   CREATININE 1.03  --  0.93  CALCIUM 9.3   --   --   MG  --  1.9  --   PHOS  --  2.4*  --    Liver Function Tests: Recent Labs  Lab 03/30/23 1125  AST 15  ALT 9  ALKPHOS 109  BILITOT 0.5  PROT 8.4*  ALBUMIN 4.0   Recent Labs  Lab 03/30/23 1125  LIPASE 12   No results for input(s): "AMMONIA" in the last 168 hours. CBC: Recent Labs  Lab 03/30/23 1125 03/31/23 0523  WBC 10.2 7.0  NEUTROABS 7.3  --   HGB 13.5 11.5*  HCT 40.9 35.7*  MCV 88.7 91.1  PLT 428* 385   Cardiac Enzymes: No results for input(s): "CKTOTAL", "CKMB", "CKMBINDEX", "TROPONINI" in the last 168 hours. BNP: Invalid  input(s): "POCBNP" CBG: No results for input(s): "GLUCAP" in the last 168 hours. D-Dimer No results for input(s): "DDIMER" in the last 72 hours. Hgb A1c No results for input(s): "HGBA1C" in the last 72 hours. Lipid Profile No results for input(s): "CHOL", "HDL", "LDLCALC", "TRIG", "CHOLHDL", "LDLDIRECT" in the last 72 hours. Thyroid function studies No results for input(s): "TSH", "T4TOTAL", "T3FREE", "THYROIDAB" in the last 72 hours.  Invalid input(s): "FREET3" Anemia work up No results for input(s): "VITAMINB12", "FOLATE", "FERRITIN", "TIBC", "IRON", "RETICCTPCT" in the last 72 hours. Urinalysis    Component Value Date/Time   COLORURINE YELLOW 03/30/2023 1125   APPEARANCEUR CLEAR 03/30/2023 1125   LABSPEC 1.046 (H) 03/30/2023 1125   PHURINE 7.0 03/30/2023 1125   GLUCOSEU NEGATIVE 03/30/2023 1125   HGBUR NEGATIVE 03/30/2023 1125   BILIRUBINUR NEGATIVE 03/30/2023 1125   KETONESUR 15 (A) 03/30/2023 1125   PROTEINUR TRACE (A) 03/30/2023 1125   NITRITE NEGATIVE 03/30/2023 1125   LEUKOCYTESUR NEGATIVE 03/30/2023 1125   Sepsis Labs Recent Labs  Lab 03/30/23 1125 03/31/23 0523  WBC 10.2 7.0   Microbiology Recent Results (from the past 240 hours)  Gastrointestinal Panel by PCR , Stool     Status: None   Collection Time: 03/30/23  3:55 PM   Specimen: Stool  Result Value Ref Range Status   Campylobacter species NOT  DETECTED NOT DETECTED Final   Plesimonas shigelloides NOT DETECTED NOT DETECTED Final   Salmonella species NOT DETECTED NOT DETECTED Final   Yersinia enterocolitica NOT DETECTED NOT DETECTED Final   Vibrio species NOT DETECTED NOT DETECTED Final   Vibrio cholerae NOT DETECTED NOT DETECTED Final   Enteroaggregative E coli (EAEC) NOT DETECTED NOT DETECTED Final   Enteropathogenic E coli (EPEC) NOT DETECTED NOT DETECTED Final   Enterotoxigenic E coli (ETEC) NOT DETECTED NOT DETECTED Final   Shiga like toxin producing E coli (STEC) NOT DETECTED NOT DETECTED Final   Shigella/Enteroinvasive E coli (EIEC) NOT DETECTED NOT DETECTED Final   Cryptosporidium NOT DETECTED NOT DETECTED Final   Cyclospora cayetanensis NOT DETECTED NOT DETECTED Final   Entamoeba histolytica NOT DETECTED NOT DETECTED Final   Giardia lamblia NOT DETECTED NOT DETECTED Final   Adenovirus F40/41 NOT DETECTED NOT DETECTED Final   Astrovirus NOT DETECTED NOT DETECTED Final   Norovirus GI/GII NOT DETECTED NOT DETECTED Final   Rotavirus A NOT DETECTED NOT DETECTED Final   Sapovirus (I, II, IV, and V) NOT DETECTED NOT DETECTED Final    Comment: Performed at [facility], [address] [place], [place] [address]  C Difficile Quick Screen w PCR reflex     Status: None   Collection Time: 03/30/23  3:55 PM   Specimen: STOOL  Result Value Ref Range Status   C Diff antigen NEGATIVE NEGATIVE Final   C Diff toxin NEGATIVE NEGATIVE Final   C Diff interpretation No C. difficile detected.  Final    Comment: Performed at [facility], [address], [place], [place] [address]     Patient was seen and examined on the day of discharge and was found to be in stable condition. Time coordinating discharge: 25 minutes including assessment and coordination of care, as well as examination of the patient.   SIGNED:  Noralee Stain, DO [facility] 04/02/2023, 4:07 PM

## 2023-04-02 NOTE — Plan of Care (Signed)
?  Problem: Clinical Measurements: ?Goal: Will remain free from infection ?Outcome: Progressing ?  ?

## 2023-04-03 ENCOUNTER — Telehealth (HOSPITAL_BASED_OUTPATIENT_CLINIC_OR_DEPARTMENT_OTHER): Payer: Self-pay

## 2023-04-03 NOTE — Transitions of Care (Post Inpatient/ED Visit) (Signed)
 04/03/2023  Name: Mark [name] MRN: [id number] DOB: [birth year]  Today's TOC FU Call Status: Today's TOC FU Call Status:: Successful TOC FU Call Completed TOC FU Call Complete Date: 04/03/23 Patient's Name and Date of Birth confirmed.  Transition Care Management Follow-up Telephone Call Date of Discharge: 04/02/23 Discharge Facility: [facility]) Type of Discharge: Inpatient Admission Primary Inpatient Discharge Diagnosis:: abd pain How have you been since you were released from the hospital?: Better Any questions or concerns?: No  Items Reviewed: Did you receive and understand the discharge instructions provided?: Yes Medications obtained,verified, and reconciled?: Yes (Medications Reviewed) Any new allergies since your discharge?: No Dietary orders reviewed?: Yes Do you have support at home?: Yes People in Home: parent(s)  Medications Reviewed Today: Medications Reviewed Today     Reviewed by Emmitt Pan, LPN (Licensed Practical Nurse) on 04/03/23 at [phone]  Med List Status: <None>   Medication Order Taking? Sig Documenting Provider Last Dose Status Informant  ADVIL  200 MG CAPS [id number] No Take 200-400 mg by mouth every 8 (eight) hours as needed (for pain).  Patient not taking: Reported on 04/03/2023   [provider] Not Taking Active Self  amLODipine  (NORVASC ) 5 MG tablet [id number] Yes Take 1 tablet (5 mg total) by mouth daily. Rojelio Nest, DO Taking Active   amoxicillin -clavulanate (AUGMENTIN ) 875-125 MG tablet [id number] Yes Take 1 tablet by mouth every 12 (twelve) hours for 5 days. Rojelio Nest, DO Taking Active   dicyclomine  (BENTYL ) 20 MG tablet [id number] No Take 1 tablet (20 mg total) by mouth 2 (two) times daily as needed for spasms (abdominal cramping).  Patient not taking: Reported on 04/03/2023   Mesner, Selinda, MD Not Taking Active Self  docusate sodium  (COLACE) 100 MG capsule [id number] No Take 1 capsule (100 mg total) by mouth every 12  (twelve) hours.  Patient not taking: Reported on 04/03/2023   Lorette Selinda, MD Not Taking Active Self  ENBREL SURECLICK 50 MG/ML injection [id number] No Inject 50 mg into the skin every Monday.  Patient not taking: Reported on 04/03/2023   [provider] Not Taking Active Self  predniSONE  (DELTASONE ) 10 MG tablet [id number] Yes Take 4 tablets (40 mg total) by mouth daily for 7 days, THEN 3 tablets (30 mg total) daily for 7 days, THEN 2 tablets (20 mg total) daily for 14 days, THEN 1 tablet (10 mg total) daily for 14 days THEN STOP Rojelio Nest, DO Taking Active   Psyllium (METAMUCIL SMOOTH TEXTURE PO) [id number] No Take 3.4-4 g by mouth See admin instructions. Mix 3.4-4 grams into eight ounces of water and drink by mouth once a day  Patient not taking: Reported on 04/03/2023   [provider] Not Taking Active Self            Home Care and Equipment/Supplies: Were Home Health Services Ordered?: NA Any new equipment or medical supplies ordered?: NA  Functional Questionnaire: Do you need assistance with bathing/showering or dressing?: No Do you need assistance with meal preparation?: No Do you need assistance with eating?: No Do you have difficulty maintaining continence: No Do you need assistance with getting out of bed/getting out of a chair/moving?: No Do you have difficulty managing or taking your medications?: No  Follow up appointments reviewed: PCP Follow-up appointment confirmed?: No (no avail appts, sent message to staff to schedule) MD Provider Line [phone] Given: No Specialist Hospital Follow-up appointment confirmed?: No Reason Specialist Follow-Up Not Confirmed: Patient has Specialist Provider Number and will Call for Appointment  Do you need transportation to your follow-up appointment?: No Do you understand care options if your condition(s) worsen?: Yes-patient verbalized understanding    SIGNATURE Julian Lemmings, LPN [facility] Nurse  Health Advisor Direct Dial [phone]

## 2023-04-04 LAB — QUANTIFERON-TB GOLD PLUS: QuantiFERON-TB Gold Plus: NEGATIVE

## 2023-04-04 LAB — QUANTIFERON-TB GOLD PLUS (RQFGPL)
QuantiFERON Mitogen Value: >10 [IU]/mL
QuantiFERON Nil Value: 0 [IU]/mL
QuantiFERON TB1 Ag Value: 0.03 [IU]/mL
QuantiFERON TB2 Ag Value: 0.03 [IU]/mL

## 2023-04-05 ENCOUNTER — Telehealth: Payer: Self-pay

## 2023-04-05 DIAGNOSIS — M069 Rheumatoid arthritis, unspecified: Secondary | ICD-10-CM

## 2023-04-05 DIAGNOSIS — K529 Noninfective gastroenteritis and colitis, unspecified: Secondary | ICD-10-CM

## 2023-04-05 NOTE — Telephone Encounter (Signed)
-----   Message from Sandor LULLA Flatter sent at 04/05/2023 10:16 AM EST ----- Regarding: RE: Biologic Thanks for taking care of him as an inpatient.  Interesting case.  I saw that his QuantiFERON gold returned negative, along with negative viral hepatitis labs (will need hepatitis B vaccine series at some point).  TPMT pending.  Calprotectin also still in process.  Nat, can you please coordinate the following: - Check ESR/CRP next week - Schedule OV with me next week.  Overbook okay - Please make sure he has expedited follow-up with his Rheumatologist in place.  Will need to start coordinating biologic therapy - May need to perform expedited repeat outpatient colonoscopy with ileoscopy along with upper endoscopy to evaluate for upper tract Crohn's given the inflammation of duodenum/proximal jejunum on CT to help establish definitive diagnosis  VC ----- Message ----- From: Ever Greig RAMAN, PA-C Sent: 04/01/2023   9:37 AM EST To: Sandor Flatter LULLA, DO Subject: Biologic                                       Vito, I saw this patient today with Dr. Aneita Lanius did a recent screening colonoscopy on him, admitted now with new diagnosis of Crohn's ileitis with partial obstruction He is improving quickly with bowel rest and IV steroids, may be able to be discharged by tomorrow or Tuesday  Unfortunately he also has history of rheumatoid arthritis and hidradenitis suppurativa-followed by rheumatology. He previously failed Humira, failed Rinvoq-, had previously been on Enbrel at 1 point now back on Enbrel  Will need to determine a biologic that will cover all of his autoimmune diseases:).SABRA  Deferring to you, along with rheumatology, once you decide will need to get prior authorization, and will make sure he gets a quick follow-up with you

## 2023-04-05 NOTE — Telephone Encounter (Signed)
 Patient aware that he will need to have ESR/CRP next week as well as follow up in the office with Dr San.  Patient advised to follow up in the office on 04-11-23 at 9:00 am.  Patient has been in contact with his rheumatologist and is working on getting an appointment with their office.  Patient agreed to plan and verbalized understanding.  No further questions.

## 2023-04-09 ENCOUNTER — Other Ambulatory Visit (INDEPENDENT_AMBULATORY_CARE_PROVIDER_SITE_OTHER): Payer: Self-pay

## 2023-04-09 DIAGNOSIS — M069 Rheumatoid arthritis, unspecified: Secondary | ICD-10-CM

## 2023-04-09 DIAGNOSIS — K529 Noninfective gastroenteritis and colitis, unspecified: Secondary | ICD-10-CM

## 2023-04-09 LAB — C-REACTIVE PROTEIN: CRP: <1 mg/dL (ref 0.5–20.0)

## 2023-04-09 LAB — SEDIMENTATION RATE: Sed Rate: 32 mm/h — ABNORMAL HIGH (ref 0–20)

## 2023-04-10 LAB — THIOPURINE METHYLTRANSFERASE (TPMT), RBC: TPMT Activity:: 19.9 U/mL{RBCs}

## 2023-04-11 ENCOUNTER — Encounter: Payer: Self-pay | Admitting: Gastroenterology

## 2023-04-11 ENCOUNTER — Other Ambulatory Visit (HOSPITAL_BASED_OUTPATIENT_CLINIC_OR_DEPARTMENT_OTHER): Payer: Self-pay

## 2023-04-11 ENCOUNTER — Ambulatory Visit: Payer: 59 | Admitting: Gastroenterology

## 2023-04-11 VITALS — BP 150/88 | HR 63 | Ht 70.0 in | Wt 166.0 lb

## 2023-04-11 DIAGNOSIS — M069 Rheumatoid arthritis, unspecified: Secondary | ICD-10-CM | POA: Diagnosis not present

## 2023-04-11 DIAGNOSIS — K50919 Crohn's disease, unspecified, with unspecified complications: Secondary | ICD-10-CM

## 2023-04-11 DIAGNOSIS — K5090 Crohn's disease, unspecified, without complications: Secondary | ICD-10-CM | POA: Diagnosis not present

## 2023-04-11 DIAGNOSIS — R1031 Right lower quadrant pain: Secondary | ICD-10-CM

## 2023-04-11 DIAGNOSIS — L732 Hidradenitis suppurativa: Secondary | ICD-10-CM

## 2023-04-11 MED ORDER — NA SULFATE-K SULFATE-MG SULF 17.5-3.13-1.6 GM/177ML PO SOLN
1.0000 | ORAL | 0 refills | Status: DC
  Filled 2023-04-11: qty 354, 1d supply, fill #0

## 2023-04-11 NOTE — Progress Notes (Signed)
 Chief Complaint:    Hospital follow-up  GI History: 53 year old male with a history of rheumatoid arthritis (on Enbrel), HTN, hidradenitis suppurativa, perirectal abscess  s/p I&D 07/2018, recently hospitalized 12/27-30 with abdominal pain, nausea/vomiting and diagnosed with terminal ileitis, SBO, and suspected newly diagnosed Crohn's Disease. - 03/23/2023: ER evaluation for abdominal pain, nausea/vomiting. - 03/23/2023: CT A/P: Skip areas of bowel wall thickening including in the duodenum, proximal jejunum, terminal ileum, with most pronounced inflammation at the terminal ileum with regional peritoneal thickening considered reactive peritonitis.  Treated with Cipro /Flagyl  x 7 days, dicyclomine  - 03/30/2023: Represented to the ER with ongoing abdominal pain and nausea/vomiting along with diarrhea.  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  No abscess.  New focal wall thickening in the distal ileum with dilated small bowel loops with fecalization concerning for partial SBO.  Chronic sacroiliitis. - Negative C. difficile, GI PCR panel - ESR 59, CRP 0.7 - Normal/negative hepatitis B (needs HBV vaccine series).  Negative QuantiFERON gold.  Normal TPMT activity - General Surgery and GI consulted and was treated medically with IV Zosyn , IV Solu-Medrol  with clinical improvement.  Was transition to Augmentin  and prednisone  with taper  For his history of RA, has been previously treated with Humira, then Rinvoq (not efficacious), then switched to Enbrel about 1.5 years ago.  Follows with Dr. Rosaline Salt at [facility].   Endoscopic History: - 01/10/2023: Colonoscopy (indication: Routine CRC screening; was otherwise asymptomatic): Normal, small terminal hemorrhoids  HPI:      [name] presenting to the Gastroenterology Clinic for hospital follow-up.  Hospital admission last month and essentially newly diagnosed Crohn's Disease.  Tolerating prednisone  without issue.  Currently on 30 mg/day.  Has occasional RLQ discomfort, but overall feels much better since hospital discharge.  Continues to slowly increase p.o. intake.  Does notice mild flare of discomfort with apples.  Repeat ESR earlier this week downtrending at 32.  CRP normal.  Calprotectin not completed as inpatient.  Has f/u with Rheumatology on 1/23. HS was better when on Abx with some mild inguinal sxs now.  He is planning follow-up with PCM for this.  Review of systems:     No chest pain, no SOB, no fevers, no urinary sx   Past Medical History:  Diagnosis Date   Arthritis    RA   Crohn disease (HCC)    Hip fracture (HCC)    Hypertension    Rheumatoid arthritis (HCC)     Patient's surgical history, family medical history, social history, medications and allergies were all reviewed in Epic    Current Outpatient Medications  Medication Sig Dispense Refill   ADVIL  200 MG CAPS Take 200-400 mg by mouth every 8 (eight) hours as needed (for pain).     amLODipine  (NORVASC ) 5 MG tablet Take 1 tablet (5 mg total) by mouth daily. 90 tablet 1   dicyclomine  (BENTYL ) 20 MG tablet Take 1 tablet (20 mg total) by mouth 2 (two) times daily as needed for spasms (abdominal cramping). 20 tablet 0   docusate sodium  (COLACE) 100 MG capsule Take 1 capsule (100 mg total) by mouth every 12 (twelve) hours. 60 capsule 0   ENBREL SURECLICK 50 MG/ML injection Inject 50 mg into the skin every Monday.     predniSONE  (DELTASONE ) 10 MG tablet Take 4 tablets (40  mg total) by mouth daily for 7 days, THEN 3 tablets (30 mg total) daily for 7 days, THEN 2 tablets (20 mg total) daily for 14 days, THEN 1 tablet (10 mg total) daily for 14 days THEN STOP 91 tablet 0   Psyllium (METAMUCIL SMOOTH TEXTURE PO) Take 3.4-4 g  by mouth See admin instructions. Mix 3.4-4 grams into eight ounces of water and drink by mouth once a day     No current facility-administered medications for this visit.    Physical Exam:     BP (!) 150/88   Pulse 63   Ht 5' 10 (1.778 m)   Wt 166 lb (75.3 kg)   BMI 23.82 kg/m   GENERAL:  Pleasant male in NAD PSYCH: : Cooperative, normal affect Musculoskeletal:  Normal muscle tone, normal strength NEURO: Alert and oriented x 3, no focal neurologic deficits   IMPRESSION and PLAN:    1) Crohn's Disease 53 year old male with recent hospital admission for what seems to be newly diagnosed Crohn's Disease.  Good clinical response to steroids.  Has a history of RA, previously treated with Humira, then Rinvoq (not efficacious), and reports had been largely in remission for the last 1.5 years with Enbrel.  Interestingly, did have CT in 07/2018 which showed large complex right perianal abscess, moderate wall thickening of the terminal ileum, bilateral sacroiliitis.  Another CT in 11/2021 with wall thickening of the distal ileum with upstream short segments of luminal narrowing suspicious for small bowel Crohn's disease.  At the time of his screening colonoscopy in 01/2023, he was otherwise asymptomatic.  Colon was normal-appearing but TI not intubated.  In-depth conversation today regarding pathophysiology and treatment options for Crohn's Disease, with plan for the following:  - Low residue diet - Discussed potentially starting on Remicade or Stelara, but would need to do so in concert with his Rheumatologist for concomitant management of RA.  Alternatively, could use Imuran  and add that to his current Enbrel -Continue prednisone  with slow taper -Will discuss treatment options with his Rheumatologist, Rosaline Salt -Plan for expedited EGD and colonoscopy.  I do not have any upcoming availability, so we will schedule to be done with Dr. Suzann - Will need HBV vaccine series  - Declines flu  vaccine. Will discuss Pneumonia vaccine with his PCM - Will reschedule f/u w/ his PCM  2) RA 3) Hidradenitis suppurativa -Keep follow-up appointment with Rheumatology later this month as scheduled -Will schedule follow-up with his PCM regarding HS - As above, considering medication modification for Crohn's disease.  Using Remicade may allow for concomitant treatment of RA and hidradenitis, and Stelara may allow for treatment of hidradenitis (not sure about RA though)  The indications, risks, and benefits of EGD and colonoscopy were explained to the patient in detail. Risks include but are not limited to bleeding, perforation, adverse reaction to medications, and cardiopulmonary compromise. Sequelae include but are not limited to the possibility of surgery, hospitalization, and mortality. The patient verbalized understanding and wished to proceed. All questions answered, referred to scheduler and bowel prep ordered. Further recommendations pending results of the exam.              Mark [name] ,DO, FACG 04/11/2023, 8:48 AM

## 2023-04-11 NOTE — Patient Instructions (Addendum)
 _______________________________________________________  If your blood pressure at your visit was 140/90 or greater, please contact your primary care physician to follow up on this.  If you are age 54 or younger, your body mass index should be between 19-25. Your Body mass index is 23.82 kg/m. If this is out of the aformentioned range listed, please consider follow up with your Primary Care Provider.  ________________________________________________________  The [facility] providers would like to encourage you to use MYCHART to communicate with providers for non-urgent requests or questions.  Due to long hold times on the telephone, sending your provider a message by [facility] may be a faster and more efficient way to get a response.  Please allow 48 business hours for a response.  Please remember that this is for non-urgent requests.  _______________________________________________________  Rosine have been scheduled for an endoscopy and colonoscopy. Please follow the written instructions given to you at your visit today.  Please pick up your prep supplies at the pharmacy within the next 1-3 days.  If you use inhalers (even only as needed), please bring them with you on the day of your procedure.  DO NOT TAKE 7 DAYS PRIOR TO TEST- Trulicity (dulaglutide) Ozempic, Wegovy (semaglutide) Mounjaro (tirzepatide) Bydureon Bcise (exanatide extended release)  DO NOT TAKE 1 DAY PRIOR TO YOUR TEST Rybelsus (semaglutide) Adlyxin (lixisenatide) Victoza (liraglutide) Byetta (exanatide) ___________________________________________________________________________  Due to recent changes in healthcare laws, you may see the results of your imaging and laboratory studies on MyChart before your provider has had a chance to review them.  We understand that in some cases there may be results that are confusing or concerning to you. Not all laboratory results come back in the same time frame and the provider may be  waiting for multiple results in order to interpret others.  Please give us  48 hours in order for your provider to thoroughly review all the results before contacting the office for clarification of your results.  It was a pleasure to see you today!  Mark [name], D.O.

## 2023-04-16 ENCOUNTER — Encounter: Payer: 59 | Admitting: Pediatrics

## 2023-04-16 NOTE — Progress Notes (Deleted)
 [facility] History and Physical   Primary Care Physician:  de [place], Quintin PARAS, MD   Reason for Procedure:  Abdominal pain, nausea, vomiting, abnormal CT showing evidence of bowel wall thickening of the duodenum, jejunum, terminal ileum concerning for Crohn's disease  Plan:    Diagnostic upper endoscopy and colonoscopy     HPI: Mark [name] is a 53 y.o. male undergoing diagnostic upper endoscopy and colonoscopy for symptoms of abdominal pain, nausea and vomiting.  He was recently hospitalized with CT imaging showing evidence of bowel wall thickening of the duodenum, jejunum, terminal ileum concerning for Crohn's disease.  Chart review indicates these changes have been longstanding dating back to 2020.  Last screening colonoscopy performed 01/10/2023 and was endoscopically normal.   Past Medical History:  Diagnosis Date   Arthritis    RA   Crohn disease (HCC)    Hip fracture (HCC)    Hypertension    Rheumatoid arthritis (HCC)     Past Surgical History:  Procedure Laterality Date   HYDRADENITIS EXCISION Bilateral 09/18/2018   Procedure: EXCISION BILATERAL AXILLARY HIDRADENITIS;  Surgeon: Vanderbilt Ned, MD;  Location: [facility];  Service: General;  Laterality: Bilateral;   INCISION AND DRAINAGE PERIRECTAL ABSCESS N/A 07/17/2018   Procedure: IRRIGATION AND DEBRIDEMENT PERIRECTAL ABSCESS;  Surgeon: Rubin Calamity, MD;  Location[facility];  Service: General;  Laterality: N/A;    Prior to Admission medications   Medication Sig Start Date End Date Taking? Authorizing Provider  ADVIL  200 MG CAPS Take 200-400 mg by mouth every 8 (eight) hours as needed (for pain).    [provider]  amLODipine  (NORVASC ) 5 MG tablet Take 1 tablet (5 mg total) by mouth daily. 04/02/23   Rojelio Nest, DO  dicyclomine  (BENTYL ) 20 MG tablet Take 1 tablet (20 mg total) by mouth 2 (two) times daily as needed for spasms (abdominal cramping). 03/23/23   Mesner, Selinda, MD   docusate sodium  (COLACE) 100 MG capsule Take 1 capsule (100 mg total) by mouth every 12 (twelve) hours. 03/23/23   Mesner, Selinda, MD  ENBREL SURECLICK 50 MG/ML injection Inject 50 mg into the skin every Monday. 06/15/18   [provider]  Na Sulfate-K Sulfate-Mg Sulf (SUPREP BOWEL PREP KIT) 17.5-3.13-1.6 GM/177ML SOLN Take 1 kit by mouth as directed. 04/11/23   Cirigliano, Vito V, DO  predniSONE  (DELTASONE ) 10 MG tablet Take 4 tablets (40 mg total) by mouth daily for 7 days, THEN 3 tablets (30 mg total) daily for 7 days, THEN 2 tablets (20 mg total) daily for 14 days, THEN 1 tablet (10 mg total) daily for 14 days THEN STOP 04/02/23 05/14/23  Rojelio Nest, DO  Psyllium (METAMUCIL SMOOTH TEXTURE PO) Take 3.4-4 g by mouth See admin instructions. Mix 3.4-4 grams into eight ounces of water and drink by mouth once a day    [provider]    Current Outpatient Medications  Medication Sig Dispense Refill   ADVIL  200 MG CAPS Take 200-400 mg by mouth every 8 (eight) hours as needed (for pain).     amLODipine  (NORVASC ) 5 MG tablet Take 1 tablet (5 mg total) by mouth daily. 90 tablet 1   dicyclomine  (BENTYL ) 20 MG tablet Take 1 tablet (20 mg total) by mouth 2 (two) times daily as needed for spasms (abdominal cramping). 20 tablet 0   docusate sodium  (COLACE) 100 MG capsule Take 1 capsule (100 mg total) by mouth every 12 (twelve) hours. 60 capsule 0   ENBREL SURECLICK 50 MG/ML  injection Inject 50 mg into the skin every Monday.     Na Sulfate-K Sulfate-Mg Sulf (SUPREP BOWEL PREP KIT) 17.5-3.13-1.6 GM/177ML SOLN Take 1 kit by mouth as directed. 354 mL 0   predniSONE  (DELTASONE ) 10 MG tablet Take 4 tablets (40 mg total) by mouth daily for 7 days, THEN 3 tablets (30 mg total) daily for 7 days, THEN 2 tablets (20 mg total) daily for 14 days, THEN 1 tablet (10 mg total) daily for 14 days THEN STOP 91 tablet 0   Psyllium (METAMUCIL SMOOTH TEXTURE PO) Take 3.4-4 g by mouth See admin instructions. Mix  3.4-4 grams into eight ounces of water and drink by mouth once a day     No current facility-administered medications for this visit.    Allergies as of 04/16/2023 - Review Complete 04/11/2023  Allergen Reaction Noted   Peanut-containing drug products Itching and Rash 07/17/2018    Family History  Problem Relation Age of Onset   Heart failure Mother    Aneurysm Father    Colon polyps Neg Hx    Colon cancer Neg Hx    Esophageal cancer Neg Hx    Stomach cancer Neg Hx    Rectal cancer Neg Hx     Social History   Socioeconomic History   Marital status: Divorced    Spouse name: Not on file   Number of children: Not on file   Years of education: Not on file   Highest education level: Not on file  Occupational History   Not on file  Tobacco Use   Smoking status: Some Days    Types: Cigars   Smokeless tobacco: Never  Vaping Use   Vaping status: Never Used  Substance and Sexual Activity   Alcohol use: Not Currently   Drug use: Not Currently    Frequency: 3.0 times per week   Sexual activity: Not on file  Other Topics Concern   Not on file  Social History Narrative   Not on file   Social Drivers of Health   Financial Resource Strain: Not on file  Food Insecurity: No Food Insecurity (03/30/2023)   Hunger Vital Sign    Worried About Running Out of Food in the Last Year: Never true    Ran Out of Food in the Last Year: Never true  Transportation Needs: No Transportation Needs (03/30/2023)   PRAPARE - Administrator, Civil Service (Medical): No    Lack of Transportation (Non-Medical): No  Physical Activity: Not on file  Stress: Not on file  Social Connections: Unknown (08/15/2021)   [facility] from [facility]   Social Network    Social Network: Not on file  Intimate Partner Violence: Not At Risk (03/30/2023)   Humiliation, Afraid, Rape, and Kick questionnaire    Fear of Current or Ex-Partner: No    Emotionally Abused: No    Physically  Abused: No    Sexually Abused: No    Review of Systems:  All other review of systems negative except as mentioned in the HPI.  Physical Exam: Vital signs There were no vitals taken for this visit.  General:   Alert,  Well-developed, well-nourished, pleasant and cooperative in NAD Airway:  Mallampati 2 Lungs:  Clear throughout to auscultation.   Heart:  Regular rate and rhythm; no murmurs, clicks, rubs,  or gallops. Abdomen:  Soft, nontender and nondistended. Normal bowel sounds.   Neuro/Psych:  Normal mood and affect. A and O x 3  Inocente Hausen,  MD [facility]

## 2023-04-26 ENCOUNTER — Other Ambulatory Visit (HOSPITAL_BASED_OUTPATIENT_CLINIC_OR_DEPARTMENT_OTHER): Payer: Self-pay

## 2023-04-26 ENCOUNTER — Other Ambulatory Visit (HOSPITAL_BASED_OUTPATIENT_CLINIC_OR_DEPARTMENT_OTHER): Payer: Self-pay | Admitting: Emergency Medicine

## 2023-05-03 ENCOUNTER — Ambulatory Visit (HOSPITAL_BASED_OUTPATIENT_CLINIC_OR_DEPARTMENT_OTHER): Payer: 59 | Admitting: Family Medicine

## 2023-05-03 ENCOUNTER — Encounter (HOSPITAL_BASED_OUTPATIENT_CLINIC_OR_DEPARTMENT_OTHER): Payer: Self-pay | Admitting: Family Medicine

## 2023-05-03 ENCOUNTER — Encounter (HOSPITAL_BASED_OUTPATIENT_CLINIC_OR_DEPARTMENT_OTHER): Payer: Self-pay | Admitting: *Deleted

## 2023-05-03 VITALS — BP 167/98 | HR 70 | Temp 98.0°F | Ht 70.0 in | Wt 166.8 lb

## 2023-05-03 DIAGNOSIS — I10 Essential (primary) hypertension: Secondary | ICD-10-CM | POA: Diagnosis not present

## 2023-05-03 DIAGNOSIS — K529 Noninfective gastroenteritis and colitis, unspecified: Secondary | ICD-10-CM

## 2023-05-03 NOTE — Progress Notes (Signed)
    Procedures performed today:    None.  Independent interpretation of notes and tests performed by another provider:   None.  Brief History, Exam, Impression, and Recommendations:    BP (!) 176/106 (BP Location: Right Arm, Patient Position: Sitting, Cuff Size: Large)   Pulse 70   Temp 98 F (36.7 C) (Oral)   Ht 5\' 10"  (1.778 m)   Wt 166 lb 12.8 oz (75.7 kg)   SpO2 99%   BMI 23.93 kg/m   Since last appointment with me, patient has had multiple ED visits and also had hospitalization related to IBD due to suspected Crohns.   Primary hypertension Assessment & Plan: Blood pressure elevated in office today, no significant change on recheck. Previously was taking amlodipine and valsartan, it does appear that valsartan was on discharge from hospital, although unclear as to why.  Not currently having any issues with amlodipine. For now, can continue with amlodipine.  We will need to monitor blood pressure closely and determine if valsartan will need to be resumed Recommend intermittent monitoring of blood pressure at home, DASH diet Plan for follow-up in about 6 weeks for monitoring  Orders: -     Amb ref to Medical Nutrition Therapy-MNT  Inflammatory bowel disease Assessment & Plan: Reviewed documentation from recent hospitalization with new diagnosis of suspected Crohn's disease.  Did recently have follow-up with rheumatology as well as GI earlier this month.  Recommend continued close follow-up with specialist regarding management.  Orders: -     Amb ref to Medical Nutrition Therapy-MNT  Return in about 6 weeks (around 06/14/2023) for hypertension.  Spent 33 minutes on this patient encounter, including preparation, chart review, face-to-face counseling with patient and coordination of care, and documentation of encounter   ___________________________________________ Deolinda Frid de [place], MD, ABFM, [facility] Primary Care and Sports Medicine [facility]

## 2023-05-03 NOTE — Assessment & Plan Note (Signed)
Blood pressure elevated in office today, no significant change on recheck. Previously was taking amlodipine and valsartan, it does appear that valsartan was on discharge from hospital, although unclear as to why.  Not currently having any issues with amlodipine. For now, can continue with amlodipine.  We will need to monitor blood pressure closely and determine if valsartan will need to be resumed Recommend intermittent monitoring of blood pressure at home, DASH diet Plan for follow-up in about 6 weeks for monitoring

## 2023-05-03 NOTE — Assessment & Plan Note (Signed)
Reviewed documentation from recent hospitalization with new diagnosis of suspected Crohn's disease.  Did recently have follow-up with rheumatology as well as GI earlier this month.  Recommend continued close follow-up with specialist regarding management.

## 2023-05-03 NOTE — Patient Instructions (Signed)
  Medication Instructions:  Your physician recommends that you continue on your current medications as directed. Please refer to the Current Medication list given to you today. --If you need a refill on any your medications before your next appointment, please call your pharmacy first. If no refills are authorized on file call the office.--   Follow-Up: Your next appointment:   Your physician recommends that you schedule a follow-up appointment in: 6 week follow up  with Dr. de [place]  You will receive a text message or e-mail with a link to a survey about your care and experience with [place] today! We would greatly appreciate your feedback!   Thanks for letting us be apart of your health journey!!  Primary Care and Sports Medicine   Dr. Ceasar Mons [place]   We encourage you to activate your patient portal called "MyChart".  Sign up information is provided on this After Visit Summary.  MyChart is used to connect with patients for Virtual Visits (Telemedicine).  Patients are able to view lab/test results, encounter notes, upcoming appointments, etc.  Non-urgent messages can be sent to your provider as well. To learn more about what you can do with MyChart, please visit --  [email or web address].

## 2023-05-07 ENCOUNTER — Other Ambulatory Visit (HOSPITAL_BASED_OUTPATIENT_CLINIC_OR_DEPARTMENT_OTHER): Payer: Self-pay

## 2023-05-07 ENCOUNTER — Telehealth: Payer: Self-pay

## 2023-05-07 DIAGNOSIS — K529 Noninfective gastroenteritis and colitis, unspecified: Secondary | ICD-10-CM

## 2023-05-07 DIAGNOSIS — R1031 Right lower quadrant pain: Secondary | ICD-10-CM

## 2023-05-07 DIAGNOSIS — Z1211 Encounter for screening for malignant neoplasm of colon: Secondary | ICD-10-CM

## 2023-05-07 DIAGNOSIS — K50919 Crohn's disease, unspecified, with unspecified complications: Secondary | ICD-10-CM

## 2023-05-07 DIAGNOSIS — K641 Second degree hemorrhoids: Secondary | ICD-10-CM

## 2023-05-07 NOTE — Telephone Encounter (Signed)
Left message for patient to return call to our office to schedule 3 month follow up with Dr Barron Alvine in April.  Will continue efforts.

## 2023-05-07 NOTE — Telephone Encounter (Signed)
PT returned call. Scheduled follow up with Dr. Barron Alvine for 4/11 at 220pm. He also advised that he need to have a refill for prednisone sent to [facility] pharmacy. Please advise.

## 2023-05-07 NOTE — Telephone Encounter (Signed)
-----   Message from [facility] Cal[place] R sent at 04/20/2023  1:11 PM EST ----- Regarding: [id number] 77month, CD, RLQ pain, nhr

## 2023-05-08 ENCOUNTER — Other Ambulatory Visit (HOSPITAL_BASED_OUTPATIENT_CLINIC_OR_DEPARTMENT_OTHER): Payer: Self-pay

## 2023-05-08 NOTE — Telephone Encounter (Signed)
Patient is currently scheduled for 4/11. Dr Carloyn Manner can he have a script sent of Prednisone to his pharmacy? See note below.

## 2023-05-09 ENCOUNTER — Other Ambulatory Visit (HOSPITAL_BASED_OUTPATIENT_CLINIC_OR_DEPARTMENT_OTHER): Payer: Self-pay

## 2023-05-09 ENCOUNTER — Other Ambulatory Visit: Payer: Self-pay | Admitting: Gastroenterology

## 2023-05-09 MED FILL — Dicyclomine HCl Tab 20 MG: ORAL | 45 days supply | Qty: 90 | Fill #0 | Status: AC

## 2023-05-09 NOTE — Telephone Encounter (Signed)
 Returned call to the patient who stated that he did complete prednisone  taper, but in the past 4 to 5 days he has started having lower right sided abd pain described as a pushing pain.  Patient stated he has been having 2 to 3 regular bowel movements, but last night he vomited and is worried that he could have another blockage.  Patient has not had a fever.  This is the reason he would like to have more prednisone .  Patient stated that he saw Rosaline at Rheumatology on 04-26-23 and is currently waiting for Remicade to be approved.   He stated the reason he canceled his EGD and Colon was because he had red popsicles the day before the procedure.  Would you like me to reschedule?  Please advise.

## 2023-05-09 NOTE — Telephone Encounter (Signed)
 Understood. Yes, concern for active Crohns. Restart Prednisone  at 40 mg/day x7 days, then taper by 10 mg every 7 days while awaiting Remicade approval. Check fecal calprotectin, ESR, CRP. If increasing sxs, fever, not tolerating PO intake, etc, ER return precautions.   Yes, please schedule for EGD/Colo. Can schedule f/u with me as well.

## 2023-05-09 NOTE — Telephone Encounter (Signed)
 Patient returned your call, please advise.

## 2023-05-09 NOTE — Telephone Encounter (Signed)
 OK to refill Bentyl ?  He was given this at the ER in Dec 24?

## 2023-05-09 NOTE — Telephone Encounter (Signed)
 Left message for patient to return to further discuss. Will continue efforts.

## 2023-05-09 NOTE — Telephone Encounter (Signed)
 Patient returning phone call. Please advise, thank you.

## 2023-05-10 ENCOUNTER — Other Ambulatory Visit (HOSPITAL_BASED_OUTPATIENT_CLINIC_OR_DEPARTMENT_OTHER): Payer: Self-pay

## 2023-05-10 MED ORDER — PREDNISONE 10 MG PO TABS
ORAL_TABLET | ORAL | 0 refills | Status: DC
  Filled 2023-05-10: qty 100, 28d supply, fill #0

## 2023-05-10 NOTE — Telephone Encounter (Signed)
 Patient advised that he should restart Prednisone  at 40 mg/day x7 days, then taper by 10 mg every 7 days while awaiting Remicade approval. Rx for Prednisone  sent to pharmacy. Patient instructed to return to our lab to check fecal calprotectin, ESR, CRP.  Patient advised if increasing symptoms, fever, not tolerating by mouth intake, he should proceed to nearest ER for further evaluation.   Patient scheduled to have EGD/Colon in the [facility] on 06-12-23 with Dr Suzann.  Patient agreed to plan and verbalized understanding.  No further questions.

## 2023-05-11 ENCOUNTER — Other Ambulatory Visit (INDEPENDENT_AMBULATORY_CARE_PROVIDER_SITE_OTHER): Payer: 59

## 2023-05-11 DIAGNOSIS — K50919 Crohn's disease, unspecified, with unspecified complications: Secondary | ICD-10-CM

## 2023-05-11 DIAGNOSIS — R1031 Right lower quadrant pain: Secondary | ICD-10-CM | POA: Diagnosis not present

## 2023-05-11 DIAGNOSIS — K529 Noninfective gastroenteritis and colitis, unspecified: Secondary | ICD-10-CM | POA: Diagnosis not present

## 2023-05-11 DIAGNOSIS — Z1211 Encounter for screening for malignant neoplasm of colon: Secondary | ICD-10-CM

## 2023-05-11 DIAGNOSIS — K641 Second degree hemorrhoids: Secondary | ICD-10-CM

## 2023-05-11 LAB — C-REACTIVE PROTEIN: CRP: 2.9 mg/dL (ref 0.5–20.0)

## 2023-05-11 LAB — SEDIMENTATION RATE: Sed Rate: 86 mm/h — ABNORMAL HIGH (ref 0–20)

## 2023-05-14 ENCOUNTER — Ambulatory Visit: Payer: 59 | Admitting: Dietician

## 2023-05-15 ENCOUNTER — Encounter (HOSPITAL_BASED_OUTPATIENT_CLINIC_OR_DEPARTMENT_OTHER): Payer: Self-pay | Admitting: Emergency Medicine

## 2023-05-15 ENCOUNTER — Inpatient Hospital Stay (HOSPITAL_BASED_OUTPATIENT_CLINIC_OR_DEPARTMENT_OTHER)
Admission: EM | Admit: 2023-05-15 | Discharge: 2023-05-20 | DRG: 385 | Disposition: A | Discharge status: Home / Self Care | Payer: 59 | Attending: Family Medicine | Admitting: Family Medicine

## 2023-05-15 ENCOUNTER — Other Ambulatory Visit: Payer: Self-pay

## 2023-05-15 ENCOUNTER — Emergency Department (HOSPITAL_BASED_OUTPATIENT_CLINIC_OR_DEPARTMENT_OTHER): Payer: 59

## 2023-05-15 DIAGNOSIS — E876 Hypokalemia: Secondary | ICD-10-CM | POA: Diagnosis present

## 2023-05-15 DIAGNOSIS — K5903 Drug induced constipation: Secondary | ICD-10-CM | POA: Diagnosis not present

## 2023-05-15 DIAGNOSIS — K651 Peritoneal abscess: Secondary | ICD-10-CM | POA: Diagnosis present

## 2023-05-15 DIAGNOSIS — F17290 Nicotine dependence, other tobacco product, uncomplicated: Secondary | ICD-10-CM | POA: Diagnosis present

## 2023-05-15 DIAGNOSIS — N281 Cyst of kidney, acquired: Secondary | ICD-10-CM | POA: Diagnosis present

## 2023-05-15 DIAGNOSIS — L732 Hidradenitis suppurativa: Secondary | ICD-10-CM | POA: Diagnosis not present

## 2023-05-15 DIAGNOSIS — K649 Unspecified hemorrhoids: Secondary | ICD-10-CM | POA: Diagnosis present

## 2023-05-15 DIAGNOSIS — M069 Rheumatoid arthritis, unspecified: Secondary | ICD-10-CM | POA: Diagnosis present

## 2023-05-15 DIAGNOSIS — Z91010 Allergy to peanuts: Secondary | ICD-10-CM | POA: Diagnosis not present

## 2023-05-15 DIAGNOSIS — N200 Calculus of kidney: Secondary | ICD-10-CM | POA: Diagnosis not present

## 2023-05-15 DIAGNOSIS — Z79899 Other long term (current) drug therapy: Secondary | ICD-10-CM | POA: Diagnosis not present

## 2023-05-15 DIAGNOSIS — Z8719 Personal history of other diseases of the digestive system: Secondary | ICD-10-CM | POA: Diagnosis not present

## 2023-05-15 DIAGNOSIS — B9620 Unspecified Escherichia coli [E. coli] as the cause of diseases classified elsewhere: Secondary | ICD-10-CM | POA: Diagnosis present

## 2023-05-15 DIAGNOSIS — R1031 Right lower quadrant pain: Principal | ICD-10-CM | POA: Diagnosis present

## 2023-05-15 DIAGNOSIS — I10 Essential (primary) hypertension: Secondary | ICD-10-CM | POA: Diagnosis not present

## 2023-05-15 DIAGNOSIS — Z8249 Family history of ischemic heart disease and other diseases of the circulatory system: Secondary | ICD-10-CM | POA: Diagnosis not present

## 2023-05-15 DIAGNOSIS — T50995A Adverse effect of other drugs, medicaments and biological substances, initial encounter: Secondary | ICD-10-CM | POA: Diagnosis not present

## 2023-05-15 DIAGNOSIS — M461 Sacroiliitis, not elsewhere classified: Secondary | ICD-10-CM | POA: Diagnosis present

## 2023-05-15 DIAGNOSIS — K5090 Crohn's disease, unspecified, without complications: Secondary | ICD-10-CM | POA: Diagnosis present

## 2023-05-15 DIAGNOSIS — K5720 Diverticulitis of large intestine with perforation and abscess without bleeding: Secondary | ICD-10-CM | POA: Diagnosis present

## 2023-05-15 DIAGNOSIS — Z885 Allergy status to narcotic agent status: Secondary | ICD-10-CM

## 2023-05-15 DIAGNOSIS — B961 Klebsiella pneumoniae [K. pneumoniae] as the cause of diseases classified elsewhere: Secondary | ICD-10-CM | POA: Diagnosis present

## 2023-05-15 DIAGNOSIS — K50014 Crohn's disease of small intestine with abscess: Principal | ICD-10-CM | POA: Diagnosis present

## 2023-05-15 DIAGNOSIS — D72829 Elevated white blood cell count, unspecified: Secondary | ICD-10-CM | POA: Diagnosis present

## 2023-05-15 DIAGNOSIS — K50914 Crohn's disease, unspecified, with abscess: Secondary | ICD-10-CM | POA: Diagnosis present

## 2023-05-15 LAB — CBC WITH DIFFERENTIAL/PLATELET
Abs Immature Granulocytes: 0.06 10*3/uL (ref 0.00–0.07)
Basophils Absolute: 0 10*3/uL (ref 0.0–0.1)
Basophils Relative: 0 %
Eosinophils Absolute: 0.1 10*3/uL (ref 0.0–0.5)
Eosinophils Relative: 1 %
HCT: 40.4 % (ref 39.0–52.0)
Hemoglobin: 13.3 g/dL (ref 13.0–17.0)
Immature Granulocytes: 0 %
Lymphocytes Relative: 24 %
Lymphs Abs: 3.9 10*3/uL (ref 0.7–4.0)
MCH: 29.8 pg (ref 26.0–34.0)
MCHC: 32.9 g/dL (ref 30.0–36.0)
MCV: 90.4 fL (ref 80.0–100.0)
Monocytes Absolute: 1.4 10*3/uL — ABNORMAL HIGH (ref 0.1–1.0)
Monocytes Relative: 9 %
Neutro Abs: 11 10*3/uL — ABNORMAL HIGH (ref 1.7–7.7)
Neutrophils Relative %: 66 %
Platelets: 465 10*3/uL — ABNORMAL HIGH (ref 150–400)
RBC: 4.47 MIL/uL (ref 4.22–5.81)
RDW: 16.3 % — ABNORMAL HIGH (ref 11.5–15.5)
WBC: 16.4 10*3/uL — ABNORMAL HIGH (ref 4.0–10.5)
nRBC: 0 % (ref 0.0–0.2)

## 2023-05-15 LAB — COMPREHENSIVE METABOLIC PANEL
ALT: 17 U/L (ref 0–44)
AST: 11 U/L — ABNORMAL LOW (ref 15–41)
Albumin: 3.6 g/dL (ref 3.5–5.0)
Alkaline Phosphatase: 90 U/L (ref 38–126)
Anion gap: 8 (ref 5–15)
BUN: 19 mg/dL (ref 6–20)
CO2: 28 mmol/L (ref 22–32)
Calcium: 9.2 mg/dL (ref 8.9–10.3)
Chloride: 103 mmol/L (ref 98–111)
Creatinine, Ser: 0.99 mg/dL (ref 0.61–1.24)
GFR, Estimated: >60 mL/min (ref >60)
Glucose, Bld: 99 mg/dL (ref 70–99)
Potassium: 3.4 mmol/L — ABNORMAL LOW (ref 3.5–5.1)
Sodium: 139 mmol/L (ref 135–145)
Total Bilirubin: 0.4 mg/dL (ref 0.0–1.2)
Total Protein: 7 g/dL (ref 6.5–8.1)

## 2023-05-15 LAB — LIPASE, BLOOD: Lipase: 13 U/L (ref 11–51)

## 2023-05-15 LAB — PHOSPHORUS: Phosphorus: 2.9 mg/dL (ref 2.5–4.6)

## 2023-05-15 LAB — MAGNESIUM: Magnesium: 1.9 mg/dL (ref 1.7–2.4)

## 2023-05-15 MED ORDER — ENOXAPARIN SODIUM 40 MG/0.4ML IJ SOSY
40.0000 mg | PREFILLED_SYRINGE | Freq: Every day | INTRAMUSCULAR | Status: DC
  Administered 2023-05-15 – 2023-05-19 (×5): 40 mg via SUBCUTANEOUS
  Filled 2023-05-15 (×5): qty 0.4

## 2023-05-15 MED ORDER — KCL-LACTATED RINGERS-D5W 20 MEQ/L IV SOLN
INTRAVENOUS | Status: AC
  Filled 2023-05-15 (×3): qty 1000

## 2023-05-15 MED ORDER — HYDROMORPHONE HCL 1 MG/ML IJ SOLN
1.0000 mg | Freq: Once | INTRAMUSCULAR | Status: AC
  Administered 2023-05-15: 1 mg via INTRAVENOUS
  Filled 2023-05-15: qty 1

## 2023-05-15 MED ORDER — MORPHINE SULFATE (PF) 4 MG/ML IV SOLN
4.0000 mg | Freq: Once | INTRAVENOUS | Status: AC
  Administered 2023-05-15: 4 mg via INTRAVENOUS
  Filled 2023-05-15: qty 1

## 2023-05-15 MED ORDER — IOHEXOL 300 MG/ML  SOLN
100.0000 mL | Freq: Once | INTRAMUSCULAR | Status: AC | PRN
  Administered 2023-05-15: 85 mL via INTRAVENOUS

## 2023-05-15 MED ORDER — POTASSIUM CHLORIDE 10 MEQ/100ML IV SOLN
10.0000 meq | Freq: Once | INTRAVENOUS | Status: AC
  Administered 2023-05-15: 10 meq via INTRAVENOUS
  Filled 2023-05-15: qty 100

## 2023-05-15 MED ORDER — ONDANSETRON HCL 4 MG/2ML IJ SOLN
4.0000 mg | Freq: Four times a day (QID) | INTRAMUSCULAR | Status: DC | PRN
  Administered 2023-05-15: 4 mg via INTRAVENOUS
  Filled 2023-05-15: qty 2

## 2023-05-15 MED ORDER — ONDANSETRON HCL 4 MG/2ML IJ SOLN
4.0000 mg | Freq: Once | INTRAMUSCULAR | Status: AC
  Administered 2023-05-15: 4 mg via INTRAVENOUS
  Filled 2023-05-15: qty 2

## 2023-05-15 MED ORDER — PIPERACILLIN-TAZOBACTAM 3.375 G IVPB 30 MIN
3.3750 g | Freq: Once | INTRAVENOUS | Status: AC
  Administered 2023-05-15: 3.375 g via INTRAVENOUS
  Filled 2023-05-15: qty 50

## 2023-05-15 MED ORDER — METHYLPREDNISOLONE SODIUM SUCC 40 MG IJ SOLR
40.0000 mg | Freq: Two times a day (BID) | INTRAMUSCULAR | Status: DC
  Administered 2023-05-16 (×2): 40 mg via INTRAVENOUS
  Filled 2023-05-15 (×2): qty 1

## 2023-05-15 MED ORDER — PIPERACILLIN-TAZOBACTAM 3.375 G IVPB
3.3750 g | Freq: Three times a day (TID) | INTRAVENOUS | Status: DC
  Administered 2023-05-15 – 2023-05-20 (×15): 3.375 g via INTRAVENOUS
  Filled 2023-05-15 (×15): qty 50

## 2023-05-15 MED ORDER — SODIUM CHLORIDE 0.9 % IV BOLUS
1000.0000 mL | Freq: Once | INTRAVENOUS | Status: AC
  Administered 2023-05-15: 1000 mL via INTRAVENOUS

## 2023-05-15 MED ORDER — MORPHINE SULFATE (PF) 2 MG/ML IV SOLN
2.0000 mg | INTRAVENOUS | Status: DC | PRN
  Administered 2023-05-15 – 2023-05-17 (×10): 2 mg via INTRAVENOUS
  Filled 2023-05-15 (×11): qty 1

## 2023-05-15 MED ORDER — METHYLPREDNISOLONE SODIUM SUCC 125 MG IJ SOLR
62.5000 mg | Freq: Once | INTRAMUSCULAR | Status: AC
  Administered 2023-05-15: 62.5 mg via INTRAVENOUS
  Filled 2023-05-15: qty 2

## 2023-05-15 MED ORDER — SODIUM CHLORIDE 0.9 % IV SOLN: INTRAVENOUS | Status: AC

## 2023-05-15 NOTE — ED Notes (Signed)
Thomas at [facility] called for transport 18:22

## 2023-05-15 NOTE — ED Notes (Signed)
ED Provider at bedside.

## 2023-05-15 NOTE — ED Provider Notes (Signed)
[facility] AT [facility] Provider Note   CSN: [id number] Arrival date & time: 05/15/23  4401     History  Chief Complaint  Patient presents with   Abdominal Pain    Mark [name] is a 53 y.o. male.  The history is provided by the patient.  Abdominal Pain He has history of hypertension, rheumatoid arthritis, Crohn's disease and comes in because of right lower quadrant pain similar to what he had with presentation Crohn's disease in December.  He was discharged from the hospital on a prednisone taper and was doing well until about 1 week ago when he had recurrence of right lower quadrant pain which has been getting worse.  5 days ago, he was started on a new prednisone taper and he is currently on 40 mg a day, but pain has not improved.  He has had some nausea, but is not currently having nausea.  He denies any diarrhea.  Denies fever or chills.   Home Medications Prior to Admission medications   Medication Sig Start Date End Date Taking? Authorizing Provider  ADVIL 200 MG CAPS Take 200-400 mg by mouth every 8 (eight) hours as needed (for pain). Patient not taking: Reported on 05/03/2023    [provider]  amLODipine (NORVASC) 5 MG tablet Take 1 tablet (5 mg total) by mouth daily. 04/02/23   Noralee Stain, DO  benzoyl peroxide (BREVOXYL) 4 % external liquid 1 application Externally Once a day Patient not taking: Reported on 05/03/2023 01/30/20   [provider]  dicyclomine (BENTYL) 20 MG tablet Take 1 tablet (20 mg total) by mouth 2 (two) times daily as needed for spasms (abdominal cramping). 05/09/23   Cirigliano, Vito V, DO  docusate sodium (COLACE) 100 MG capsule Take 1 capsule (100 mg total) by mouth every 12 (twelve) hours. Patient not taking: Reported on 05/03/2023 03/23/23   Mesner, Barbara Cower, MD  ENBREL SURECLICK 50 MG/ML injection Inject 50 mg into the skin every Monday. 06/15/18   [provider]  Na Sulfate-K Sulfate-Mg  Sulf (SUPREP BOWEL PREP KIT) 17.5-3.13-1.6 GM/177ML SOLN Take 1 kit by mouth as directed. Patient not taking: Reported on 05/03/2023 04/11/23   Cirigliano, Vito V, DO  predniSONE (DELTASONE) 10 MG tablet Take 4 tablets (40 mg total) by mouth daily with breakfast for 7 days, THEN 3 tablets (30 mg total) daily with breakfast for 7 days, THEN 2 tablets (20 mg total) daily with breakfast for 7 days, THEN 1 tablet (10 mg total) daily with breakfast for 7 days. While waiting Remicade approval. 05/10/23 06/07/23  Cirigliano, Verlin Dike, DO  Psyllium (METAMUCIL SMOOTH TEXTURE PO) Take 3.4-4 g by mouth See admin instructions. Mix 3.4-4 grams into eight ounces of water and drink by mouth once a day Patient not taking: Reported on 05/03/2023    [provider]      Allergies    Peanut-containing drug products    Review of Systems   Review of Systems  Gastrointestinal:  Positive for abdominal pain.  All other systems reviewed and are negative.   Physical Exam Updated Vital Signs BP (!) 199/92   Pulse 72   Temp 98.8 F (37.1 C) (Oral)   Resp 18   Ht 5\' 10"  (1.778 m)   Wt 72.6 kg   SpO2 100%   BMI 22.96 kg/m  Physical Exam Vitals and nursing note reviewed.   53 year old male, resting comfortably and in no acute distress. Vital signs are significant for elevated blood  pressure. Oxygen saturation is 100%, which is normal. Head is normocephalic and atraumatic. PERRLA, EOMI. Oropharynx is clear. Neck is nontender and supple without adenopathy. Lungs are clear without rales, wheezes, or rhonchi. Chest is nontender. Heart has regular rate and rhythm without murmur. Abdomen is soft, flat, with marked right lower quadrant tenderness which is fairly well localized.  There is voluntary guarding but no rebound tenderness. Extremities have no cyanosis or edema, full range of motion is present. Skin is warm and dry without rash. Neurologic: Mental status is normal, cranial nerves are intact, moves all  extremities equally.  ED Results / Procedures / Treatments   Labs (all labs ordered are listed, but only abnormal results are displayed) Labs Reviewed  LIPASE, BLOOD  COMPREHENSIVE METABOLIC PANEL  CBC  URINALYSIS, ROUTINE W REFLEX MICROSCOPIC  CBC WITH DIFFERENTIAL/PLATELET    EKG None  Radiology No results found.  Procedures Procedures    Medications Ordered in ED Medications - No data to display  ED Course/ Medical Decision Making/ A&P                                 Medical Decision Making Amount and/or Complexity of Data Reviewed Labs: ordered. Radiology: ordered.  Risk Prescription drug management.   Right lower quadrant pain in patient recently diagnosed with Crohn's.  This condition entails a wide variety of treatment options and has a high risk of morbidity and complications.  Differential diagnosis includes, but is not limited to, Crohn's exacerbation, appendicitis, diverticulitis, urolithiasis, pyelonephritis.  I have ordered IV fluids, morphine for pain, ondansetron for nausea.  I have ordered laboratory workup of CBC, comprehensive metabolic panel, lipase, urinalysis.  I have ordered CT of abdomen and pelvis.  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  Case is signed out to Dr. Wallace Cullens, oncoming physician.  Final Clinical Impression(s) / ED Diagnoses Final diagnoses:  RLQ abdominal pain  Crohn's disease without complication, unspecified gastrointestinal tract location [facility])    Rx / DC Orders ED Discharge Orders     None         Dione Booze, MD 05/15/23 [phone]

## 2023-05-15 NOTE — ED Triage Notes (Signed)
Patient presents with diffuse abd pain since Thursday. States "they are testing me for Crohns". Patient denies n/v. Reports taking prescribed Bentyl and Prednisone with no relief

## 2023-05-15 NOTE — ED Provider Notes (Signed)
  Provider Note MRN:  [id number]  Arrival date & time: 05/16/23    ED Course and Medical Decision Making  Assumed care from Dr Preston Fleeting at shift change.  See note from prior team for complete details, in brief:  53 yo male hx HTN, RA, chrohn's ileitis, IBD here w/ abd pain; worsened over the last 5 days He recently completed prednisone x5 days No rectal bleeding  Dr Barron Alvine GI  Plan per prior physician f/u labs/imaging  Clinical Course as of 05/16/23 1556  Tue May 15, 2023  0833 Progression of IBD, terminal ileitis, anterior abscess intra-abd 2.6 x 4.5 x 5.9 cm per radiology. Start abx, will d/w GI (f/w [facility] Cirgliano) [SG]    Clinical Course User Index [SG] Sloan Leiter, DO   Will d/w GI Will plan admission  CRITICAL CARE Performed by: Sloan Leiter   Total critical care time: 30 minutes  Critical care time was exclusive of separately billable procedures and treating other patients.  Critical care was necessary to treat or prevent imminent or life-threatening deterioration.  Critical care was time spent personally by me on the following activities: development of treatment plan with patient and/or surrogate as well as nursing, discussions with consultants, evaluation of patient's response to treatment, examination of patient, obtaining history from patient or surrogate, ordering and performing treatments and interventions, ordering and review of laboratory studies, ordering and review of radiographic studies, pulse oximetry and re-evaluation of patient's condition. Intra-abdominal abscess  Procedures  Final Clinical Impressions(s) / ED Diagnoses     ICD-10-CM   1. RLQ abdominal pain  R10.31     2. Intra-abdominal abscess (HCC)  K65.1     3. Crohn's disease with abscess, unspecified gastrointestinal tract location [facility])  K50.914       ED Discharge Orders     None       Discharge Instructions   None        Sloan Leiter, DO 05/16/23 1556

## 2023-05-15 NOTE — H&P (Addendum)
History and Physical    Patient: Mark [name] UJW:[id number] DOB: 23-Jun-1970 DOA: 05/15/2023 DOS: the patient was seen and examined on 05/15/2023 PCP: de Peru, Buren Kos, MD  Patient coming from: Home  Chief Complaint:  Chief Complaint  Patient presents with   Abdominal Pain   HPI: Primo Innis is a 53 y.o. male with medical history significant of chron's disease, essential hypertension, rheumatoid arthritis, who is being managed by GI in the outpatient setting with previous surgery involving perirectal abscess.  Patient was seen and evaluated.  He has been having significant abdominal pain with nausea.  He was recently seen by GI and given prednisone taper which he has almost completed.  After completion he started having symptoms again.  Went to med [employer] [facility] where he was seen and evaluated.  At this point patient appears to have flareup of the Crohn's disease.  Patient being admitted for further evaluation and treatment.  CT confirmed terminal ileitis anterior abscess and progression of IBD.  Review of Systems: As mentioned in the history of present illness. All other systems reviewed and are negative. Past Medical History:  Diagnosis Date   Arthritis    RA   Crohn disease (HCC)    Hip fracture (HCC)    Hypertension    Rheumatoid arthritis (HCC)    Past Surgical History:  Procedure Laterality Date   HYDRADENITIS EXCISION Bilateral 09/18/2018   Procedure: EXCISION BILATERAL AXILLARY HIDRADENITIS;  Surgeon: Harriette Bouillon, MD;  Location: [facility];  Service: General;  Laterality: Bilateral;   INCISION AND DRAINAGE PERIRECTAL ABSCESS N/A 07/17/2018   Procedure: IRRIGATION AND DEBRIDEMENT PERIRECTAL ABSCESS;  Surgeon: Axel Filler, MD;  Location: [facility];  Service: General;  Laterality: N/A;   Social History:  reports that he has been smoking cigars. He has never used smokeless tobacco. He reports that he does not currently use alcohol. He reports that  he does not currently use drugs. Frequency: 3.00 times per week.  Allergies  Allergen Reactions   Oxycodone Other (See Comments)    "Causes severe abdominal pain"   Peanut-Containing Drug Products Itching and Rash    Family History  Problem Relation Age of Onset   Heart failure Mother    Aneurysm Father    Colon polyps Neg Hx    Colon cancer Neg Hx    Esophageal cancer Neg Hx    Stomach cancer Neg Hx    Rectal cancer Neg Hx     Prior to Admission medications   Medication Sig Start Date End Date Taking? Authorizing Provider  acetaminophen (TYLENOL) 500 MG tablet Take 500 mg by mouth every 6 (six) hours as needed.   Yes [provider]  amLODipine (NORVASC) 5 MG tablet Take 1 tablet (5 mg total) by mouth daily. 04/02/23  Yes Noralee Stain, DO  dicyclomine (BENTYL) 20 MG tablet Take 1 tablet (20 mg total) by mouth 2 (two) times daily as needed for spasms (abdominal cramping). 05/09/23  Yes Cirigliano, Vito V, DO  ENBREL SURECLICK 50 MG/ML injection Inject 50 mg into the skin every Monday. 06/15/18  Yes [provider]  predniSONE (DELTASONE) 10 MG tablet Take 4 tablets (40 mg total) by mouth daily with breakfast for 7 days, THEN 3 tablets (30 mg total) daily with breakfast for 7 days, THEN 2 tablets (20 mg total) daily with breakfast for 7 days, THEN 1 tablet (10 mg total) daily with breakfast for 7 days. While waiting Remicade approval. 05/10/23 06/07/23 Yes Cirigliano, Verlin Dike,  DO  docusate sodium (COLACE) 100 MG capsule Take 1 capsule (100 mg total) by mouth every 12 (twelve) hours. Patient not taking: Reported on 05/03/2023 03/23/23   Mesner, Barbara Cower, MD  Na Sulfate-K Sulfate-Mg Sulf (SUPREP BOWEL PREP KIT) 17.5-3.13-1.6 GM/177ML SOLN Take 1 kit by mouth as directed. Patient not taking: Reported on 05/03/2023 04/11/23   Shellia Cleverly, DO    Physical Exam: Vitals:   05/15/23 1630 05/15/23 1649 05/15/23 1900 05/15/23 1942  BP: (!) 153/82  (!) 181/88 (!) 181/94  Pulse: 60   67 (!) 53  Resp: 17  (!) 21 20  Temp:  98.5 F (36.9 C)  98.5 F (36.9 C)  TempSrc:  Oral  Oral  SpO2: 100%  100% 96%  Weight:      Height:       Constitutional: NAD, calm, comfortable Eyes: PERRL, lids and conjunctivae normal ENMT: Mucous membranes are moist. Posterior pharynx clear of any exudate or lesions.Normal dentition.  Neck: normal, supple, no masses, no thyromegaly Respiratory: clear to auscultation bilaterally, no wheezing, no crackles. Normal respiratory effort. No accessory muscle use.  Cardiovascular: Regular rate and rhythm, no murmurs / rubs / gallops. No extremity edema. 2+ pedal pulses. No carotid bruits.  Abdomen: Abdominal distention with mild tenderness, no masses palpated. No hepatosplenomegaly. Bowel sounds positive.  Musculoskeletal: Good range of motion, no joint swelling or tenderness, Skin: no rashes, lesions, ulcers. No induration Neurologic: CN 2-12 grossly intact. Sensation intact, DTR normal. Strength 5/5 in all 4.  Psychiatric: Normal judgment and insight. Alert and oriented x 3. Normal mood  Data Reviewed:  Temperature at 98.1, blood pressure 201/85, pulse 59, respirate 20 and oxygen sat 96% on room air, white count 16.4, potassium 3.4 and platelets 465.  CT abdomen pelvis showed interval progression of patient's known inflammatory bowel disease involving the terminal ileum with a walled off abscess in the right lower quadrant anteriorly.  There is multiple other observations such as bilateral renal cysts 2 mm nonobstructing calculus in the left kidney upper pole.  Assessment and Plan:  #1 acute flare of chron's disease: CT showed some walled off abscess.  Patient will be initiated on IV Zosyn, IV steroids.  GI consult and possible surgical consult.  Continue monitor.  Clear liquid diet  #2 hypokalemia: Will replete potassium.  #3 leukocytosis: Secondary to inflammatory process.  Continue to monitor.  #4 uncontrolled hypertension: Will use IV  medications as needed  #5 nephrolithiasis: Urology follow-up outpatient.  #6 history of rheumatoid arthritis: Continue home regimen    Advance Care Planning:   Code Status: Full Code   Consults: Gastroenterology, Dr Rhea Belton  Family Communication: No family at bedside  Severity of Illness: The appropriate patient status for this patient is INPATIENT. Inpatient status is judged to be reasonable and necessary in order to provide the required intensity of service to ensure the patient's safety. The patient's presenting symptoms, physical exam findings, and initial radiographic and laboratory data in the context of their chronic comorbidities is felt to place them at high risk for further clinical deterioration. Furthermore, it is not anticipated that the patient will be medically stable for discharge from the hospital within 2 midnights of admission.   * I certify that at the point of admission it is my clinical judgment that the patient will require inpatient hospital care spanning beyond 2 midnights from the point of admission due to high intensity of service, high risk for further deterioration and high frequency of surveillance required.*  Author:  Lonia Blood, MD 05/15/2023 10:39 PM  For on call review [email or web address].

## 2023-05-15 NOTE — Progress Notes (Signed)
Plan of Care Note for accepted transfer   Patient: Mark [name] MRN: [id number]   DOA: 05/15/2023  Facility requesting transfer:  Requesting Provider: Tanda Rockers, DO. Reason for transfer: Exacerbation of Crohn's disease. Facility course:  53 year old male who presented to the emergency department with exacerbation of Crohn's disease.  The case was discussed with [facility].  They will see him when he arrives to either [place] campus.  Plan of care: The patient is accepted for admission to Med-surg  unit, at [facility].  Author: Bobette Mo, MD 05/15/2023  Check [email or web address] for on-call coverage.  Nursing staff, Please call [facility] Admits & Consults System-Wide number on Amion as soon as patient's arrival, so appropriate admitting provider can evaluate the pt.

## 2023-05-16 ENCOUNTER — Observation Stay (HOSPITAL_COMMUNITY): Payer: 59

## 2023-05-16 ENCOUNTER — Other Ambulatory Visit (HOSPITAL_COMMUNITY): Payer: 59

## 2023-05-16 DIAGNOSIS — L732 Hidradenitis suppurativa: Secondary | ICD-10-CM | POA: Diagnosis present

## 2023-05-16 DIAGNOSIS — M069 Rheumatoid arthritis, unspecified: Secondary | ICD-10-CM | POA: Diagnosis present

## 2023-05-16 DIAGNOSIS — I10 Essential (primary) hypertension: Secondary | ICD-10-CM

## 2023-05-16 DIAGNOSIS — B9620 Unspecified Escherichia coli [E. coli] as the cause of diseases classified elsewhere: Secondary | ICD-10-CM | POA: Diagnosis present

## 2023-05-16 DIAGNOSIS — K649 Unspecified hemorrhoids: Secondary | ICD-10-CM | POA: Diagnosis present

## 2023-05-16 DIAGNOSIS — D649 Anemia, unspecified: Secondary | ICD-10-CM

## 2023-05-16 DIAGNOSIS — R1031 Right lower quadrant pain: Secondary | ICD-10-CM | POA: Diagnosis not present

## 2023-05-16 DIAGNOSIS — Z8249 Family history of ischemic heart disease and other diseases of the circulatory system: Secondary | ICD-10-CM | POA: Diagnosis not present

## 2023-05-16 DIAGNOSIS — N281 Cyst of kidney, acquired: Secondary | ICD-10-CM | POA: Diagnosis present

## 2023-05-16 DIAGNOSIS — K5903 Drug induced constipation: Secondary | ICD-10-CM | POA: Diagnosis not present

## 2023-05-16 DIAGNOSIS — K5720 Diverticulitis of large intestine with perforation and abscess without bleeding: Secondary | ICD-10-CM | POA: Diagnosis present

## 2023-05-16 DIAGNOSIS — B961 Klebsiella pneumoniae [K. pneumoniae] as the cause of diseases classified elsewhere: Secondary | ICD-10-CM | POA: Diagnosis present

## 2023-05-16 DIAGNOSIS — E876 Hypokalemia: Secondary | ICD-10-CM | POA: Diagnosis present

## 2023-05-16 DIAGNOSIS — K50914 Crohn's disease, unspecified, with abscess: Secondary | ICD-10-CM | POA: Diagnosis not present

## 2023-05-16 DIAGNOSIS — K50814 Crohn's disease of both small and large intestine with abscess: Secondary | ICD-10-CM | POA: Diagnosis not present

## 2023-05-16 DIAGNOSIS — M461 Sacroiliitis, not elsewhere classified: Secondary | ICD-10-CM | POA: Diagnosis present

## 2023-05-16 DIAGNOSIS — T50995A Adverse effect of other drugs, medicaments and biological substances, initial encounter: Secondary | ICD-10-CM | POA: Diagnosis not present

## 2023-05-16 DIAGNOSIS — R110 Nausea: Secondary | ICD-10-CM

## 2023-05-16 DIAGNOSIS — K651 Peritoneal abscess: Secondary | ICD-10-CM

## 2023-05-16 DIAGNOSIS — K50014 Crohn's disease of small intestine with abscess: Secondary | ICD-10-CM | POA: Diagnosis present

## 2023-05-16 DIAGNOSIS — Z91010 Allergy to peanuts: Secondary | ICD-10-CM | POA: Diagnosis not present

## 2023-05-16 DIAGNOSIS — Z79899 Other long term (current) drug therapy: Secondary | ICD-10-CM | POA: Diagnosis not present

## 2023-05-16 DIAGNOSIS — F17290 Nicotine dependence, other tobacco product, uncomplicated: Secondary | ICD-10-CM | POA: Diagnosis present

## 2023-05-16 DIAGNOSIS — N200 Calculus of kidney: Secondary | ICD-10-CM | POA: Diagnosis present

## 2023-05-16 DIAGNOSIS — Z885 Allergy status to narcotic agent status: Secondary | ICD-10-CM | POA: Diagnosis not present

## 2023-05-16 DIAGNOSIS — Z8719 Personal history of other diseases of the digestive system: Secondary | ICD-10-CM | POA: Diagnosis not present

## 2023-05-16 HISTORY — PX: IR US GUIDE BX ASP/DRAIN: IMG2392

## 2023-05-16 LAB — IRON AND TIBC
Iron: 60 ug/dL (ref 45–182)
Saturation Ratios: 23 % (ref 17.9–39.5)
TIBC: 256 ug/dL (ref 250–450)
UIBC: 196 ug/dL

## 2023-05-16 LAB — CBC
HCT: 44.5 % (ref 39.0–52.0)
Hemoglobin: 14.2 g/dL (ref 13.0–17.0)
MCH: 29.7 pg (ref 26.0–34.0)
MCHC: 31.9 g/dL (ref 30.0–36.0)
MCV: 93.1 fL (ref 80.0–100.0)
Platelets: 475 10*3/uL — ABNORMAL HIGH (ref 150–400)
RBC: 4.78 MIL/uL (ref 4.22–5.81)
RDW: 16.3 % — ABNORMAL HIGH (ref 11.5–15.5)
WBC: 18.8 10*3/uL — ABNORMAL HIGH (ref 4.0–10.5)
nRBC: 0 % (ref 0.0–0.2)

## 2023-05-16 LAB — COMPREHENSIVE METABOLIC PANEL
ALT: 17 U/L (ref 0–44)
AST: 11 U/L — ABNORMAL LOW (ref 15–41)
Albumin: 3.3 g/dL — ABNORMAL LOW (ref 3.5–5.0)
Alkaline Phosphatase: 86 U/L (ref 38–126)
Anion gap: 7 (ref 5–15)
BUN: 14 mg/dL (ref 6–20)
CO2: 28 mmol/L (ref 22–32)
Calcium: 9 mg/dL (ref 8.9–10.3)
Chloride: 102 mmol/L (ref 98–111)
Creatinine, Ser: 0.86 mg/dL (ref 0.61–1.24)
GFR, Estimated: >60 mL/min (ref >60)
Glucose, Bld: 127 mg/dL — ABNORMAL HIGH (ref 70–99)
Potassium: 3.9 mmol/L (ref 3.5–5.1)
Sodium: 137 mmol/L (ref 135–145)
Total Bilirubin: 0.5 mg/dL (ref 0.0–1.2)
Total Protein: 7.3 g/dL (ref 6.5–8.1)

## 2023-05-16 LAB — FERRITIN: Ferritin: 107 ng/mL (ref 24–336)

## 2023-05-16 LAB — PROTIME-INR
INR: 1 (ref 0.8–1.2)
Prothrombin Time: 13.6 s (ref 11.4–15.2)

## 2023-05-16 LAB — VITAMIN B12: Vitamin B-12: 274 pg/mL (ref 180–914)

## 2023-05-16 LAB — HIV ANTIBODY (ROUTINE TESTING W REFLEX): HIV Screen 4th Generation wRfx: NONREACTIVE

## 2023-05-16 MED ORDER — METHYLPREDNISOLONE SODIUM SUCC 40 MG IJ SOLR
30.0000 mg | Freq: Two times a day (BID) | INTRAMUSCULAR | Status: DC
  Administered 2023-05-16 – 2023-05-20 (×8): 30 mg via INTRAVENOUS
  Filled 2023-05-16 (×8): qty 1

## 2023-05-16 MED ORDER — SODIUM CHLORIDE 0.9% FLUSH
5.0000 mL | Freq: Three times a day (TID) | INTRAVENOUS | Status: DC
  Administered 2023-05-17 – 2023-05-20 (×11): 5 mL

## 2023-05-16 MED ORDER — FENTANYL CITRATE (PF) 100 MCG/2ML IJ SOLN
INTRAMUSCULAR | Status: AC
Start: 2023-05-16 — End: ?
  Filled 2023-05-16: qty 2

## 2023-05-16 MED ORDER — HYDRALAZINE HCL 25 MG PO TABS
25.0000 mg | ORAL_TABLET | Freq: Four times a day (QID) | ORAL | Status: DC | PRN
  Administered 2023-05-17: 25 mg via ORAL
  Filled 2023-05-16: qty 1

## 2023-05-16 MED ORDER — LIDOCAINE-EPINEPHRINE 1 %-1:100000 IJ SOLN
INTRAMUSCULAR | Status: AC
  Filled 2023-05-16: qty 1

## 2023-05-16 MED ORDER — AMLODIPINE BESYLATE 5 MG PO TABS
5.0000 mg | ORAL_TABLET | Freq: Every day | ORAL | Status: DC
  Administered 2023-05-16 – 2023-05-20 (×5): 5 mg via ORAL
  Filled 2023-05-16 (×5): qty 1

## 2023-05-16 MED ORDER — LIDOCAINE HCL 1 % IJ SOLN
20.0000 mL | Freq: Once | INTRAMUSCULAR | Status: AC
  Administered 2023-05-16: 10 mL via INTRADERMAL
  Filled 2023-05-16: qty 20

## 2023-05-16 MED ORDER — FENTANYL CITRATE (PF) 100 MCG/2ML IJ SOLN
INTRAMUSCULAR | Status: AC | PRN
  Administered 2023-05-16 (×2): 50 ug via INTRAVENOUS

## 2023-05-16 MED ORDER — MIDAZOLAM HCL 2 MG/2ML IJ SOLN
INTRAMUSCULAR | Status: AC
  Filled 2023-05-16: qty 4

## 2023-05-16 MED ORDER — MIDAZOLAM HCL 2 MG/2ML IJ SOLN
INTRAMUSCULAR | Status: AC | PRN
  Administered 2023-05-16 (×2): 1 mg via INTRAVENOUS

## 2023-05-16 NOTE — Progress Notes (Signed)
[facility]  PROGRESS NOTE  Mark [name] FAO:[id number] DOB: 01/11/1971 DOA: 05/15/2023 PCP: de Peru, Raymond J, MD   Brief HPI:    53 y.o. male with medical history significant of chron's disease, essential hypertension, rheumatoid arthritis, who is being managed by GI in the outpatient setting with previous surgery involving perirectal abscess.  Patient was seen and evaluated.  He has been having significant abdominal pain with nausea.  He was recently seen by GI and given prednisone taper which he has almost completed.  After completion he started having symptoms again.  Went to med [employer] where he was seen and evaluated.  At this point patient appears to have flareup of the Crohn's disease.  Patient being admitted for further evaluation and treatment.  CT confirmed terminal ileitis anterior abscess and progression of IBD.     Assessment/Plan:     Acute flare of Crohn disease -CT abdomen/pelvis showed walled off abscess in the right lower quadrant -Gastroenterology consulted -Recommended IR drainage -Plan for IR drainage with drain placement today  Hypokalemia -Replete  Hypertension -Restart home medications including amlodipine 5 mg daily -Start hydralazine as needed  Nephrolithiasis -2 mm nonobstructing stone in the left kidney upper pole -Follow-up with urology as outpatient  Right funiculitis -Likely in setting of abscess as above -Started on antibiotics, drain placement -Will need repeat imaging at some point  History of rheumatoid arthritis -Stable    Medications     amLODipine  5 mg Oral Daily   enoxaparin (LOVENOX) injection  40 mg Subcutaneous QHS   methylPREDNISolone (SOLU-MEDROL) injection  30 mg Intravenous BID     Data Reviewed:   CBG:  No results for input(s): "GLUCAP" in the last 168 hours.  SpO2: 96 % O2 Flow Rate (L/min): 2 L/min    Vitals:   05/16/23 1521 05/16/23 1530 05/16/23 1535 05/16/23 1609  BP: (!) 179/90 (!)  147/83 (!) 150/90 (!) 149/83  Pulse: 63 60 65 66  Resp: 20 20 14 16   Temp:    98.2 F (36.8 C)  TempSrc:    Oral  SpO2: 98% 97% 98% 96%  Weight:      Height:          Data Reviewed:  Basic Metabolic Panel: Recent Labs  Lab 05/15/23 0626 05/16/23 0724  NA 139 137  K 3.4* 3.9  CL 103 102  CO2 28 28  GLUCOSE 99 127*  BUN 19 14  CREATININE 0.99 0.86  CALCIUM 9.2 9.0  MG 1.9  --   PHOS 2.9  --     CBC: Recent Labs  Lab 05/15/23 0626 05/16/23 0724  WBC 16.4* 18.8*  NEUTROABS 11.0*  --   HGB 13.3 14.2  HCT 40.4 44.5  MCV 90.4 93.1  PLT 465* 475*    LFT Recent Labs  Lab 05/15/23 0626 05/16/23 0724  AST 11* 11*  ALT 17 17  ALKPHOS 90 86  BILITOT 0.4 0.5  PROT 7.0 7.3  ALBUMIN 3.6 3.3*     Antibiotics: Anti-infectives (From admission, onward)    Start     Dose/Rate Route Frequency Ordered Stop   05/15/23 1600  piperacillin-tazobactam (ZOSYN) IVPB 3.375 g        3.375 g 12.5 mL/hr over 240 Minutes Intravenous Every 8 hours 05/15/23 0847     05/15/23 0900  piperacillin-tazobactam (ZOSYN) IVPB 3.375 g        3.375 g 100 mL/hr over 30 Minutes Intravenous  Once 05/15/23 0846 05/15/23 1200  DVT prophylaxis: Lovenox  Code Status: Full code  Family Communication: Discussed with patient's mother at bedside   CONSULTS gastroenterology   Subjective   Denies abdominal pain   Objective    Physical Examination:   General-appears in no acute distress Heart-S1-S2, regular, no murmur auscultated Lungs-clear to auscultation bilaterally, no wheezing or crackles auscultated Abdomen-soft, nontender, no organomegaly Extremities-no edema in the lower extremities Neuro-alert, oriented x3, no focal deficit noted   Status is: Inpatient:             Meredeth Ide   [facility] If 7PM-7AM, please contact night-coverage at [email or web address], Office  [phone]   05/16/2023, 5:45 PM  LOS: 0 days

## 2023-05-16 NOTE — Consult Note (Signed)
Chief Complaint: Abdominal pain/abscess; referred for image guided drainage of abdominal abscess  Referring Provider(s): Collier,A,PA-C  Supervising Physician: Gilmer Mor  Patient Status: [facility] - In-pt  History of Present Illness: Mark [name] is a 53 y.o. male with past medical history significant for hypertension, rheumatoid arthritis (on enbrel), hydradenitis suppurativa, Crohn's disease with history of perirectal abscess status post I&D 07/2018. He had recent hospitalization 12/27 through 12/30 with abdominal pain, nausea, vomiting and diagnosed with terminal ileitis, SBO and suspected newly diagnosed Crohn's disease .  He was treated with IV Zosyn and IV Solu-Medrol and subsequently discharged home on Augmentin and prednisone taper.  He presents now with worsening abdominal pain, nausea and CT findings of:  1. Interval progression of patient's known inflammatory bowel disease involving the terminal ileum with a walled-off abscess in the right lower quadrant, anteriorly. 2. Multiple other observations (such as bilateral renal cysts, 2 mm nonobstructing calculus in the left kidney upper pole, bilateral symmetric sacroiliitis, secondary right-sided funiculitis etc.  He is currently afebrile, WBC 18.8, hemoglobin normal, platelets 475k, PT/INR normal, creat nl; he is currently on IV Zosyn.  Surgical consult pending.  Request now received from GI team for image guided right abdominal abscess drain placement.     Patient is Full Code  Past Medical History:  Diagnosis Date   Arthritis    RA   Crohn disease (HCC)    Hip fracture (HCC)    Hypertension    Rheumatoid arthritis (HCC)     Past Surgical History:  Procedure Laterality Date   HYDRADENITIS EXCISION Bilateral 09/18/2018   Procedure: EXCISION BILATERAL AXILLARY HIDRADENITIS;  Surgeon: Harriette Bouillon, MD;  Location: [facility];  Service: General;  Laterality: Bilateral;   INCISION AND DRAINAGE  PERIRECTAL ABSCESS N/A 07/17/2018   Procedure: IRRIGATION AND DEBRIDEMENT PERIRECTAL ABSCESS;  Surgeon: Axel Filler, MD;  Location: [facility];  Service: General;  Laterality: N/A;    Allergies: Oxycodone and Peanut-containing drug products  Medications: Prior to Admission medications   Medication Sig Start Date End Date Taking? Authorizing Provider  acetaminophen (TYLENOL) 500 MG tablet Take 500 mg by mouth every 6 (six) hours as needed.   Yes [provider]  amLODipine (NORVASC) 5 MG tablet Take 1 tablet (5 mg total) by mouth daily. 04/02/23  Yes Noralee Stain, DO  dicyclomine (BENTYL) 20 MG tablet Take 1 tablet (20 mg total) by mouth 2 (two) times daily as needed for spasms (abdominal cramping). 05/09/23  Yes Cirigliano, Vito V, DO  ENBREL SURECLICK 50 MG/ML injection Inject 50 mg into the skin every Monday. 06/15/18  Yes [provider]  predniSONE (DELTASONE) 10 MG tablet Take 4 tablets (40 mg total) by mouth daily with breakfast for 7 days, THEN 3 tablets (30 mg total) daily with breakfast for 7 days, THEN 2 tablets (20 mg total) daily with breakfast for 7 days, THEN 1 tablet (10 mg total) daily with breakfast for 7 days. While waiting Remicade approval. 05/10/23 06/07/23 Yes Cirigliano, Vito V, DO  docusate sodium (COLACE) 100 MG capsule Take 1 capsule (100 mg total) by mouth every 12 (twelve) hours. Patient not taking: Reported on 05/03/2023 03/23/23   Mesner, Barbara Cower, MD  Na Sulfate-K Sulfate-Mg Sulf (SUPREP BOWEL PREP KIT) 17.5-3.13-1.6 GM/177ML SOLN Take 1 kit by mouth as directed. Patient not taking: Reported on 05/03/2023 04/11/23   Shellia Cleverly, DO     Family History  Problem Relation Age of Onset   Heart failure Mother  Aneurysm Father    Colon polyps Neg Hx    Colon cancer Neg Hx    Esophageal cancer Neg Hx    Stomach cancer Neg Hx    Rectal cancer Neg Hx     Social History   Socioeconomic History   Marital status: Divorced    Spouse name: Not on file    Number of children: Not on file   Years of education: Not on file   Highest education level: Not on file  Occupational History   Not on file  Tobacco Use   Smoking status: Some Days    Types: Cigars   Smokeless tobacco: Never  Vaping Use   Vaping status: Never Used  Substance and Sexual Activity   Alcohol use: Not Currently   Drug use: Not Currently    Frequency: 3.0 times per week   Sexual activity: Not on file  Other Topics Concern   Not on file  Social History Narrative   Not on file   Social Drivers of Health   Financial Resource Strain: Not on file  Food Insecurity: No Food Insecurity (05/15/2023)   Hunger Vital Sign    Worried About Running Out of Food in the Last Year: Never true    Ran Out of Food in the Last Year: Never true  Transportation Needs: No Transportation Needs (05/15/2023)   PRAPARE - Administrator, Civil Service (Medical): No    Lack of Transportation (Non-Medical): No  Physical Activity: Not on file  Stress: Not on file  Social Connections: Unknown (08/15/2021)   [facility] from [facility]   Social Network    Social Network: Not on file       Review of Systems denies fever,HA,CP, dyspnea, cough, back pain, vomiting or bleeding; does have lower abd discomfort, occ nausea  Vital Signs: BP (!) 185/96 (BP Location: Left Arm)   Pulse 63   Temp 98 F (36.7 C) (Oral)   Resp 20   Ht 5\' 10"  (1.778 m)   Wt 160 lb (72.6 kg)   SpO2 95%   BMI 22.96 kg/m   Advance Care Plan: no documents on file  Physical Exam: awake/alert; chest- CTA bilat; heart- RRR; abd-soft,+BS, mildly tender RLQ to palpation; no LE edema  Imaging: CT ABDOMEN PELVIS W CONTRAST Result Date: 05/15/2023 CLINICAL DATA:  RLQ abdominal pain. EXAM: CT ABDOMEN AND PELVIS WITH CONTRAST TECHNIQUE: Multidetector CT imaging of the abdomen and pelvis was performed using the standard protocol following bolus administration of intravenous contrast. RADIATION  DOSE REDUCTION: This exam was performed according to the departmental dose-optimization program which includes automated exposure control, adjustment of the mA and/or kV according to patient size and/or use of iterative reconstruction technique. CONTRAST:  85mL OMNIPAQUE IOHEXOL 300 MG/ML  SOLN COMPARISON:  CT scan abdomen and pelvis from 03/30/2023. FINDINGS: Lower chest: The lung bases are clear. No pleural effusion. The heart is normal in size. No pericardial effusion. Hepatobiliary: The liver is normal in size. Non-cirrhotic configuration. No suspicious mass. No intrahepatic or extrahepatic bile duct dilation. No calcified gallstones. Normal gallbladder wall thickness. No pericholecystic inflammatory changes. Pancreas: Unremarkable. No pancreatic ductal dilatation or surrounding inflammatory changes. Spleen: Within normal limits. No focal lesion. Adrenals/Urinary Tract: Adrenal glands are unremarkable. No suspicious renal mass. There is a 5.1 x 5.2 cm partially exophytic simple cyst arising from the left kidney lower pole. There are several subcentimeter sized hypoattenuating foci in the right kidney, which are too small to adequately characterize  but favored to represent cysts as well. No hydronephrosis. There is a 2 mm nonobstructing calculus in the left kidney upper pole. No other renal or ureteric calculi. Unremarkable urinary bladder. Stomach/Bowel: No disproportionate dilation of the small or large bowel loops. Appendix is visualized and appears unremarkable. There is irregular/asymmetric circumferential thickening of approximately 9 cm length of terminal ileum, which appears grossly similar to the prior study. 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 There are few intervening incomplete  septations. Findings favor right lower quadrant abscess secondary to inflammatory bowel disease. Vascular/Lymphatic: No ascites or pneumoperitoneum. No abdominal or pelvic lymphadenopathy, by size criteria. No aneurysmal dilation of the major abdominal arteries. There are mild-to-moderate atherosclerotic vascular calcifications of the aorta and its major branches. Reproductive: Normal size prostate. Symmetric seminal vesicles. Other: There is mild asymmetric hyperemia and fat stranding in the right inguinal region/spermatic cord, most likely secondary to above-mentioned abscess. The soft tissues and abdominal wall are otherwise unremarkable. Musculoskeletal: No suspicious osseous lesions. There are mild multilevel degenerative changes in the visualized spine. Redemonstration of changes of bilateral sacroiliitis. IMPRESSION: 1. Interval progression of patient's known inflammatory bowel disease involving the terminal ileum with a walled-off abscess in the right lower quadrant, anteriorly. 2. Multiple other observations (such as bilateral renal cysts, 2 mm nonobstructing calculus in the left kidney upper pole, bilateral symmetric sacroiliitis, secondary right-sided funiculitis etc.), as described above. Aortic Atherosclerosis (ICD10-I70.0). Electronically Signed   By: Jules Schick M.D.   On: 05/15/2023 08:24    Labs:  CBC: Recent Labs    03/30/23 1125 03/31/23 0523 05/15/23 0626 05/16/23 0724  WBC 10.2 7.0 16.4* 18.8*  HGB 13.5 11.5* 13.3 14.2  HCT 40.9 35.7* 40.4 44.5  PLT 428* 385 465* 475*    COAGS: Recent Labs    05/16/23 1024  INR 1.0    BMP: Recent Labs    03/23/23 0356 03/30/23 1125 03/31/23 0523 05/15/23 0626 05/16/23 0724  NA 138 138  --  139 137  K 3.7 3.6 3.5 3.4* 3.9  CL 103 102  --  103 102  CO2 24 26  --  28 28  GLUCOSE 91 85  --  99 127*  BUN 16 11  --  19 14  CALCIUM 9.5 9.3  --  9.2 9.0  CREATININE 0.93 1.03 0.93 0.99 0.86  GFRNONAA >60 >60 >60 >60 >60     LIVER FUNCTION TESTS: Recent Labs    03/23/23 0356 03/30/23 1125 05/15/23 0626 05/16/23 0724  BILITOT 0.5 0.5 0.4 0.5  AST 15 15 11* 11*  ALT 9 9 17 17   ALKPHOS 120 109 90 86  PROT 8.0 8.4* 7.0 7.3  ALBUMIN 4.1 4.0 3.6 3.3*    TUMOR MARKERS: No results for input(s): "AFPTM", "CEA", "CA199", "CHROMGRNA" in the last 8760 hours.  Assessment and Plan: 53 y.o. male with past medical history significant for hypertension, rheumatoid arthritis (on enbrel), hydradenitis suppurativa, Crohn's disease with history of perirectal abscess status post I&D 07/2018. He had recent hospitalization 12/27 through 12/30 with abdominal pain, nausea, vomiting and diagnosed with terminal ileitis, SBO and suspected newly diagnosed Crohn's disease .  He was treated with IV Zosyn and IV Solu-Medrol and subsequently discharged home on Augmentin and prednisone taper.  He presents now with worsening abdominal pain, nausea and CT findings of:  1.  Interval progression of patient's known inflammatory bowel disease involving the terminal ileum with a walled-off abscess in the right lower quadrant, anteriorly. 2. Multiple other observations (such as bilateral renal cysts, 2 mm nonobstructing calculus in the left kidney upper pole, bilateral symmetric sacroiliitis, secondary right-sided funiculitis etc.  He is currently afebrile, WBC 18.8, hemoglobin normal, platelets 475k, PT/INR normal, creat nl; he is currently on IV Zosyn.  Surgical consult pending.  Request now received from GI team for image guided right abdominal abscess drain placement.  Imaging studies have been reviewed by Dr. Loreta Ave.Risks and benefits discussed with the patient including bleeding, infection, damage to adjacent structures, bowel perforation/fistula connection, and sepsis.  All of the patient's questions were answered, patient is agreeable to proceed. Consent signed and in chart.  Procedure tentatively scheduled for later today or  tomorrow   Thank you for allowing our service to participate in Mark [name] 's care.  Electronically Signed: D. Jeananne Rama, PA-C   05/16/2023, 11:52 AM      I spent a total of 40 Minutes    in face to face in clinical consultation, greater than 50% of which was counseling/coordinating care for image guided drainage of right lower abdominal abscess

## 2023-05-16 NOTE — Procedures (Addendum)
Interventional Radiology Procedure Note  Procedure: Image guided drain placement, RLQ/pelvis.  21F pigtail drain.  Complications: None  EBL: None Sample: Culture sent  Recommendations: - Routine drain care, with sterile flushes, record output - follow up Cx - routine wound care - ok to advance diet per primary order   Signed,  Yvone Neu. Loreta Ave, DO

## 2023-05-16 NOTE — Plan of Care (Signed)

## 2023-05-16 NOTE — Progress Notes (Signed)
Patient returned to unit. He is alert, oriented x4. Dressing RLQ pelvis clean dry intact, JP drain charge, unclamped.

## 2023-05-16 NOTE — Consult Note (Addendum)
Consultation  Referring Provider:   [facility] Primary Care Physician:  de [place], Buren Kos, MD Primary Gastroenterologist:  Dr. Barron Alvine       Reason for Consultation: Lower abdominal pain with abscess, Crohn's disease DOA: 05/15/2023         Hospital Day: 2         HPI:   Mark [name] is a 53 y.o. male with past medical history significant for hypertension, rheumatoid arthritis (on enbrel), hydradenitis suppurativa, Crohn's disease with history of perirectal abscess status post I&D 07/2018.  He had recent hospitalization 12/27 through 12/30 with abdominal pain, nausea vomiting diagnosed with terminal ileitis, SBO and suspected newly diagnosed Crohn's disease.  03/30/2023 CT abdomen pelvis showed progressive soft tissue density right lateral aspect terminal ileum concerning for penetrating disease and fistula no abscess at that time.   Patient treated with IV Zosyn and IV Solu-Medrol discharged on Augmentin and prednisone taper.   Negative infectious studies, ESR 15, CRP 0.7.  Patient had normal TPMT activity negative TB Gold, negative hepatitis B.   Patient previously treated with Humira and Rinvoq for RA history of follows with Dr. Azucena Fallen at [facility]ology. Patient was discharged on oral prednisone with improving symptoms. 04/10/2022 in office visit with Dr. Barron Alvine for potentially starting Remicade or Stelara but was going to in concert with rheumatologist versus Imuran added to Enbrel.  Patient missed initial EGD/colonoscopy Was rescheduled for 06/12/2023.  Patient presented 2/11 to med [facility] for worsening abdominal pain and nausea. Work up notable for  Hypokalemia potassium 3.4, BUN 19, creatinine 0.99, unremarkable LFTs WBC 16.4, Hgb 13.3, platelets 465  (compared to 03/31/2023 WBC 7, Hgb 11.5, platelets 385) CT abdomen pelvis with contrast showed  interval progression of patient's known inflammatory bowel disease involving terminal ileum with a  walled off abscess and right lower quadrant anteriorly measuring 2.6 x 4.5 x 5.9 centimeters few intervening incomplete septations, irregular/asymmetric circumferential thickening of approximately 9 cm length of terminal ileum similar to previous study.  Unchanged bilateral sacroiliitis. 05/11/2023 CRP 2.9, sed rate 2/7 elevated at 86 Pending fecal calprotectin  No family was present at the time of my evaluation. After finishing prednisone taper February 3, 3 days afterwards began to have worsening right lower quadrant abdominal discomfort waking him up from his sleep.  Restarted prednisone taper 40 mg daily 2/6 in the evening but pain continued.  He had 1 episode of nausea and vomiting Wednesday evening 2/5.  Patient denies any other nausea, abdominal distention.  Denies fever or chills.  Patient is passing gas and having bowel movements 1 to 2-day brown formed last bowel movement was yesterday denies any mucus or rectal pain.  Patient has had some fatigue and exertional shortness of breath with stairs but no chest pain. Patient states dicyclomine did not help discomfort Tylenol helps some. Last dose of Enbrel was February 3 was supposed to have another dose Monday but he missed this.  Patient states overall his joint pain is well-controlled, denies rashes, change in vision or oral ulcers.  Denies any rectal pain or drainage has had some minor right inguinal/testicular discomfort with an area of drainage that has improved since being on antibiotics.  Patient saw a rheumatologist 04/26/2023 and is currently waiting for Remicade approval.  Is currently in the appeal process per patient.  GI History: 53 year old male with a history of rheumatoid arthritis (on Enbrel), HTN, hidradenitis suppurativa, perirectal abscess  s/p I&D 07/2018, recently hospitalized 12/27-30  with abdominal pain, nausea/vomiting and diagnosed with terminal ileitis, SBO, and suspected newly diagnosed Crohn's Disease. - 03/23/2023: ER  evaluation for abdominal pain, nausea/vomiting. - 03/23/2023: CT A/P: Skip areas of bowel wall thickening including in the duodenum, proximal jejunum, terminal ileum, with most pronounced inflammation at the terminal ileum with regional peritoneal thickening considered reactive peritonitis.  Treated with Cipro/Flagyl x 7 days, dicyclomine - 03/30/2023: Represented to the ER with ongoing abdominal pain and nausea/vomiting along with diarrhea.  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  No abscess.  New focal wall thickening in the distal ileum with dilated small bowel loops with fecalization concerning for partial SBO.  Chronic sacroiliitis. - Negative C. difficile, GI PCR panel - ESR 59, CRP 0.7 - Normal/negative hepatitis B (needs HBV vaccine series).  Negative QuantiFERON gold.  Normal TPMT activity - General Surgery and GI consulted and was treated medically with IV Zosyn, IV Solu-Medrol with clinical improvement.  Was transition to Augmentin and prednisone with taper   For his history of RA, has been previously treated with Humira, then Rinvoq (not efficacious), then switched to Enbrel about 1.5 years ago.  Follows with Dr. Azucena Fallen at [facility].   Endoscopic History: - 01/10/2023: Colonoscopy (indication: Routine CRC screening; was otherwise asymptomatic): Normal, small terminal hemorrhoids  Abnormal ED labs: Abnormal Labs Reviewed  COMPREHENSIVE METABOLIC PANEL - Abnormal; Notable for the following components:      Result Value   Potassium 3.4 (*)    AST 11 (*)    All other components within normal limits  CBC WITH DIFFERENTIAL/PLATELET - Abnormal; Notable for the following components:   WBC 16.4 (*)    RDW 16.3 (*)    Platelets 465 (*)    Neutro Abs 11.0  (*)    Monocytes Absolute 1.4 (*)    All other components within normal limits    Past Medical History:  Diagnosis Date   Arthritis    RA   Crohn disease (HCC)    Hip fracture (HCC)    Hypertension    Rheumatoid arthritis (HCC)     Surgical History:  He  has a past surgical history that includes Incision and drainage perirectal abscess (N/A, 07/17/2018) and Hydradenitis excision (Bilateral, 09/18/2018). Family History:  His family history includes Aneurysm in his father; Heart failure in his mother. Social History:   reports that he has been smoking cigars. He has never used smokeless tobacco. He reports that he does not currently use alcohol. He reports that he does not currently use drugs. Frequency: 3.00 times per week.  Prior to Admission medications   Medication Sig Start Date End Date Taking? Authorizing Provider  acetaminophen (TYLENOL) 500 MG tablet Take 500 mg by mouth every 6 (six) hours as needed.   Yes [provider]  amLODipine (NORVASC) 5 MG tablet Take 1 tablet (5 mg total) by mouth daily. 04/02/23  Yes Noralee Stain, DO  dicyclomine (BENTYL) 20 MG tablet Take 1 tablet (20 mg total) by mouth 2 (two) times daily as needed for spasms (abdominal cramping). 05/09/23  Yes Cirigliano, Vito V, DO  ENBREL SURECLICK 50 MG/ML injection Inject 50 mg into the skin every Monday. 06/15/18  Yes [provider]  predniSONE (DELTASONE) 10 MG tablet Take 4 tablets (40 mg total) by mouth daily with breakfast for 7 days, THEN 3 tablets (  30 mg total) daily with breakfast for 7 days, THEN 2 tablets (20 mg total) daily with breakfast for 7 days, THEN 1 tablet (10 mg total) daily with breakfast for 7 days. While waiting Remicade approval. 05/10/23 06/07/23 Yes Cirigliano, Vito V, DO  docusate sodium (COLACE) 100 MG capsule Take 1 capsule (100 mg total) by mouth every 12 (twelve) hours. Patient not taking: Reported on 05/03/2023 03/23/23   Mesner, Barbara Cower, MD  Na Sulfate-K Sulfate-Mg  Sulf (SUPREP BOWEL PREP KIT) 17.5-3.13-1.6 GM/177ML SOLN Take 1 kit by mouth as directed. Patient not taking: Reported on 05/03/2023 04/11/23   Shellia Cleverly, DO    Current Facility-Administered Medications  Medication Dose Route Frequency Provider Last Rate Last Admin   dextrose 5% in lactated ringers with KCl 20 mEq/L infusion   Intravenous Continuous Rometta Emery, MD 100 mL/hr at 05/16/23 0440 Infusion Verify at 05/16/23 0440   enoxaparin (LOVENOX) injection 40 mg  40 mg Subcutaneous QHS Earlie Lou L, MD   40 mg at 05/15/23 2349   methylPREDNISolone sodium succinate (SOLU-MEDROL) 40 mg/mL injection 40 mg  40 mg Intravenous BID Rometta Emery, MD   40 mg at 05/16/23 0030   morphine (PF) 2 MG/ML injection 2 mg  2 mg Intravenous Q4H PRN Tanda Rockers A, DO   2 mg at 05/16/23 0456   ondansetron (ZOFRAN) injection 4 mg  4 mg Intravenous Q6H PRN Tanda Rockers A, DO   4 mg at 05/15/23 1610   piperacillin-tazobactam (ZOSYN) IVPB 3.375 g  3.375 g Intravenous Q8H Tanda Rockers A, DO   Stopped at 05/16/23 0354    Allergies as of 05/15/2023 - Review Complete 05/15/2023  Allergen Reaction Noted   Oxycodone Other (See Comments) 05/15/2023   Peanut-containing drug products Itching and Rash 07/17/2018    Review of Systems:    Constitutional: No weight loss, fever, chills, weakness or fatigue HEENT: Eyes: No change in vision               Ears, Nose, Throat:  No change in hearing or congestion Skin: No rash or itching Cardiovascular: No chest pain, chest pressure or palpitations   Respiratory: No SOB or cough Gastrointestinal: See HPI and otherwise negative Genitourinary: No dysuria or change in urinary frequency Neurological: No headache, dizziness or syncope Musculoskeletal: No new muscle or joint pain Hematologic: No bleeding or bruising Psychiatric: No history of depression or anxiety     Physical Exam:  Vital signs in last 24 hours: Temp:  [98 F (36.7 C)-98.5 F (36.9 C)]  98 F (36.7 C) (02/12 0735) Pulse Rate:  [51-73] 63 (02/12 0735) Resp:  [7-21] 20 (02/12 0735) BP: (145-201)/(81-118) 185/96 (02/12 0735) SpO2:  [95 %-100 %] 95 % (02/12 0735) Last BM Date : 05/15/23 Last BM recorded by nurses in past 5 days No data recorded  General:   Pleasant, well developed male in no acute distress Head:  Normocephalic and atraumatic.No oral ulcers.  Eyes: sclerae anicteric,conjunctive pink  Heart:  regular rate and rhythm, no murmurs or gallops Pulm: Clear anteriorly; no wheezing Abdomen:  Soft, Non-distended AB, Active bowel sounds. Moderate tenderness to palpation RLQ with mild rebound tenderness Extremities:  Without edema. Rectal exam/inguinal exam: No perirectal abscess, patient has no visualized fistula right inguinal testicular area but does have mild induration/nontender lymph node versus mass right inguinal. Msk:  Symmetrical without gross deformities. Peripheral pulses intact.  Neurologic:  Alert and  oriented x4;  No focal deficits.  Skin:   Dry  and intact without significant lesions or rashes. Psychiatric:  Cooperative. Normal mood and affect.  LAB RESULTS: Recent Labs    05/15/23 0626  WBC 16.4*  HGB 13.3  HCT 40.4  PLT 465*   BMET Recent Labs    05/15/23 0626  NA 139  K 3.4*  CL 103  CO2 28  GLUCOSE 99  BUN 19  CREATININE 0.99  CALCIUM 9.2   LFT Recent Labs    05/15/23 0626  PROT 7.0  ALBUMIN 3.6  AST 11*  ALT 17  ALKPHOS 90  BILITOT 0.4   PT/INR No results for input(s): "LABPROT", "INR" in the last 72 hours.  STUDIES: CT ABDOMEN PELVIS W CONTRAST Result Date: 05/15/2023 CLINICAL DATA:  RLQ abdominal pain. EXAM: CT ABDOMEN AND PELVIS WITH CONTRAST TECHNIQUE: Multidetector CT imaging of the abdomen and pelvis was performed using the standard protocol following bolus administration of intravenous contrast. RADIATION DOSE REDUCTION: This exam was performed according to the departmental dose-optimization program which  includes automated exposure control, adjustment of the mA and/or kV according to patient size and/or use of iterative reconstruction technique. CONTRAST:  85mL OMNIPAQUE IOHEXOL 300 MG/ML  SOLN COMPARISON:  CT scan abdomen and pelvis from 03/30/2023. FINDINGS: Lower chest: The lung bases are clear. No pleural effusion. The heart is normal in size. No pericardial effusion. Hepatobiliary: The liver is normal in size. Non-cirrhotic configuration. No suspicious mass. No intrahepatic or extrahepatic bile duct dilation. No calcified gallstones. Normal gallbladder wall thickness. No pericholecystic inflammatory changes. Pancreas: Unremarkable. No pancreatic ductal dilatation or surrounding inflammatory changes. Spleen: Within normal limits. No focal lesion. Adrenals/Urinary Tract: Adrenal glands are unremarkable. No suspicious renal mass. There is a 5.1 x 5.2 cm partially exophytic simple cyst arising from the left kidney lower pole. There are several subcentimeter sized hypoattenuating foci in the right kidney, which are too small to adequately characterize but favored to represent cysts as well. No hydronephrosis. There is a 2 mm nonobstructing calculus in the left kidney upper pole. No other renal or ureteric calculi. Unremarkable urinary bladder. Stomach/Bowel: No disproportionate dilation of the small or large bowel loops. Appendix is visualized and appears unremarkable. There is irregular/asymmetric circumferential thickening of approximately 9 cm length of terminal ileum, which appears grossly similar to the prior study. 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 There are few intervening incomplete septations. Findings favor right lower quadrant abscess secondary to inflammatory bowel disease.  Vascular/Lymphatic: No ascites or pneumoperitoneum. No abdominal or pelvic lymphadenopathy, by size criteria. No aneurysmal dilation of the major abdominal arteries. There are mild-to-moderate atherosclerotic vascular calcifications of the aorta and its major branches. Reproductive: Normal size prostate. Symmetric seminal vesicles. Other: There is mild asymmetric hyperemia and fat stranding in the right inguinal region/spermatic cord, most likely secondary to above-mentioned abscess. The soft tissues and abdominal wall are otherwise unremarkable. Musculoskeletal: No suspicious osseous lesions. There are mild multilevel degenerative changes in the visualized spine. Redemonstration of changes of bilateral sacroiliitis. IMPRESSION: 1. Interval progression of patient's known inflammatory bowel disease involving the terminal ileum with a walled-off abscess in the right lower quadrant, anteriorly. 2. Multiple other observations (such as bilateral renal cysts, 2 mm nonobstructing calculus in the left kidney upper pole, bilateral symmetric sacroiliitis, secondary right-sided funiculitis etc.), as described above. Aortic Atherosclerosis (ICD10-I70.0). Electronically Signed   By: Rhea Belton  Ramiro Harvest M.D.   On: 05/15/2023 08:24      Impression/Plan     Crohn's Disease with history of perirectal abscess now with development of intraabdominal abscess with associated AB pain/nausea 03/31/2023 TB GOLD Negative 03/31/2023  HepBsAG NON REACTIVE  05/11/2023 CRP 2.9  05/16/2023 WBC 18.8 HGB 14.2 MCV 93.1  (05/15/2023 WBC 16.4 HGB 13.3 MCV 90.4)  Fecal cal pending  ?-Continue Solumedrol IV currently on 40 mg BID, can consider 30 mg BID -Place on maintenance fluids -continue Zosyn -continue lovenox - IR consultation for an Abscess, discussed with general surgery, if patient has continued small bowel fistula can initiate outpatient referral - full liquids  Patient was previously been on Humira and Rinvoq for his rheumatoid  arthritis, currently on Enbrel with continuing inflammation and abscess formation.  Rheumatologist Dr. Azucena Fallen had initiated Remicade prior authorization, per patient this was in the appeal process.  Will work on getting first Remicade dose in the hospital, will discuss potentially starting on Imuran in conjunction. Will need EGD colonoscopy at some point, currently scheduled outpatient 3/10, would keep appointment for now versus considering inpatient pending clinical course.  Normocytic anemia Hemoglobin elevated by suspect some of this may be dehydration/inflammation Patient's had some exertional shortness of breath, will get iron, ferritin and B12.   Principal Problem:   RLQ abdominal pain Active Problems:   Primary hypertension   Rheumatoid arthritis (HCC)   Exacerbation of Crohn's disease (HCC)   Hypokalemia   Leucocytosis   Nephrolithiasis    LOS: 0 days   Thank you for your kind consultation, we will continue to follow.   Doree Albee  05/16/2023, 7:48 AM

## 2023-05-17 DIAGNOSIS — M069 Rheumatoid arthritis, unspecified: Secondary | ICD-10-CM | POA: Diagnosis not present

## 2023-05-17 DIAGNOSIS — R110 Nausea: Secondary | ICD-10-CM | POA: Diagnosis not present

## 2023-05-17 DIAGNOSIS — R1031 Right lower quadrant pain: Secondary | ICD-10-CM | POA: Diagnosis not present

## 2023-05-17 DIAGNOSIS — D649 Anemia, unspecified: Secondary | ICD-10-CM | POA: Diagnosis not present

## 2023-05-17 DIAGNOSIS — K50814 Crohn's disease of both small and large intestine with abscess: Secondary | ICD-10-CM | POA: Diagnosis not present

## 2023-05-17 DIAGNOSIS — K50914 Crohn's disease, unspecified, with abscess: Secondary | ICD-10-CM | POA: Diagnosis not present

## 2023-05-17 DIAGNOSIS — K651 Peritoneal abscess: Secondary | ICD-10-CM | POA: Diagnosis not present

## 2023-05-17 LAB — CBC
HCT: 41.3 % (ref 39.0–52.0)
Hemoglobin: 13.2 g/dL (ref 13.0–17.0)
MCH: 29.9 pg (ref 26.0–34.0)
MCHC: 32 g/dL (ref 30.0–36.0)
MCV: 93.4 fL (ref 80.0–100.0)
Platelets: 409 10*3/uL — ABNORMAL HIGH (ref 150–400)
RBC: 4.42 MIL/uL (ref 4.22–5.81)
RDW: 16.3 % — ABNORMAL HIGH (ref 11.5–15.5)
WBC: 18.3 10*3/uL — ABNORMAL HIGH (ref 4.0–10.5)
nRBC: 0 % (ref 0.0–0.2)

## 2023-05-17 LAB — COMPREHENSIVE METABOLIC PANEL
ALT: 15 U/L (ref 0–44)
AST: 9 U/L — ABNORMAL LOW (ref 15–41)
Albumin: 3 g/dL — ABNORMAL LOW (ref 3.5–5.0)
Alkaline Phosphatase: 71 U/L (ref 38–126)
Anion gap: 9 (ref 5–15)
BUN: 11 mg/dL (ref 6–20)
CO2: 26 mmol/L (ref 22–32)
Calcium: 9.1 mg/dL (ref 8.9–10.3)
Chloride: 103 mmol/L (ref 98–111)
Creatinine, Ser: 0.85 mg/dL (ref 0.61–1.24)
GFR, Estimated: >60 mL/min (ref >60)
Glucose, Bld: 112 mg/dL — ABNORMAL HIGH (ref 70–99)
Potassium: 4.3 mmol/L (ref 3.5–5.1)
Sodium: 138 mmol/L (ref 135–145)
Total Bilirubin: 0.5 mg/dL (ref 0.0–1.2)
Total Protein: 6.2 g/dL — ABNORMAL LOW (ref 6.5–8.1)

## 2023-05-17 MED ORDER — LOSARTAN POTASSIUM 50 MG PO TABS
25.0000 mg | ORAL_TABLET | Freq: Every day | ORAL | Status: DC
Start: 2023-05-17 — End: 2023-05-20
  Administered 2023-05-17 – 2023-05-20 (×4): 25 mg via ORAL
  Filled 2023-05-17 (×4): qty 1

## 2023-05-17 NOTE — Progress Notes (Addendum)
Progress Note  Primary GI: Dr. Barron Alvine DOA: 05/15/2023         Hospital Day: 3   Subjective  Chief Complaint: Lower abdominal pain with abscess, Crohn's disease   No family was present at the time of my evaluation. Patient's status post IR drain right lower quadrant, culture pending, bulb has been emptied 3 times, I do not see measurements, last time being this morning and patient already has small serosanguineous collection, improved from initial purulent output. Patient states overall his right lower abdominal pain and right testicular pain has improved status post drain, he denies fever, chills.   He is passing gas but has not had a bowel movement, no abdominal distention, denies nausea vomiting is requesting food.    Objective   Vital signs in last 24 hours: Temp:  [98 F (36.7 C)-98.6 F (37 C)] 98.2 F (36.8 C) (02/13 0639) Pulse Rate:  [56-70] 60 (02/13 0958) Resp:  [14-20] 18 (02/13 0639) BP: (147-179)/(79-94) 165/88 (02/13 0958) SpO2:  [96 %-99 %] 99 % (02/13 0639) Last BM Date : 05/15/23 Last BM recorded by nurses in past 5 days No data recorded  General:   Pleasant, well developed male in no acute distress Heart:  regular rate and rhythm, no murmurs or gallops Pulm: Clear anteriorly; no wheezing Abdomen: Soft abdomen, nondistended, active bowel sounds, now with right lower quadrant JP drain with serosanguineous small volume drainage, mild tenderness at the site but no peritoneal signs. Extremities:  Without edema. Msk:  Symmetrical without gross deformities. Peripheral pulses intact.  Neurologic:  Alert and  oriented x4;  No focal deficits.  Skin:   Dry and intact without significant lesions or rashes. Psychiatric:  Cooperative. Normal mood and affect.    Intake/Output from previous day: 02/12 0701 - 02/13 0700 In: 1615.2 [P.O.:240; I.V.:1315; IV Piggyback:60.2] Out: 1150 [Urine:1150] Intake/Output this shift: No intake/output data  recorded.  Studies/Results: IR US ABSCESS DRAIN PLACEMENT Result Date: 05/16/2023 INDICATION: 53 year old male with new diagnosis of Crohn's disease, right lower quadrant abdominal wall abscess referred for drainage. EXAM: IMAGE GUIDED ABSCESS DRAIN RIGHT LOWER QUADRANT MEDICATIONS: None ANESTHESIA/SEDATION: Moderate (conscious) sedation was employed during this procedure. A total of Versed 2.0 mg and Fentanyl 100 mcg was administered intravenously by the radiology nurse. Total intra-service moderate Sedation Time: 10 minutes. The patient's level of consciousness and vital signs were monitored continuously by radiology nursing throughout the procedure under my direct supervision. COMPLICATIONS: None PROCEDURE: Informed written consent was obtained from the patient after a thorough discussion of the procedural risks, benefits and alternatives. All questions were addressed. Maximal Sterile Barrier Technique was utilized including caps, mask, sterile gowns, sterile gloves, sterile drape, hand hygiene and skin antiseptic. A timeout was performed prior to the initiation of the procedure. Patient was positioned supine on the ultrasound stretcher. Ultrasound images were performed with images stored and sent to PACs. The patient was prepped and draped in the usual sterile fashion. 1% lidocaine was used for local anesthesia. Small stab incision was made with 11 blade scalpel. Using ultrasound guidance, trocar technique was used to place a 10 [place] drain into the fluid collection of the anterior abdominal wall. Once the drain was in position approximately 35 cc of frankly purulent material was aspirated. Sample was sent for culture. Approximately 50 cc of sterile saline used for irrigation. The drain was then attached to bulb suction. Drain was sutured in position and attached to bulb suction. Patient tolerated the procedure well and remained hemodynamically stable  throughout. No complications were encountered and no  significant blood loss. IMPRESSION: Status post ultrasound-guided abscess drainage of right lower quadrant abdominal wall abscess. Signed, Yvone Neu. Miachel Roux, RPVI [facility] Electronically Signed   By: Gilmer Mor D.O.   On: 05/16/2023 15:48    Lab Results: Recent Labs    05/15/23 0626 05/16/23 0724 05/17/23 0607  WBC 16.4* 18.8* 18.3*  HGB 13.3 14.2 13.2  HCT 40.4 44.5 41.3  PLT 465* 475* 409*   BMET Recent Labs    05/15/23 0626 05/16/23 0724 05/17/23 0607  NA 139 137 138  K 3.4* 3.9 4.3  CL 103 102 103  CO2 28 28 26   GLUCOSE 99 127* 112*  BUN 19 14 11   CREATININE 0.99 0.86 0.85  CALCIUM 9.2 9.0 9.1   LFT Recent Labs    05/17/23 0607  PROT 6.2*  ALBUMIN 3.0*  AST 9*  ALT 15  ALKPHOS 71  BILITOT 0.5   PT/INR Recent Labs    05/16/23 1024  LABPROT 13.6  INR 1.0     Scheduled Meds:  amLODipine  5 mg Oral Daily   enoxaparin (LOVENOX) injection  40 mg Subcutaneous QHS   methylPREDNISolone (SOLU-MEDROL) injection  30 mg Intravenous BID   sodium chloride flush  5 mL Intracatheter Q8H   Continuous Infusions:  piperacillin-tazobactam (ZOSYN)  IV 3.375 g (05/17/23 0813)      Patient profile:   53 year old male with history of RA on Enbrel, hidradenitis, Crohn's disease, recently diagnosed in December treated with antibiotics and taper but had worsening abdominal discomfort once stopping steroids/antibiotics with developed 60 [place] walled off abscess right lower quadrant with 9 cm segment of ileal inflammation.   Impression/Plan:   Crohn's Disease with history of perirectal abscess now with development of intraabdominal abscess with associated AB pain/nausea 03/31/2023 TB GOLD Negative 03/31/2023  HepBsAG NON REACTIVE  05/11/2023 CRP 2.9  WBC 18.3 HGB 13.2 Platelets 409 (05/16/2023 WBC 18.8 HGB 14.2 MCV 93.1 ) (05/15/2023 WBC 16.4 HGB 13.3 MCV 90.4)  Status post IR drain 2/12, pending  culture ?-Continue Solumedrol IV 30 mg BID -Place on maintenance fluids -continue Zosyn, narrow after culture -continue lovenox -May need outpatient general surgery consult but currently no need for surgical intervention -No evidence of obstruction will advance to diet to soft diet if patient has any nausea, vomiting, distention will decrease back to fluids   Patient was previously been on Humira and Rinvoq for his rheumatoid arthritis, currently on Enbrel with continuing inflammation and abscess formation.  Rheumatologist Dr. Azucena Fallen had initiated Remicade prior authorization, per patient this was in the appeal process.   I discussed with inpatient pharmacy, states that Remicade is no longer indicated for Crohn's disease in patient's and this would not be covered. Sent a message to my nurse to try to start the prior authorization for this patient so when he is able to be discharged we can have preauthorization and a set up at the infusion clinic for first dose of Remicade in the last Dr. Adela Lank feel strongly patient would benefit during this hospitalization. Will need EGD colonoscopy at some point, currently scheduled outpatient 3/10   Normocytic anemia 05/17/2023  HGB 13.2 MCV 93.4 Platelets 409 05/16/2023 Iron 60 Ferritin 107 B12 274 Start on B12 Recent Labs    06/20/22 1237 11/16/22 0924 03/23/23 0356 03/30/23 1125 03/31/23 0523 05/15/23 0626 05/16/23 0724 05/17/23 0607  HGB 12.5* 13.6 12.7* 13.5 11.5* 13.3 14.2 13.2  Principal Problem:   RLQ abdominal pain Active Problems:   Primary hypertension   Rheumatoid arthritis (HCC)   Crohn's disease with abscess (HCC)   Hypokalemia   Leucocytosis   Nephrolithiasis   Intra-abdominal abscess (HCC)   Colonic diverticular abscess    LOS: 1 day   Doree Albee  05/17/2023, 11:22 AM

## 2023-05-17 NOTE — Plan of Care (Signed)

## 2023-05-17 NOTE — Progress Notes (Signed)
[facility]  PROGRESS NOTE  Mark [name] WJX:[id number] DOB: January 22, 1971 DOA: 05/15/2023 PCP: de [place], Raymond J, MD   Brief HPI:    53 y.o. male with medical history significant of chron's disease, essential hypertension, rheumatoid arthritis, who is being managed by GI in the outpatient setting with previous surgery involving perirectal abscess.  Patient was seen and evaluated.  He has been having significant abdominal pain with nausea.  He was recently seen by GI and given prednisone taper which he has almost completed.  After completion he started having symptoms again.  Went to med [facility] where he was seen and evaluated.  At this point patient appears to have flareup of the Crohn's disease.  Patient being admitted for further evaluation and treatment.  CT confirmed terminal ileitis anterior abscess and progression of IBD.     Assessment/Plan:     Acute flare of Crohn disease/walled off abscess in right lower quadrant -CT abdomen/pelvis showed walled off abscess in the right lower quadrant -Gastroenterology consulted; currently on Solu-Medrol 30 mg IV every 12 hours -Recommended IR drainage -IR was consulted, underwent drainage of the abscess with drain placement yesterday -Continue Zosyn  Hypokalemia -Replete  Hypertension -Restart home medications including amlodipine 5 mg daily -Will add losartan 25 mg p.o. daily -Start hydralazine as needed  Nephrolithiasis -2 mm nonobstructing stone in the left kidney upper pole -Follow-up with urology as outpatient  Right funiculitis -Likely in setting of abscess as above -Started on antibiotics, drain placement -Will need repeat imaging at some point  History of rheumatoid arthritis -Stable    Medications     amLODipine  5 mg Oral Daily   enoxaparin (LOVENOX) injection  40 mg Subcutaneous QHS   methylPREDNISolone (SOLU-MEDROL) injection  30 mg Intravenous BID   sodium chloride flush  5 mL Intracatheter Q8H      Data Reviewed:   CBG:  No results for input(s): "GLUCAP" in the last 168 hours.  SpO2: 99 % O2 Flow Rate (L/min): 2 L/min    Vitals:   05/16/23 1858 05/16/23 2142 05/17/23 0639 05/17/23 0958  BP: (!) 157/79 (!) 156/89 (!) 176/92 (!) 165/88  Pulse: 60 (!) 57 (!) 56 60  Resp: 16 18 18    Temp: 98 F (36.7 C) 98.2 F (36.8 C) 98.2 F (36.8 C)   TempSrc: Oral Oral Oral   SpO2: 98% 96% 99%   Weight:      Height:          Data Reviewed:  Basic Metabolic Panel: Recent Labs  Lab 05/15/23 0626 05/16/23 0724 05/17/23 0607  NA 139 137 138  K 3.4* 3.9 4.3  CL 103 102 103  CO2 28 28 26   GLUCOSE 99 127* 112*  BUN 19 14 11   CREATININE 0.99 0.86 0.85  CALCIUM 9.2 9.0 9.1  MG 1.9  --   --   PHOS 2.9  --   --     CBC: Recent Labs  Lab 05/15/23 0626 05/16/23 0724 05/17/23 0607  WBC 16.4* 18.8* 18.3*  NEUTROABS 11.0*  --   --   HGB 13.3 14.2 13.2  HCT 40.4 44.5 41.3  MCV 90.4 93.1 93.4  PLT 465* 475* 409*    LFT Recent Labs  Lab 05/15/23 0626 05/16/23 0724 05/17/23 0607  AST 11* 11* 9*  ALT 17 17 15   ALKPHOS 90 86 71  BILITOT 0.4 0.5 0.5  PROT 7.0 7.3 6.2*  ALBUMIN 3.6 3.3* 3.0*     Antibiotics: Anti-infectives (From  admission, onward)    Start     Dose/Rate Route Frequency Ordered Stop   05/15/23 1600  piperacillin-tazobactam (ZOSYN) IVPB 3.375 g        3.375 g 12.5 mL/hr over 240 Minutes Intravenous Every 8 hours 05/15/23 0847     05/15/23 0900  piperacillin-tazobactam (ZOSYN) IVPB 3.375 g        3.375 g 100 mL/hr over 30 Minutes Intravenous  Once 05/15/23 0846 05/15/23 1200        DVT prophylaxis: Lovenox  Code Status: Full code  Family Communication: Discussed with patient's mother at bedside   CONSULTS gastroenterology   Subjective   Patient seen, status post IR guided drainage of the abscess with drain placement.  Denies pain   Objective    Physical Examination:   General-appears in no acute distress Heart-S1-S2,  regular, no murmur auscultated Lungs-clear to auscultation bilaterally, no wheezing or crackles auscultated Abdomen-soft, right lower quadrant tenderness to palpation, drain in place Extremities-no edema in the lower extremities Neuro-alert, oriented x3, no focal deficit noted   Status is: Inpatient:             Meredeth Ide   [facility] If 7PM-7AM, please contact night-coverage at [email or web address], Office  [phone]   05/17/2023, 11:27 AM  LOS: 1 day

## 2023-05-17 NOTE — Plan of Care (Signed)
Pt a&ox4 and VSS. Blood pressure managed by PRN medications.  Problem: Clinical Measurements: Goal: Ability to maintain clinical measurements within normal limits will improve Outcome: Progressing   Problem: Pain Managment: Goal: General experience of comfort will improve and/or be controlled Outcome: Progressing   Problem: Safety: Goal: Ability to remain free from injury will improve Outcome: Progressing   Problem: Skin Integrity: Goal: Risk for impaired skin integrity will decrease Outcome: Progressing

## 2023-05-18 ENCOUNTER — Telehealth: Payer: Self-pay | Admitting: Physician Assistant

## 2023-05-18 DIAGNOSIS — K5900 Constipation, unspecified: Secondary | ICD-10-CM

## 2023-05-18 DIAGNOSIS — K50914 Crohn's disease, unspecified, with abscess: Secondary | ICD-10-CM | POA: Diagnosis not present

## 2023-05-18 DIAGNOSIS — K651 Peritoneal abscess: Secondary | ICD-10-CM | POA: Diagnosis not present

## 2023-05-18 DIAGNOSIS — R110 Nausea: Secondary | ICD-10-CM | POA: Diagnosis not present

## 2023-05-18 DIAGNOSIS — K50814 Crohn's disease of both small and large intestine with abscess: Secondary | ICD-10-CM | POA: Diagnosis not present

## 2023-05-18 DIAGNOSIS — E876 Hypokalemia: Secondary | ICD-10-CM | POA: Diagnosis not present

## 2023-05-18 DIAGNOSIS — D649 Anemia, unspecified: Secondary | ICD-10-CM | POA: Diagnosis not present

## 2023-05-18 DIAGNOSIS — R1031 Right lower quadrant pain: Secondary | ICD-10-CM | POA: Diagnosis not present

## 2023-05-18 LAB — COMPREHENSIVE METABOLIC PANEL
ALT: 18 U/L (ref 0–44)
AST: 13 U/L — ABNORMAL LOW (ref 15–41)
Albumin: 3.1 g/dL — ABNORMAL LOW (ref 3.5–5.0)
Alkaline Phosphatase: 79 U/L (ref 38–126)
Anion gap: 10 (ref 5–15)
BUN: 23 mg/dL — ABNORMAL HIGH (ref 6–20)
CO2: 27 mmol/L (ref 22–32)
Calcium: 9 mg/dL (ref 8.9–10.3)
Chloride: 99 mmol/L (ref 98–111)
Creatinine, Ser: 1.01 mg/dL (ref 0.61–1.24)
GFR, Estimated: >60 mL/min (ref >60)
Glucose, Bld: 91 mg/dL (ref 70–99)
Potassium: 4.1 mmol/L (ref 3.5–5.1)
Sodium: 136 mmol/L (ref 135–145)
Total Bilirubin: 0.4 mg/dL (ref 0.0–1.2)
Total Protein: 6.9 g/dL (ref 6.5–8.1)

## 2023-05-18 LAB — CBC WITH DIFFERENTIAL/PLATELET
Abs Immature Granulocytes: 0.14 10*3/uL — ABNORMAL HIGH (ref 0.00–0.07)
Basophils Absolute: 0 10*3/uL (ref 0.0–0.1)
Basophils Relative: 0 %
Eosinophils Absolute: 0 10*3/uL (ref 0.0–0.5)
Eosinophils Relative: 0 %
HCT: 44.4 % (ref 39.0–52.0)
Hemoglobin: 14.2 g/dL (ref 13.0–17.0)
Immature Granulocytes: 1 %
Lymphocytes Relative: 11 %
Lymphs Abs: 2 10*3/uL (ref 0.7–4.0)
MCH: 29.8 pg (ref 26.0–34.0)
MCHC: 32 g/dL (ref 30.0–36.0)
MCV: 93.1 fL (ref 80.0–100.0)
Monocytes Absolute: 1.4 10*3/uL — ABNORMAL HIGH (ref 0.1–1.0)
Monocytes Relative: 8 %
Neutro Abs: 14.8 10*3/uL — ABNORMAL HIGH (ref 1.7–7.7)
Neutrophils Relative %: 80 %
Platelets: 498 10*3/uL — ABNORMAL HIGH (ref 150–400)
RBC: 4.77 MIL/uL (ref 4.22–5.81)
RDW: 16.4 % — ABNORMAL HIGH (ref 11.5–15.5)
WBC: 18.3 10*3/uL — ABNORMAL HIGH (ref 4.0–10.5)
nRBC: 0 % (ref 0.0–0.2)

## 2023-05-18 MED ORDER — POLYETHYLENE GLYCOL 3350 17 G PO PACK: 17.0000 g | PACK | Freq: Every day | ORAL | Status: DC | PRN

## 2023-05-18 MED ORDER — POLYETHYLENE GLYCOL 3350 17 G PO PACK
17.0000 g | PACK | Freq: Two times a day (BID) | ORAL | Status: DC | PRN
  Administered 2023-05-18: 17 g via ORAL
  Filled 2023-05-18: qty 1

## 2023-05-18 MED ORDER — DOCUSATE SODIUM 100 MG PO CAPS
100.0000 mg | ORAL_CAPSULE | Freq: Every day | ORAL | Status: DC | PRN
  Administered 2023-05-18: 100 mg via ORAL

## 2023-05-18 MED ORDER — ACETAMINOPHEN 325 MG PO TABS
650.0000 mg | ORAL_TABLET | Freq: Four times a day (QID) | ORAL | Status: DC | PRN
  Administered 2023-05-18 – 2023-05-19 (×2): 650 mg via ORAL
  Filled 2023-05-18 (×2): qty 2

## 2023-05-18 NOTE — Telephone Encounter (Signed)
Left message for Lawson Fiscal with RA who does PA's to call me back.  Will see if RA is okay with Avsola or inflectra. 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

## 2023-05-18 NOTE — Progress Notes (Signed)
[facility]  PROGRESS NOTE  [name] XBJ:[id number] DOB: 08-20-1970 DOA: 05/15/2023 PCP: de [place], Raymond J, MD   Brief HPI:    53 y.o. Mark [name] with medical history significant of chron's disease, essential hypertension, rheumatoid arthritis, who is being managed by GI in the outpatient setting with previous surgery involving perirectal abscess.  Patient was seen and evaluated.  He has been having significant abdominal pain with nausea.  He was recently seen by GI and given prednisone taper which he has almost completed.  After completion he started having symptoms again.  Went to med [facility] where he was seen and evaluated.  At this point patient appears to have flareup of the Crohn's disease.  Patient being admitted for further evaluation and treatment.  CT confirmed terminal ileitis anterior abscess and progression of IBD.     Assessment/Plan:     Acute flare of Crohn disease/walled off abscess in right lower quadrant -CT abdomen/pelvis showed walled off abscess in the right lower quadrant -Gastroenterology consulted; currently on Solu-Medrol 30 mg IV every 12 hours -Recommended IR drainage -IR was consulted, underwent drainage of the abscess with drain placement yesterday -Continue Zosyn -GI considering to start biologic agent in the hospital  Hypokalemia -Replete  Hypertension -Restart home medications including amlodipine 5 mg daily -Will add losartan 25 mg p.o. daily -Start hydralazine as needed  Nephrolithiasis -2 mm nonobstructing stone in the left kidney upper pole -Follow-up with urology as outpatient  Right funiculitis -Likely in setting of abscess as above -Started on antibiotics, drain placement -Will need repeat imaging at some point  History of rheumatoid arthritis -Stable     Medications     amLODipine  5 mg Oral Daily   enoxaparin (LOVENOX) injection  40 mg Subcutaneous QHS   losartan  25 mg Oral Daily   methylPREDNISolone  (SOLU-MEDROL) injection  30 mg Intravenous BID   sodium chloride flush  5 mL Intracatheter Q8H     Data Reviewed:   CBG:  No results for input(s): "GLUCAP" in the last 168 hours.  SpO2: 99 % O2 Flow Rate (L/min): 2 L/min    Vitals:   05/17/23 1718 05/17/23 2122 05/18/23 0515 05/18/23 1027  BP: (!) 144/86 (!) 149/91 (!) 148/93 (!) 150/103  Pulse: 75 62 (!) 57 74  Resp:  18 18   Temp:  98.4 F (36.9 C) 98.2 F (36.8 C)   TempSrc:  Oral Oral   SpO2:  97% 99%   Weight:      Height:          Data Reviewed:  Basic Metabolic Panel: Recent Labs  Lab 05/15/23 0626 05/16/23 0724 05/17/23 0607  NA 139 137 138  K 3.4* 3.9 4.3  CL 103 102 103  CO2 28 28 26   GLUCOSE 99 127* 112*  BUN 19 14 11   CREATININE 0.99 0.86 0.85  CALCIUM 9.2 9.0 9.1  MG 1.9  --   --   PHOS 2.9  --   --     CBC: Recent Labs  Lab 05/15/23 0626 05/16/23 0724 05/17/23 0607 05/18/23 1002  WBC 16.4* 18.8* 18.3* 18.3*  NEUTROABS 11.0*  --   --  14.8*  HGB 13.3 14.2 13.2 14.2  HCT 40.4 44.5 41.3 44.4  MCV 90.4 93.1 93.4 93.1  PLT 465* 475* 409* 498*    LFT Recent Labs  Lab 05/15/23 0626 05/16/23 0724 05/17/23 0607  AST 11* 11* 9*  ALT 17 17 15   ALKPHOS 90 86 71  BILITOT 0.4 0.5 0.5  PROT 7.0 7.3 6.2*  ALBUMIN 3.6 3.3* 3.0*     Antibiotics: Anti-infectives (From admission, onward)    Start     Dose/Rate Route Frequency Ordered Stop   05/15/23 1600  piperacillin-tazobactam (ZOSYN) IVPB 3.375 g        3.375 g 12.5 mL/hr over 240 Minutes Intravenous Every 8 hours 05/15/23 0847     05/15/23 0900  piperacillin-tazobactam (ZOSYN) IVPB 3.375 g        3.375 g 100 mL/hr over 30 Minutes Intravenous  Once 05/15/23 0846 05/15/23 1200        DVT prophylaxis: Lovenox  Code Status: Full code  Family Communication: Discussed with patient's mother at bedside   CONSULTS gastroenterology   Subjective   Denies any complaints.  Pain well-controlled   Objective    Physical  Examination:   General-appears in no acute distress Heart-S1-S2, regular, no murmur auscultated Lungs-clear to auscultation bilaterally, no wheezing or crackles auscultated Abdomen-soft, nontender, no organomegaly, drain in place Extremities-no edema in the lower extremities Neuro-alert, oriented x3, no focal deficit noted   Status is: Inpatient:             Meredeth Ide   [facility] If 7PM-7AM, please contact night-coverage at [email or web address], Office  [phone]   05/18/2023, 10:37 AM  LOS: 2 days

## 2023-05-18 NOTE — Plan of Care (Signed)
  Problem: Education: Goal: Knowledge of General Education information will improve Description: Including pain rating scale, medication(s)/side effects and non-pharmacologic comfort measures 05/18/2023 1611 by Edward Jolly, RN Outcome: Progressing 05/18/2023 1611 by Edward Jolly, RN Outcome: Progressing   Problem: Health Behavior/Discharge Planning: Goal: Ability to manage health-related needs will improve 05/18/2023 1611 by Edward Jolly, RN Outcome: Progressing 05/18/2023 1611 by Edward Jolly, RN Outcome: Progressing   Problem: Clinical Measurements: Goal: Ability to maintain clinical measurements within normal limits will improve 05/18/2023 1611 by Edward Jolly, RN Outcome: Progressing 05/18/2023 1611 by Edward Jolly, RN Outcome: Progressing Goal: Will remain free from infection 05/18/2023 1611 by Edward Jolly, RN Outcome: Progressing 05/18/2023 1611 by Edward Jolly, RN Outcome: Progressing Goal: Diagnostic test results will improve 05/18/2023 1611 by Edward Jolly, RN Outcome: Progressing 05/18/2023 1611 by Edward Jolly, RN Outcome: Progressing Goal: Respiratory complications will improve 05/18/2023 1611 by Edward Jolly, RN Outcome: Progressing 05/18/2023 1611 by Edward Jolly, RN Outcome: Progressing Goal: Cardiovascular complication will be avoided 05/18/2023 1611 by Edward Jolly, RN Outcome: Progressing 05/18/2023 1611 by Edward Jolly, RN Outcome: Progressing   Problem: Activity: Goal: Risk for activity intolerance will decrease 05/18/2023 1611 by Edward Jolly, RN Outcome: Progressing 05/18/2023 1611 by Edward Jolly, RN Outcome: Progressing   Problem: Nutrition: Goal: Adequate nutrition will be maintained 05/18/2023 1611 by Edward Jolly, RN Outcome: Progressing 05/18/2023 1611 by Edward Jolly, RN Outcome: Progressing   Problem: Coping: Goal: Level of anxiety will decrease 05/18/2023 1611 by Edward Jolly, RN Outcome:  Progressing 05/18/2023 1611 by Edward Jolly, RN Outcome: Progressing   Problem: Elimination: Goal: Will not experience complications related to bowel motility 05/18/2023 1611 by Edward Jolly, RN Outcome: Progressing 05/18/2023 1611 by Edward Jolly, RN Outcome: Progressing Goal: Will not experience complications related to urinary retention 05/18/2023 1611 by Edward Jolly, RN Outcome: Progressing 05/18/2023 1611 by Edward Jolly, RN Outcome: Progressing   Problem: Pain Managment: Goal: General experience of comfort will improve and/or be controlled 05/18/2023 1611 by Edward Jolly, RN Outcome: Progressing 05/18/2023 1611 by Edward Jolly, RN Outcome: Progressing   Problem: Safety: Goal: Ability to remain free from injury will improve 05/18/2023 1611 by Edward Jolly, RN Outcome: Progressing 05/18/2023 1611 by Edward Jolly, RN Outcome: Progressing   Problem: Skin Integrity: Goal: Risk for impaired skin integrity will decrease 05/18/2023 1611 by Edward Jolly, RN Outcome: Progressing 05/18/2023 1611 by Edward Jolly, RN Outcome: Progressing

## 2023-05-18 NOTE — Telephone Encounter (Signed)
Lawson Fiscal called back, with his RA, his providers prefer Remicade over biosimilars, they have submitted PA for RA/fistulating crohn's, pending approval after appeal.  They will contact our office when they hear back about appeal.

## 2023-05-18 NOTE — Progress Notes (Signed)
Progress Note  Primary GI: Dr. Barron Alvine DOA: 05/15/2023         Hospital Day: 4   Subjective  Chief Complaint: Lower abdominal pain with abscess, Crohn's disease   No family was present at the time of my evaluation.  Patient's status post IR drain right lower quadrant 02/13, doing well from a pain stand point, he is up and walking, he is eating regular food and tolerating well.  No nausea, vomiting, fever, chills.  He is passing gas but has not had a BM yet, had once dose of colace.     Objective   Vital signs in last 24 hours: Temp:  [98.2 F (36.8 C)-98.6 F (37 C)] 98.2 F (36.8 C) (02/14 0515) Pulse Rate:  [57-98] 74 (02/14 1027) Resp:  [18] 18 (02/14 0515) BP: (144-182)/(86-103) 150/103 (02/14 1027) SpO2:  [97 %-99 %] 99 % (02/14 0515) Last BM Date : 05/15/23 Last BM recorded by nurses in past 5 days No data recorded  General:   Pleasant, well developed male in no acute distress Heart:  regular rate and rhythm, no murmurs or gallops Pulm: Clear anteriorly; no wheezing Abdomen: Soft abdomen, nondistended, active bowel sounds, now with right lower quadrant JP drain with serosanguineous/pustulant small volume drainage, mild tenderness at the site but no peritoneal signs. Extremities:  Without edema. Msk:  Symmetrical without gross deformities. Peripheral pulses intact.  Neurologic:  Alert and  oriented x4;  No focal deficits.  Skin:   Dry and intact without significant lesions or rashes. Psychiatric:  Cooperative. Normal mood and affect.    Intake/Output from previous day: 02/13 0701 - 02/14 0700 In: 10  Out: 10 [Drains:10] Intake/Output this shift: No intake/output data recorded.  Studies/Results: IR US ABSCESS DRAIN PLACEMENT Result Date: 05/16/2023 INDICATION: 53 year old male with new diagnosis of Crohn's disease, right lower quadrant abdominal wall abscess referred for drainage. EXAM: IMAGE GUIDED ABSCESS DRAIN RIGHT LOWER QUADRANT MEDICATIONS: None  ANESTHESIA/SEDATION: Moderate (conscious) sedation was employed during this procedure. A total of Versed 2.0 mg and Fentanyl 100 mcg was administered intravenously by the radiology nurse. Total intra-service moderate Sedation Time: 10 minutes. The patient's level of consciousness and vital signs were monitored continuously by radiology nursing throughout the procedure under my direct supervision. COMPLICATIONS: None PROCEDURE: Informed written consent was obtained from the patient after a thorough discussion of the procedural risks, benefits and alternatives. All questions were addressed. Maximal Sterile Barrier Technique was utilized including caps, mask, sterile gowns, sterile gloves, sterile drape, hand hygiene and skin antiseptic. A timeout was performed prior to the initiation of the procedure. Patient was positioned supine on the ultrasound stretcher. Ultrasound images were performed with images stored and sent to PACs. The patient was prepped and draped in the usual sterile fashion. 1% lidocaine was used for local anesthesia. Small stab incision was made with 11 blade scalpel. Using ultrasound guidance, trocar technique was used to place a 10 [place] drain into the fluid collection of the anterior abdominal wall. Once the drain was in position approximately 35 cc of frankly purulent material was aspirated. Sample was sent for culture. Approximately 50 cc of sterile saline used for irrigation. The drain was then attached to bulb suction. Drain was sutured in position and attached to bulb suction. Patient tolerated the procedure well and remained hemodynamically stable throughout. No complications were encountered and no significant blood loss. IMPRESSION: Status post ultrasound-guided abscess drainage of right lower quadrant abdominal wall abscess. Signed, Yvone Neu. Loreta Ave, DO, ABVM, RPVI  [facility] Radiology Electronically Signed   By: Gilmer Mor D.O.   On:  05/16/2023 15:48    Lab Results: Recent Labs    05/16/23 0724 05/17/23 0607  WBC 18.8* 18.3*  HGB 14.2 13.2  HCT 44.5 41.3  PLT 475* 409*   BMET Recent Labs    05/16/23 0724 05/17/23 0607  NA 137 138  K 3.9 4.3  CL 102 103  CO2 28 26  GLUCOSE 127* 112*  BUN 14 11  CREATININE 0.86 0.85  CALCIUM 9.0 9.1   LFT Recent Labs    05/17/23 0607  PROT 6.2*  ALBUMIN 3.0*  AST 9*  ALT 15  ALKPHOS 71  BILITOT 0.5   PT/INR Recent Labs    05/16/23 1024  LABPROT 13.6  INR 1.0     Scheduled Meds:  amLODipine  5 mg Oral Daily   enoxaparin (LOVENOX) injection  40 mg Subcutaneous QHS   losartan  25 mg Oral Daily   methylPREDNISolone (SOLU-MEDROL) injection  30 mg Intravenous BID   sodium chloride flush  5 mL Intracatheter Q8H   Continuous Infusions:  piperacillin-tazobactam (ZOSYN)  IV 3.375 g (05/18/23 0800)      Patient profile:   53 year old male with history of RA on Enbrel, hidradenitis, Crohn's disease, recently diagnosed in December treated with antibiotics and taper but had worsening abdominal discomfort once stopping steroids/antibiotics with developed 60 Demeter walled off abscess right lower quadrant with 9 cm segment of ileal inflammation.   Impression/Plan:   Crohn's Disease with history of perirectal abscess now with development of intraabdominal abscess with associated AB pain/nausea 03/31/2023 TB GOLD Negative 03/31/2023  HepBsAG NON REACTIVE  05/11/2023 CRP 2.9  02/14 WBC 18.3 HGB 14.2 Platelets 498 (02/13 WBC 18.3 HGB 13.2 Platelets 409) (2/12 WBC 18.8 HGB 14.2 MCV 93.1 ) (2/11 WBC 16.4 HGB 13.3 MCV 90.4)  Status post IR drain 2/12, culture shows rare gram positive cocci in clusters, rare gram positive rods, abundant klebsiella pneumoniae culture reincubated for better growth- WBC unchanged, no fever, could be worsened with steroid use, continue zosyn and narrow ABX with culture.  - drain decreasing amount, still with bloody/pus  draining ?-Continue Solumedrol IV 30 mg BID -continue lovenox -May need outpatient general surgery consult but currently no need for surgical intervention -continue soft diet, tolerating well - pain well controlled, infection is pending culture and sensitivity, with continuing elevated WBC, may need to add additional ABX for klebsiella versus switching, consider switching to PO steroid tomorrow, will likely need more time on IV antibiotics once narrowed down. - monitor CBC daily    Patient was previously been on Humira and Rinvoq for his rheumatoid arthritis, currently on Enbrel with continuing inflammation and abscess formation.  Rheumatologist Azucena Fallen, [place] had initiated Remicade prior authorization, our nurse has contacted them and I will reach out again today, still uncertain the timing of Remicaide initiation but we want to be able to start the infusion outpatient on discharge.  Will need EGD colonoscopy at some point, currently scheduled outpatient 3/10  Constipation Likely secondary to pain medications, still passing flautus Add on miralax BID PRN Continue to walk/move Try to limit pain medications   Normocytic anemia 05/17/2023  HGB 13.2 MCV 93.4 Platelets 409 05/16/2023 Iron 60 Ferritin 107 B12 274 Start on B12 Recent Labs    06/20/22 1237 11/16/22 0924 03/23/23 0356 03/30/23 1125 03/31/23 0523 05/15/23 0626 05/16/23 0724 05/17/23 0607  HGB 12.5* 13.6 12.7* 13.5 11.5* 13.3 14.2  13.2     Principal Problem:   RLQ abdominal pain Active Problems:   Primary hypertension   Rheumatoid arthritis (HCC)   Crohn's disease with abscess (HCC)   Hypokalemia   Leucocytosis   Nephrolithiasis   Intra-abdominal abscess (HCC)   Colonic diverticular abscess    LOS: 2 days   Doree Albee  05/18/2023, 10:30 AM

## 2023-05-18 NOTE — Telephone Encounter (Signed)
Great, thank you Marchelle Folks. Do you know if we can use biosimilars for inpatient? I wonder if they have stopped Remicade but we could give Avsola or Inflectra inpatient?

## 2023-05-18 NOTE — Plan of Care (Signed)

## 2023-05-18 NOTE — Telephone Encounter (Signed)
-----   Message from Benancio Deeds sent at 05/18/2023  7:28 AM EST ----- Regarding: RE: Patient of Antionette Poles, thanks [place]er. ----- Message ----- From: Javier Glazier Sent: 05/17/2023   6:47 PM EST To: Benancio Deeds, MD Subject: RE: Patient of Nestor Lewandowsky, PA and Dr. Nickola Major, I think. [facility] Rheumot[facility], not in epic. I can call tomorrow.  Marchelle Folks  ----- Message ----- From: Benancio Deeds, MD Sent: 05/17/2023   6:43 PM EST To: Doree Albee, PA-C; Shellia Cleverly, DO Subject: RE: Patient of cirgliano                       Thanks Marchelle Folks. Sander Nephew, they won't accept Avsola or inflectra? I'd be fine with either of those for his Crohn's. If his rheumatologist is okay with it we could just go with that. Who is his rheumatologist - anyone in [facility]? ----- Message ----- From: Javier Glazier Sent: 05/17/2023   4:40 PM EST To: Benancio Deeds, MD; Shellia Cleverly, DO Subject: FW: Patient of cirgliano                        ----- Message ----- From: Chrystie Nose, RN Sent: 05/17/2023   1:59 PM EST To: Doree Albee, PA-C Subject: RE: Patient of cirgliano                       Spoke with Lawson Fiscal at [facility], she does the PA's. She states she put in an appeal yesterday. Remicade is not preferred with his [employer]. Inflectra or Avsola are preferred but do not work for his Rheum diagnosis. She is waiting to hear back from the appeal. ----- Message ----- From: Doree Albee, PA-C Sent: 05/17/2023  12:30 PM EST To: Chrystie Nose, RN Subject: RE: Patient of cirgliano                       Can we call rhematologist and see where she is, can not get inpatient.  Thanks, Marchelle Folks ----- Message ----- From: Chrystie Nose, RN Sent: 05/17/2023  10:30 AM EST To: Doree Albee, PA-C Subject: RE: Patient of cirgliano                       I would think it would be the pharmacist at the  hospital. ----- Message ----- From: Javier Glazier Sent: 05/17/2023  10:24 AM EST To: Chrystie Nose, RN Subject: Patient of cirgliano                           Patient of cirgliano, here for crohn's flare with fistula/abscess, was getting PA for remicaide with RA doctor, Dr. Maple Hudson.  Need to try to get PA for inpatient, is that something we do or do I talk with pharmacist here?  Thanks [employer]

## 2023-05-19 ENCOUNTER — Other Ambulatory Visit: Payer: Self-pay | Admitting: Physician Assistant

## 2023-05-19 DIAGNOSIS — K50114 Crohn's disease of large intestine with abscess: Secondary | ICD-10-CM

## 2023-05-19 DIAGNOSIS — R1031 Right lower quadrant pain: Secondary | ICD-10-CM | POA: Diagnosis not present

## 2023-05-19 DIAGNOSIS — K50914 Crohn's disease, unspecified, with abscess: Secondary | ICD-10-CM | POA: Diagnosis not present

## 2023-05-19 DIAGNOSIS — M069 Rheumatoid arthritis, unspecified: Secondary | ICD-10-CM | POA: Diagnosis not present

## 2023-05-19 LAB — CBC WITH DIFFERENTIAL/PLATELET
Abs Immature Granulocytes: 0.17 10*3/uL — ABNORMAL HIGH (ref 0.00–0.07)
Basophils Absolute: 0 10*3/uL (ref 0.0–0.1)
Basophils Relative: 0 %
Eosinophils Absolute: 0 10*3/uL (ref 0.0–0.5)
Eosinophils Relative: 0 %
HCT: 44.9 % (ref 39.0–52.0)
Hemoglobin: 14.2 g/dL (ref 13.0–17.0)
Immature Granulocytes: 1 %
Lymphocytes Relative: 9 %
Lymphs Abs: 1.4 10*3/uL (ref 0.7–4.0)
MCH: 29.6 pg (ref 26.0–34.0)
MCHC: 31.6 g/dL (ref 30.0–36.0)
MCV: 93.7 fL (ref 80.0–100.0)
Monocytes Absolute: 1.4 10*3/uL — ABNORMAL HIGH (ref 0.1–1.0)
Monocytes Relative: 9 %
Neutro Abs: 13.4 10*3/uL — ABNORMAL HIGH (ref 1.7–7.7)
Neutrophils Relative %: 81 %
Platelets: 475 10*3/uL — ABNORMAL HIGH (ref 150–400)
RBC: 4.79 MIL/uL (ref 4.22–5.81)
RDW: 16.6 % — ABNORMAL HIGH (ref 11.5–15.5)
WBC: 16.4 10*3/uL — ABNORMAL HIGH (ref 4.0–10.5)
nRBC: 0 % (ref 0.0–0.2)

## 2023-05-19 LAB — AEROBIC/ANAEROBIC CULTURE W GRAM STAIN (SURGICAL/DEEP WOUND)

## 2023-05-19 LAB — COMPREHENSIVE METABOLIC PANEL
ALT: 25 U/L (ref 0–44)
AST: 16 U/L (ref 15–41)
Albumin: 3.3 g/dL — ABNORMAL LOW (ref 3.5–5.0)
Alkaline Phosphatase: 77 U/L (ref 38–126)
Anion gap: 7 (ref 5–15)
BUN: 27 mg/dL — ABNORMAL HIGH (ref 6–20)
CO2: 28 mmol/L (ref 22–32)
Calcium: 8.9 mg/dL (ref 8.9–10.3)
Chloride: 100 mmol/L (ref 98–111)
Creatinine, Ser: 0.92 mg/dL (ref 0.61–1.24)
GFR, Estimated: >60 mL/min (ref >60)
Glucose, Bld: 98 mg/dL (ref 70–99)
Potassium: 4.2 mmol/L (ref 3.5–5.1)
Sodium: 135 mmol/L (ref 135–145)
Total Bilirubin: 0.4 mg/dL (ref 0.0–1.2)
Total Protein: 6.5 g/dL (ref 6.5–8.1)

## 2023-05-19 NOTE — Plan of Care (Signed)

## 2023-05-19 NOTE — Plan of Care (Signed)

## 2023-05-19 NOTE — Progress Notes (Signed)
 [facility]  PROGRESS NOTE  Mark [name] ZOX:[id number] DOB: 12/23/1970 DOA: 05/15/2023 PCP: de [place], Raymond J, MD   Brief HPI:    53 y.o. male with medical history significant of chron's disease, essential hypertension, rheumatoid arthritis, who is being managed by GI in the outpatient setting with previous surgery involving perirectal abscess.  Patient was seen and evaluated.  He has been having significant abdominal pain with nausea.  He was recently seen by GI and given prednisone taper which he has almost completed.  After completion he started having symptoms again.  Went to med [facility] where he was seen and evaluated.  At this point patient appears to have flareup of the Crohn's disease.  Patient being admitted for further evaluation and treatment.  CT confirmed terminal ileitis anterior abscess and progression of IBD.     Assessment/Plan:     Acute flare of Crohn disease/walled off abscess in right lower quadrant -CT abdomen/pelvis showed walled off abscess in the right lower quadrant -Gastroenterology consulted;  -Recommended IR drainage -IR was consulted, underwent drainage of the abscess with drain placement  -Abscess culture growing Klebsiella, final sensitivity pending -Continue Zosyn -GI considering to start biologic agent in the hospital -Currently on Solu-Medrol 30 mg IV twice daily  Hypokalemia -Replete  Hypertension -Restart home medications including amlodipine 5 mg daily -Added losartan 25 mg p.o. daily -Start hydralazine as needed  Nephrolithiasis -2 mm nonobstructing stone in the left kidney upper pole -Follow-up with urology as outpatient  Right funiculitis -Likely in setting of abscess as above -Started on antibiotics, drain placement -Will need repeat imaging at some point  History of rheumatoid arthritis -Stable     Medications     amLODipine  5 mg Oral Daily   enoxaparin (LOVENOX) injection  40 mg Subcutaneous QHS    losartan  25 mg Oral Daily   methylPREDNISolone (SOLU-MEDROL) injection  30 mg Intravenous BID   sodium chloride flush  5 mL Intracatheter Q8H     Data Reviewed:   CBG:  No results for input(s): "GLUCAP" in the last 168 hours.  SpO2: 95 % O2 Flow Rate (L/min): 2 L/min    Vitals:   05/18/23 1027 05/18/23 1339 05/18/23 2022 05/19/23 0431  BP: (!) 150/103 (!) 148/87 127/81 134/85  Pulse: 74 77 79 69  Resp:  20 17 16   Temp:  98 F (36.7 C) 98 F (36.7 C) 98 F (36.7 C)  TempSrc:  Oral Oral Oral  SpO2:  97% 98% 95%  Weight:      Height:          Data Reviewed:  Basic Metabolic Panel: Recent Labs  Lab 05/15/23 0626 05/16/23 0724 05/17/23 0607 05/18/23 1002 05/19/23 0755  NA 139 137 138 136 135  K 3.4* 3.9 4.3 4.1 4.2  CL 103 102 103 99 100  CO2 28 28 26 27 28   GLUCOSE 99 127* 112* 91 98  BUN 19 14 11  23* 27*  CREATININE 0.99 0.86 0.85 1.01 0.92  CALCIUM 9.2 9.0 9.1 9.0 8.9  MG 1.9  --   --   --   --   PHOS 2.9  --   --   --   --     CBC: Recent Labs  Lab 05/15/23 0626 05/16/23 0724 05/17/23 0607 05/18/23 1002 05/19/23 0755  WBC 16.4* 18.8* 18.3* 18.3* 16.4*  NEUTROABS 11.0*  --   --  14.8* 13.4*  HGB 13.3 14.2 13.2 14.2 14.2  HCT 40.4 44.5  41.3 44.4 44.9  MCV 90.4 93.1 93.4 93.1 93.7  PLT 465* 475* 409* 498* 475*    LFT Recent Labs  Lab 05/15/23 0626 05/16/23 0724 05/17/23 0607 05/18/23 1002 05/19/23 0755  AST 11* 11* 9* 13* 16  ALT 17 17 15 18 25   ALKPHOS 90 86 71 79 77  BILITOT 0.4 0.5 0.5 0.4 0.4  PROT 7.0 7.3 6.2* 6.9 6.5  ALBUMIN 3.6 3.3* 3.0* 3.1* 3.3*     Antibiotics: Anti-infectives (From admission, onward)    Start     Dose/Rate Route Frequency Ordered Stop   05/15/23 1600  piperacillin-tazobactam (ZOSYN) IVPB 3.375 g        3.375 g 12.5 mL/hr over 240 Minutes Intravenous Every 8 hours 05/15/23 0847     05/15/23 0900  piperacillin-tazobactam (ZOSYN) IVPB 3.375 g        3.375 g 100 mL/hr over 30 Minutes Intravenous  Once  05/15/23 0846 05/15/23 1200        DVT prophylaxis: Lovenox  Code Status: Full code  Family Communication: Discussed with patient's mother at bedside   CONSULTS gastroenterology   Subjective    Patient is feeling better, denies abdominal pain.  Objective    Physical Examination:    General-appears in no acute distress Heart-S1-S2, regular, no murmur auscultated Lungs-clear to auscultation bilaterally, no wheezing or crackles auscultated Abdomen-soft, nontender, no organomegaly Extremities-no edema in the lower extremities Neuro-alert, oriented x3, no focal deficit noted  Status is: Inpatient:             Meredeth Ide   [facility] If 7PM-7AM, please contact night-coverage at [email or web address], Office  [phone]   05/19/2023, 10:01 AM  LOS: 3 days

## 2023-05-19 NOTE — Discharge Instructions (Signed)
 Interventional Radiology Percutaneous Abscess Drain Placement After Care  This sheet gives you information about how to care for yourself after your procedure. Your health care provider may also give you more specific instructions. Your drain was placed by an interventional radiologist with [facility]. If you have questions or concerns, contact [facility] at [phone]. What is a percutaneous drain? A drain is a small plastic tube (catheter) that goes into the fluid collection in your body through your skin. How long will I need the drain? How long the drain needs to stay in is determined by where the drain is, how much comes out of the drain each day and if you are having any other surgical procedures. Interventional radiology will determine when it is time to remove the drain. It is important to follow up as directed so that the drain can be removed as soon as it is safe to do so. What can I expect after the procedure? After the procedure, it is common to have: A small amount of bruising and discomfort in the area where the drainage tube (catheter) was placed. Sleepiness and fatigue. This should go away after the medicines you were given have worn off. Follow these instructions at home: Insertion site care Check your insertion site when you change the bandage. Check for: More redness, swelling, or pain. More fluid or blood. Warmth. Pus or a bad smell. When caring for your insertion site: Wash your hands with soap and water for at least 20 seconds before and after you change your bandage (dressing). If soap and water are not available, use hand sanitizer. You do not need to change your dressing everyday if it is clean and dry. Change your dressing every 3 days or as needed when it is soiled, wet or becoming dislodged. You will need to change your dressing each time you shower. Leave stitches (sutures), skin glue, or adhesive strips in place. These skin closures may need  to stay in place for 2 weeks or longer. If adhesive strip edges start to loosen and curl up, you may trim the loose edges. Do not remove adhesive strips completely unless your health care provider tells you to do so.  Catheter care Flush the catheter once per day with 5 mL of 0.9% normal saline unless you are told otherwise by your healthcare provider. This helps to prevent clogs in the catheter. To disconnect the drain, turn the clear plastic tube to the left. Attach the saline syringe by placing it on the white end of the drain and turning gently to the right. Once attached gently push the plunger to the 5 mL mark. After you are done flushing, disconnect the syringe by turning to the left and reattach your drainage container    If you have a bulb please be sure the bulb is charged after reconnecting it - to do this pinch the bulb between your thumb and first finger and close the stopper located on the top of the bulb.   Check for fluid leaking from around your catheter (instead of fluid draining through your catheter). This may be a sign that the drain is no longer working correctly. Write down the following information every time you empty your bag: The date and time. The amount of drainage. Activity Rest at home for 1-2 days after your procedure. For the first 48 hours do not lift anything more than 10 lbs (about a gallon of milk). You may perform moderate activities/exercise. Please avoid strenuous activities during this  time. Avoid any activities which may pull on your drain as this can cause your drain to become dislodged. If you were given a sedative during the procedure, it can affect you for several hours. Do not drive or operate machinery until your health care provider says that it is safe. General instructions For mild pain take over-the-counter medications as needed for pain such as Tylenol or Advil. If you are experiencing severe pain please call our office as this may indicate an  issue with your drain.  If you were prescribed an antibiotic medicine, take it as told by your health care provider. Do not stop using the antibiotic even if you start to feel better. You may shower 24 hours after the drain is placed. To do this cover the insertion site with a water tight material such as saran wrap and seal the edges with tape, you may also purchase waterproof dressings at your local drug store. Shower as usual and then remove the water tight dressing and any gauze/tape underneath it once you have exited the shower and dried off. Allow the area to air dry or pat dry with a clean towel. Once the skin is completely dry place a new gauze dressing. It is important to keep the site dry at all times to prevent infection. Do not submerge the drain - this means you cannot take baths, swim, use a hot tub, etc. until the drain is removed.  Do not use any products that contain nicotine or tobacco, such as cigarettes, e-cigarettes, and chewing tobacco. If you need help quitting, ask your health care provider. Keep all follow-up visits as told by your health care provider. This is important. Contact a health care provider if: You have less than 10 mL of drainage a day for 2-3 days in a row, or as directed by your health care provider. You have any of these signs of infection: More redness, swelling, or pain around your incision area. More fluid or blood coming from your incision area. Warmth coming from your incision area. Pus or a bad smell coming from your incision area. You have fluid leaking from around your catheter (instead of through your catheter). You are unable to flush the drain. You have a fever or chills. You have pain that does not get better with medicine. You have not been contacted to schedule a drain follow up appointment within 10 days of discharge from the hospital. Please call [facility] at [phone] with any questions or concerns. Get help right away  if: Your catheter comes out. You suddenly stop having drainage from your catheter. You suddenly have blood in the fluid that is draining from your catheter. You become dizzy or you faint. You develop a rash. You have nausea or vomiting. You have difficulty breathing or you feel short of breath. You develop chest pain. You have problems with your speech or vision. You have trouble balancing or moving your arms or legs. Summary It is common to have a small amount of bruising and discomfort in the area where the drainage tube (catheter) was placed. You may also have minor discomfort with movement while the drain is in place. Flush the drain once per day with 5 mL of 0.9% normal saline (unless you were told otherwise by your healthcare provider).  Record the amount of drainage from the bag every time you empty it. Change the dressing every 3 days or earlier if soiled/wet. Keep the skin dry under the dressing. You may shower with  the drain in place. Do not submerge the drain (no baths, swimming, hot tubs, etc.). Contact [facility] at [phone] if you have more redness, swelling, or pain around your incision area or if you have pain that does not get better with medicine. This information is not intended to replace advice given to you by your health care provider. Make sure you discuss any questions you have with your health care provider. Document Revised: 06/23/2021 Document Reviewed: 03/15/2019 Elsevier Patient Education  2023 [employer] Inc[employer]    Interventional Radiology Drain Record Empty your drain at least once per day. You may empty it as often as needed. Use this form to write down the amount of fluid that has collected in the drainage container. Bring this form with you to your follow-up visits. Please call [facility] at [phone] with any questions or concerns prior to your appointment. Drain #1 location: ___________________ Date __________ Time __________  Amount __________ Date __________ Time __________ Amount __________ Date __________ Time __________ Amount __________ Date __________ Time __________ Amount __________ Date __________ Time __________ Amount __________ Date __________ Time __________ Amount __________ Date __________ Time __________ Amount __________ Date __________ Time __________ Amount __________ Date __________ Time __________ Amount __________ Date __________ Time __________ Amount __________ Date __________ Time __________ Amount __________ Date __________ Time __________ Amount __________ Date __________ Time __________ Amount __________ Date __________ Time __________ Amount __________     Horace Porteous #2 location: ___________________ Date __________ Time __________ Amount __________ Date __________ Time __________ Amount __________ Date __________ Time __________ Amount __________ Date __________ Time __________ Amount __________ Date __________ Time __________ Amount __________ Date __________ Time __________ Amount __________ Date __________ Time __________ Amount __________ Date __________ Time __________ Amount __________ Date __________ Time __________ Amount __________ Date __________ Time __________ Amount __________ Date __________ Time __________ Amount __________ Date __________ Time __________ Amount __________ Date __________ Time __________ Amount __________ Date __________ Time __________ Amount __________

## 2023-05-19 NOTE — Progress Notes (Signed)
 Referring Provider(s): Collier,A,PA-C   Supervising Physician: Ruel Favors  Patient Status:  [facility] - In-pt  Chief Complaint:  Abdominal pain/abscess; referred for image guided drainage of abdominal abscess   Brief History:  Mark [name] is a 53 y.o. male with past medical history significant for hypertension, rheumatoid arthritis (on enbrel), hydradenitis suppurativa, Crohn's disease with history of perirectal abscess status post I&D 07/2018.   He had recent hospitalization 12/27 through 12/30 with abdominal pain, nausea, vomiting and diagnosed with terminal ileitis, SBO and suspected newly diagnosed Crohn's disease .    He was treated with IV Zosyn and IV Solu-Medrol and subsequently discharged home on Augmentin and prednisone taper.    He presents now with worsening abdominal pain, nausea.  CT showed 1. Interval progression of patient's known inflammatory bowel disease involving the terminal ileum with a walled-off abscess in the right lower quadrant, anteriorly. 2. Multiple other observations (such as bilateral renal cysts, 2 mm nonobstructing calculus in the left kidney upper pole, bilateral symmetric sacroiliitis, secondary right-sided funiculitis etc.  He underwent image guided right abdominal abscess drain placement by Dr. Loreta Ave on 05/16/23  Subjective:  Doing better. No complaints.  Allergies: Oxycodone and Peanut-containing drug products  Medications: Prior to Admission medications   Medication Sig Start Date End Date Taking? Authorizing Provider  acetaminophen (TYLENOL) 500 MG tablet Take 500 mg by mouth every 6 (six) hours as needed.   Yes [provider]  amLODipine (NORVASC) 5 MG tablet Take 1 tablet (5 mg total) by mouth daily. 04/02/23  Yes Noralee Stain, DO  dicyclomine (BENTYL) 20 MG tablet Take 1 tablet (20 mg total) by mouth 2 (two) times daily as needed for spasms (abdominal cramping). 05/09/23  Yes Cirigliano, Vito V, DO  ENBREL  SURECLICK 50 MG/ML injection Inject 50 mg into the skin every Monday. 06/15/18  Yes [provider]  predniSONE (DELTASONE) 10 MG tablet Take 4 tablets (40 mg total) by mouth daily with breakfast for 7 days, THEN 3 tablets (30 mg total) daily with breakfast for 7 days, THEN 2 tablets (20 mg total) daily with breakfast for 7 days, THEN 1 tablet (10 mg total) daily with breakfast for 7 days. While waiting Remicade approval. 05/10/23 06/07/23 Yes Cirigliano, Vito V, DO  docusate sodium (COLACE) 100 MG capsule Take 1 capsule (100 mg total) by mouth every 12 (twelve) hours. Patient not taking: Reported on 05/03/2023 03/23/23   Mesner, Barbara Cower, MD  Na Sulfate-K Sulfate-Mg Sulf (SUPREP BOWEL PREP KIT) 17.5-3.13-1.6 GM/177ML SOLN Take 1 kit by mouth as directed. Patient not taking: Reported on 05/03/2023 04/11/23   Cirigliano, Verlin Dike, DO     Vital Signs: BP 134/85 (BP Location: Left Arm)   Pulse 69   Temp 98 F (36.7 C) (Oral)   Resp 16   Ht 5\' 10"  (1.778 m)   Wt 160 lb (72.6 kg)   SpO2 95%   BMI 22.96 kg/m   Physical Exam Constitutional:      Appearance: Normal appearance.  HENT:     Head: Normocephalic and atraumatic.  Cardiovascular:     Rate and Rhythm: Normal rate.  Pulmonary:     Effort: Pulmonary effort is normal. No respiratory distress.  Abdominal:     Palpations: Abdomen is soft.     Tenderness: There is no abdominal tenderness.  Skin:    General: Skin is warm and dry.  Neurological:     General: No focal deficit present.     Mental Status: He is  alert and oriented to person, place, and time.  Psychiatric:        Mood and Affect: Mood normal.        Behavior: Behavior normal.        Thought Content: Thought content normal.        Judgment: Judgment normal.   Drain Location: Right lower abdomen Size: Fr size: 10 Fr Date of placement: 05/16/23  Currently to: Drain collection device: suction bulb 24 hour output:  Output by Drain (mL) 05/17/23 0701 - 05/17/23 1900 05/17/23  1901 - 05/18/23 0700 05/18/23 0701 - 05/18/23 1900 05/18/23 1901 - 05/19/23 0700 05/19/23 0701 - 05/19/23 1443  Closed System Drain 1 Right RLQ Bulb (JP) 10.2 Fr. 10  15 10      Current examination: Flushes/aspirates easily.  Insertion site unremarkable. Suture and stat lock in place. Dressed appropriately.    Labs:  CBC: Recent Labs    05/16/23 0724 05/17/23 0607 05/18/23 1002 05/19/23 0755  WBC 18.8* 18.3* 18.3* 16.4*  HGB 14.2 13.2 14.2 14.2  HCT 44.5 41.3 44.4 44.9  PLT 475* 409* 498* 475*    COAGS: Recent Labs    05/16/23 1024  INR 1.0    BMP: Recent Labs    05/16/23 0724 05/17/23 0607 05/18/23 1002 05/19/23 0755  NA 137 138 136 135  K 3.9 4.3 4.1 4.2  CL 102 103 99 100  CO2 28 26 27 28   GLUCOSE 127* 112* 91 98  BUN 14 11 23* 27*  CALCIUM 9.0 9.1 9.0 8.9  CREATININE 0.86 0.85 1.01 0.92  GFRNONAA >60 >60 >60 >60    LIVER FUNCTION TESTS: Recent Labs    05/16/23 0724 05/17/23 0607 05/18/23 1002 05/19/23 0755  BILITOT 0.5 0.5 0.4 0.4  AST 11* 9* 13* 16  ALT 17 15 18 25   ALKPHOS 86 71 79 77  PROT 7.3 6.2* 6.9 6.5  ALBUMIN 3.3* 3.0* 3.1* 3.3*    Assessment and Plan:  Right lower quadrant abscess - s/p drain placement by Dr. Loreta Ave 05/16/23  I have demonstrated drain care to patient. I have also entered outpatient follow up order. IR Scheduler will call with appt date and time.   I have entered drain care instructions in discharge section.   Daily flushes with 3 cc NS.   Record output    Dressing changes QD or PRN if soiled.    Repeat imaging/possible drain injection once output < 10 mL/QD (excluding flush material).    Consideration for drain removal if output is < 10 mL/QD (excluding flush material), pending discussion with the providing surgical service.   Discharge planning:   Patient will follow up with IR clinic 10-14 days post d/c for repeat imaging/possible drain injection.    IR scheduler will contact patient with date/time  of appointment.    Patient will need to flush drain QD with 3 cc NS, record output QD, dressing changes every 2-3 days or earlier if soil  Electronically Signed: Gwynneth Macleod, PA-C 05/19/2023, 1:00 PM    I spent a total of 15 Minutes at the the patient's bedside AND on the patient's hospital floor or unit, greater than 50% of which was counseling/coordinating care for f/u drain.

## 2023-05-19 NOTE — Progress Notes (Signed)
 CROSS COVER [facility] Subjective: Patient seems to be doing better today. He is anxious to go home.IR placed a drain in a RLQ abscess complicating Crohn's disease. Klebsiella growing in cultures from the abscess; senstivities are pending.It is difficult to get biologics started in the hospital. Currently doing well on Solumedrol 30 mg BID IV.   Objective: Vital signs in last 24 hours: Temp:  [98 F (36.7 C)] 98 F (36.7 C) (02/15 0431) Pulse Rate:  [69-79] 69 (02/15 0431) Resp:  [16-20] 16 (02/15 0431) BP: (127-150)/(81-103) 134/85 (02/15 0431) SpO2:  [95 %-98 %] 95 % (02/15 0431) Last BM Date : 05/18/23  Intake/Output from previous day: 02/14 0701 - 02/15 0700 In: 250 [P.O.:240] Out: 425 [Urine:400; Drains:25] Intake/Output this shift: No intake/output data recorded.  General appearance: alert, cooperative, appears stated age, fatigued, and no distress Resp: clear to auscultation bilaterally Cardio: regular rate and rhythm, S1, S2 normal, no murmur, click, rub or gallop GI: soft, non-tender; bowel sounds normal; no masses,  no organomegaly  Lab Results: Recent Labs    05/17/23 0607 05/18/23 1002  WBC 18.3* 18.3*  HGB 13.2 14.2  HCT 41.3 44.4  PLT 409* 498*   BMET Recent Labs    05/17/23 0607 05/18/23 1002  NA 138 136  K 4.3 4.1  CL 103 99  CO2 26 27  GLUCOSE 112* 91  BUN 11 23*  CREATININE 0.85 1.01  CALCIUM 9.1 9.0   LFT Recent Labs    05/18/23 1002  PROT 6.9  ALBUMIN 3.1*  AST 13*  ALT 18  ALKPHOS 79  BILITOT 0.4   PT/INR Recent Labs    05/16/23 1024  LABPROT 13.6  INR 1.0   Studies/Results: No results found.  Medications: I have reviewed the patient's current medications. Prior to Admission:  Medications Prior to Admission  Medication Sig Dispense Refill Last Dose/Taking   acetaminophen (TYLENOL) 500 MG tablet Take 500 mg by mouth every 6 (six) hours as needed.   05/15/2023 at  4:00 AM   amLODipine (NORVASC) 5 MG tablet Take 1 tablet (5 mg  total) by mouth daily. 90 tablet 1 05/14/2023 Morning   dicyclomine (BENTYL) 20 MG tablet Take 1 tablet (20 mg total) by mouth 2 (two) times daily as needed for spasms (abdominal cramping). 90 tablet 1 05/13/2023   ENBREL SURECLICK 50 MG/ML injection Inject 50 mg into the skin every Monday.   Past Week   predniSONE (DELTASONE) 10 MG tablet Take 4 tablets (40 mg total) by mouth daily with breakfast for 7 days, THEN 3 tablets (30 mg total) daily with breakfast for 7 days, THEN 2 tablets (20 mg total) daily with breakfast for 7 days, THEN 1 tablet (10 mg total) daily with breakfast for 7 days. While waiting Remicade approval. 100 tablet 0 05/14/2023   docusate sodium (COLACE) 100 MG capsule Take 1 capsule (100 mg total) by mouth every 12 (twelve) hours. (Patient not taking: Reported on 05/03/2023) 60 capsule 0    Na Sulfate-K Sulfate-Mg Sulf (SUPREP BOWEL PREP KIT) 17.5-3.13-1.6 GM/177ML SOLN Take 1 kit by mouth as directed. (Patient not taking: Reported on 05/03/2023) 354 mL 0    Scheduled:  amLODipine  5 mg Oral Daily   enoxaparin (LOVENOX) injection  40 mg Subcutaneous QHS   losartan  25 mg Oral Daily   methylPREDNISolone (SOLU-MEDROL) injection  30 mg Intravenous BID   sodium chloride flush  5 mL Intracatheter Q8H   Continuous:  piperacillin-tazobactam (ZOSYN)  IV 3.375 g (05/19/23 1553)  ZOX:[id number], hydrALAZINE, morphine injection, ondansetron (ZOFRAN) IV, polyethylene glycol  Assessment/Plan: 1) Acute flareup of Crohn's disease with RLQ abscess s/p drain placement by IR.Cultures growing Klebsiella-senstivities pending. 2) HTN. 3) Nephrolithiasis. 4) Rheumatoid arthritis. 5) Right funiculitis.  LOS: 3 days   Mark [name] 05/19/2023, 8:22 AM

## 2023-05-20 ENCOUNTER — Other Ambulatory Visit (HOSPITAL_BASED_OUTPATIENT_CLINIC_OR_DEPARTMENT_OTHER): Payer: Self-pay

## 2023-05-20 DIAGNOSIS — K5720 Diverticulitis of large intestine with perforation and abscess without bleeding: Secondary | ICD-10-CM | POA: Diagnosis not present

## 2023-05-20 DIAGNOSIS — K651 Peritoneal abscess: Secondary | ICD-10-CM | POA: Diagnosis not present

## 2023-05-20 DIAGNOSIS — R1031 Right lower quadrant pain: Secondary | ICD-10-CM | POA: Diagnosis not present

## 2023-05-20 DIAGNOSIS — E876 Hypokalemia: Secondary | ICD-10-CM | POA: Diagnosis not present

## 2023-05-20 LAB — CBC WITH DIFFERENTIAL/PLATELET
Abs Immature Granulocytes: 0.13 10*3/uL — ABNORMAL HIGH (ref 0.00–0.07)
Basophils Absolute: 0 10*3/uL (ref 0.0–0.1)
Basophils Relative: 0 %
Eosinophils Absolute: 0 10*3/uL (ref 0.0–0.5)
Eosinophils Relative: 0 %
HCT: 48.5 % (ref 39.0–52.0)
Hemoglobin: 15.4 g/dL (ref 13.0–17.0)
Immature Granulocytes: 1 %
Lymphocytes Relative: 11 %
Lymphs Abs: 2 10*3/uL (ref 0.7–4.0)
MCH: 29.9 pg (ref 26.0–34.0)
MCHC: 31.8 g/dL (ref 30.0–36.0)
MCV: 94.2 fL (ref 80.0–100.0)
Monocytes Absolute: 0.9 10*3/uL (ref 0.1–1.0)
Monocytes Relative: 5 %
Neutro Abs: 15.1 10*3/uL — ABNORMAL HIGH (ref 1.7–7.7)
Neutrophils Relative %: 83 %
Platelets: 520 10*3/uL — ABNORMAL HIGH (ref 150–400)
RBC: 5.15 MIL/uL (ref 4.22–5.81)
RDW: 16.6 % — ABNORMAL HIGH (ref 11.5–15.5)
WBC: 18.2 10*3/uL — ABNORMAL HIGH (ref 4.0–10.5)
nRBC: 0 % (ref 0.0–0.2)

## 2023-05-20 LAB — COMPREHENSIVE METABOLIC PANEL
ALT: 45 U/L — ABNORMAL HIGH (ref 0–44)
AST: 30 U/L (ref 15–41)
Albumin: 3.4 g/dL — ABNORMAL LOW (ref 3.5–5.0)
Alkaline Phosphatase: 87 U/L (ref 38–126)
Anion gap: 8 (ref 5–15)
BUN: 29 mg/dL — ABNORMAL HIGH (ref 6–20)
CO2: 28 mmol/L (ref 22–32)
Calcium: 9.2 mg/dL (ref 8.9–10.3)
Chloride: 101 mmol/L (ref 98–111)
Creatinine, Ser: 0.95 mg/dL (ref 0.61–1.24)
GFR, Estimated: >60 mL/min (ref >60)
Glucose, Bld: 107 mg/dL — ABNORMAL HIGH (ref 70–99)
Potassium: 4.3 mmol/L (ref 3.5–5.1)
Sodium: 137 mmol/L (ref 135–145)
Total Bilirubin: 0.3 mg/dL (ref 0.0–1.2)
Total Protein: 7.1 g/dL (ref 6.5–8.1)

## 2023-05-20 MED ORDER — AMOXICILLIN-POT CLAVULANATE 875-125 MG PO TABS: 1.0000 | ORAL_TABLET | Freq: Two times a day (BID) | ORAL | 0 refills | Status: AC

## 2023-05-20 MED ORDER — POLYETHYLENE GLYCOL 3350 17 G PO PACK: 17.0000 g | PACK | Freq: Two times a day (BID) | ORAL | 0 refills | Status: DC | PRN

## 2023-05-20 MED ORDER — PREDNISONE 20 MG PO TABS: 40.0000 mg | ORAL_TABLET | Freq: Every day | ORAL | 1 refills | Status: DC

## 2023-05-20 MED ORDER — SODIUM CHLORIDE 0.9% FLUSH: 3.0000 mL | Freq: Three times a day (TID) | INTRAVENOUS | 0 refills | Status: DC

## 2023-05-20 MED ORDER — AMOXICILLIN-POT CLAVULANATE 875-125 MG PO TABS
1.0000 | ORAL_TABLET | Freq: Two times a day (BID) | ORAL | 0 refills | Status: DC
  Filled 2023-05-20: qty 28, 14d supply, fill #0

## 2023-05-20 MED ORDER — LOSARTAN POTASSIUM 25 MG PO TABS
25.0000 mg | ORAL_TABLET | Freq: Every day | ORAL | 3 refills | Status: DC
  Filled 2023-05-20: qty 30, 30d supply, fill #0

## 2023-05-20 MED ORDER — AMLODIPINE BESYLATE 5 MG PO TABS: 5.0000 mg | ORAL_TABLET | Freq: Every day | ORAL | 1 refills | Status: DC

## 2023-05-20 MED ORDER — TRAMADOL HCL 50 MG PO TABS: 50.0000 mg | ORAL_TABLET | Freq: Four times a day (QID) | ORAL | 0 refills | Status: DC | PRN

## 2023-05-20 MED ORDER — PREDNISONE 20 MG PO TABS
40.0000 mg | ORAL_TABLET | Freq: Every day | ORAL | 1 refills | Status: DC
  Filled 2023-05-20: qty 60, 30d supply, fill #0

## 2023-05-20 MED ORDER — LOSARTAN POTASSIUM 25 MG PO TABS: 25.0000 mg | ORAL_TABLET | Freq: Every day | ORAL | 3 refills | Status: DC

## 2023-05-20 MED ORDER — POLYETHYLENE GLYCOL 3350 17 G PO PACK
17.0000 g | PACK | Freq: Two times a day (BID) | ORAL | 0 refills | Status: DC | PRN
  Filled 2023-05-20: qty 14, 7d supply, fill #0

## 2023-05-20 NOTE — Plan of Care (Signed)

## 2023-05-20 NOTE — TOC Transition Note (Signed)
 Transition of Care [facility]) - Discharge Note   Patient Details  Name: Mark [name] MRN: [id number] Date of Birth: 1970-08-26  Transition of Care [facility]) CM/SW Contact:  Diona Browner, LCSW Phone Number: 05/20/2023, 11:53 AM   Clinical Narrative:     Pt d/c home w/ self care. No TOC needs.   Final next level of care: Home/Self Care Barriers to Discharge: No Barriers Identified   Patient Goals and CMS Choice Patient states their goals for this hospitalization and ongoing recovery are:: return home          Discharge Placement                       Discharge Plan and Services Additional resources added to the After Visit Summary for                  DME Arranged: N/A DME Agency: NA                  Social Drivers of Health (SDOH) Interventions SDOH Screenings   Food Insecurity: No Food Insecurity (05/15/2023)  Housing: Low Risk  (05/15/2023)  Transportation Needs: No Transportation Needs (05/15/2023)  Utilities: Not At Risk (05/15/2023)  Depression (PHQ2-9): Low Risk  (05/03/2023)  Social Connections: Unknown (08/15/2021)   Received from [facility], [facility]  Tobacco Use: High Risk (05/15/2023)     Readmission Risk Interventions    05/20/2023   11:52 AM  Readmission Risk Prevention Plan  Transportation Screening Complete  PCP or Specialist Appt within 5-7 Days Complete  Home Care Screening Complete  Medication Review (RN CM) Complete

## 2023-05-20 NOTE — Discharge Summary (Signed)
 Physician Discharge Summary   Patient: Mark [name] MRN: [id number] DOB: 08/12/1970  Admit date:     05/15/2023  Discharge date: 05/20/23  Discharge Physician: Meredeth Ide   PCP: [place], Buren Kos, MD   Recommendations at discharge:   Follow-up IR as outpatient Follow-up gastroenterology as outpatient Continue daily flushes with 3 cc normal saline for abdominal drain  Discharge Diagnoses: Principal Problem:   RLQ abdominal pain Active Problems:   Primary hypertension   Rheumatoid arthritis (HCC)   Crohn's disease with abscess (HCC)   Hypokalemia   Leucocytosis   Nephrolithiasis   Intra-abdominal abscess (HCC)   Colonic diverticular abscess  Resolved Problems:   * No resolved hospital problems. *  Hospital Course: 53 y.o. male with medical history significant of chron's disease, essential hypertension, rheumatoid arthritis, who is being managed by GI in the outpatient setting with previous surgery involving perirectal abscess.  Patient was seen and evaluated.  He has been having significant abdominal pain with nausea.  He was recently seen by GI and given prednisone taper which he has almost completed.  After completion he started having symptoms again.  Went to med [facility] where he was seen and evaluated.  At this point patient appears to have flareup of the Crohn's disease.  Patient being admitted for further evaluation and treatment.  CT confirmed terminal ileitis anterior abscess and progression of IBD   Assessment and Plan:  Acute flare of Crohn disease/walled off abscess in right lower quadrant -CT abdomen/pelvis showed walled off abscess in the right lower quadrant -Gastroenterology consulted;  -Recommended IR drainage -IR was consulted, underwent drainage of the abscess with drain placement  -Abscess culture growing Klebsiella, E. coli, sensitive to Zosyn and ampicillin sulbactam -Patient received Zosyn for 5 days in the hospital -Will discharge on  Augmentin 1 tablet p.o. twice daily for 2 weeks -Will also continue prednisone 40 mg daily -Follow-up gastroenterology as outpatient -Follow-up IR as outpatient -Continue daily flushes of the drain as directed  Hypokalemia -Replete   Hypertension -Continue amlodipine 5 mg daily -Also started on losartan 25 mg daily   Nephrolithiasis -2 mm nonobstructing stone in the left kidney upper pole -Follow-up with urology as outpatient   Right funiculitis -Likely in setting of abscess as above -Started on antibiotics, drain placement -Follow-up urology   History of rheumatoid arthritis -Stable -Started on prednisone           Consultants: Gastroenterology, IR Procedures performed: IR guided drainage of the right lower quadrant abscess Disposition: Home Diet recommendation:  Discharge Diet Orders (From admission, onward)     Start     Ordered   05/20/23 0000  Diet - low sodium heart healthy        05/20/23 1128           Regular diet DISCHARGE MEDICATION: Allergies as of 05/20/2023       Reactions   Oxycodone Other (See Comments)   "Causes severe abdominal pain"   Peanut-containing Drug Products Itching, Rash        Medication List     STOP taking these medications    docusate sodium 100 MG capsule Commonly known as: COLACE   Na Sulfate-K Sulfate-Mg Sulfate concentrate 17.5-3.13-1.6 GM/177ML Soln Commonly known as: Suprep Bowel Prep Kit       TAKE these medications    acetaminophen 500 MG tablet Commonly known as: TYLENOL Take 500 mg by mouth every 6 (six) hours as needed.   amLODipine 5 MG tablet  Commonly known as: NORVASC Take 1 tablet (5 mg total) by mouth daily.   amoxicillin-clavulanate 875-125 MG tablet Commonly known as: AUGMENTIN Take 1 tablet by mouth 2 (two) times daily for 14 days.   dicyclomine 20 MG tablet Commonly known as: BENTYL Take 1 tablet (20 mg total) by mouth 2 (two) times daily as needed for spasms (abdominal  cramping).   Enbrel SureClick 50 MG/ML injection Generic drug: etanercept Inject 50 mg into the skin every Monday.   losartan 25 MG tablet Commonly known as: COZAAR Take 1 tablet (25 mg total) by mouth daily. Start taking on: May 21, 2023   polyethylene glycol 17 g packet Commonly known as: MIRALAX / GLYCOLAX Take 17 g by mouth 2 (two) times daily as needed for moderate constipation or mild constipation.   predniSONE 20 MG tablet Commonly known as: DELTASONE Take 2 tablets (40 mg total) by mouth daily. What changed:  medication strength See the new instructions.   sodium chloride flush 0.9 % Soln Commonly known as: NS 3 mLs by Intracatheter route every 8 (eight) hours.        Follow-up Information     [place], Buren Kos, MD Follow up in 1 week(s).   Specialty: Family Medicine Contact information: [address] [place] [address] [phone]         Benancio Deeds, MD. Schedule an appointment as soon as possible for a visit.   Specialty: Gastroenterology Contact information: [address] [employer] [address] [place] [address] [phone]                Discharge Exam: Ceasar Mons Weights   05/15/23 5284  Weight: 72.6 kg   General-appears in no acute distress Heart-S1-S2, regular, no murmur auscultated Lungs-clear to auscultation bilaterally, no wheezing or crackles auscultated Abdomen-soft, nontender, no organomegaly Extremities-no edema in the lower extremities Neuro-alert, oriented x3, no focal deficit noted  Condition at discharge: good  The results of significant diagnostics from this hospitalization (including imaging, microbiology, ancillary and laboratory) are listed below for reference.   Imaging Studies: IR US ABSCESS DRAIN PLACEMENT Result Date: 05/16/2023 INDICATION: 53 year old male with new diagnosis of Crohn's disease, right lower quadrant abdominal wall abscess referred for drainage. EXAM: IMAGE GUIDED ABSCESS DRAIN  RIGHT LOWER QUADRANT MEDICATIONS: None ANESTHESIA/SEDATION: Moderate (conscious) sedation was employed during this procedure. A total of Versed 2.0 mg and Fentanyl 100 mcg was administered intravenously by the radiology nurse. Total intra-service moderate Sedation Time: 10 minutes. The patient's level of consciousness and vital signs were monitored continuously by radiology nursing throughout the procedure under my direct supervision. COMPLICATIONS: None PROCEDURE: Informed written consent was obtained from the patient after a thorough discussion of the procedural risks, benefits and alternatives. All questions were addressed. Maximal Sterile Barrier Technique was utilized including caps, mask, sterile gowns, sterile gloves, sterile drape, hand hygiene and skin antiseptic. A timeout was performed prior to the initiation of the procedure. Patient was positioned supine on the ultrasound stretcher. Ultrasound images were performed with images stored and sent to PACs. The patient was prepped and draped in the usual sterile fashion. 1% lidocaine was used for local anesthesia. Small stab incision was made with 11 blade scalpel. Using ultrasound guidance, trocar technique was used to place a 10 [place] drain into the fluid collection of the anterior abdominal wall. Once the drain was in position approximately 35 cc of frankly purulent material was aspirated. Sample was sent for culture. Approximately 50 cc of sterile saline used for irrigation.  The drain was then attached to bulb suction. Drain was sutured in position and attached to bulb suction. Patient tolerated the procedure well and remained hemodynamically stable throughout. No complications were encountered and no significant blood loss. IMPRESSION: Status post ultrasound-guided abscess drainage of right lower quadrant abdominal wall abscess. Signed, Yvone Neu. Miachel Roux, RPVI [facility] Electronically  Signed   By: Gilmer Mor D.O.   On: 05/16/2023 15:48   CT ABDOMEN PELVIS W CONTRAST Result Date: 05/15/2023 CLINICAL DATA:  RLQ abdominal pain. EXAM: CT ABDOMEN AND PELVIS WITH CONTRAST TECHNIQUE: Multidetector CT imaging of the abdomen and pelvis was performed using the standard protocol following bolus administration of intravenous contrast. RADIATION DOSE REDUCTION: This exam was performed according to the departmental dose-optimization program which includes automated exposure control, adjustment of the mA and/or kV according to patient size and/or use of iterative reconstruction technique. CONTRAST:  85mL OMNIPAQUE IOHEXOL 300 MG/ML  SOLN COMPARISON:  CT scan abdomen and pelvis from 03/30/2023. FINDINGS: Lower chest: The lung bases are clear. No pleural effusion. The heart is normal in size. No pericardial effusion. Hepatobiliary: The liver is normal in size. Non-cirrhotic configuration. No suspicious mass. No intrahepatic or extrahepatic bile duct dilation. No calcified gallstones. Normal gallbladder wall thickness. No pericholecystic inflammatory changes. Pancreas: Unremarkable. No pancreatic ductal dilatation or surrounding inflammatory changes. Spleen: Within normal limits. No focal lesion. Adrenals/Urinary Tract: Adrenal glands are unremarkable. No suspicious renal mass. There is a 5.1 x 5.2 cm partially exophytic simple cyst arising from the left kidney lower pole. There are several subcentimeter sized hypoattenuating foci in the right kidney, which are too small to adequately characterize but favored to represent cysts as well. No hydronephrosis. There is a 2 mm nonobstructing calculus in the left kidney upper pole. No other renal or ureteric calculi. Unremarkable urinary bladder. Stomach/Bowel: No disproportionate dilation of the small or large bowel loops. Appendix is visualized and appears unremarkable. There is irregular/asymmetric circumferential thickening of approximately 9 cm length of  terminal ileum, which appears grossly similar to the prior study. 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 There are few intervening incomplete septations. Findings favor right lower quadrant abscess secondary to inflammatory bowel disease. Vascular/Lymphatic: No ascites or pneumoperitoneum. No abdominal or pelvic lymphadenopathy, by size criteria. No aneurysmal dilation of the major abdominal arteries. There are mild-to-moderate atherosclerotic vascular calcifications of the aorta and its major branches. Reproductive: Normal size prostate. Symmetric seminal vesicles. Other: There is mild asymmetric hyperemia and fat stranding in the right inguinal region/spermatic cord, most likely secondary to above-mentioned abscess. The soft tissues and abdominal wall are otherwise unremarkable. Musculoskeletal: No suspicious osseous lesions. There are mild multilevel degenerative changes in the visualized spine. Redemonstration of changes of bilateral sacroiliitis. IMPRESSION: 1. Interval progression of patient's known inflammatory bowel disease involving the terminal ileum with a walled-off abscess in the right lower quadrant, anteriorly. 2. Multiple other observations (such as bilateral renal cysts, 2 mm nonobstructing calculus in the left kidney upper pole, bilateral symmetric sacroiliitis, secondary right-sided funiculitis etc.), as described above. Aortic Atherosclerosis (ICD10-I70.0). Electronically Signed   By: Jules Schick M.D.   On: 05/15/2023 08:24    Microbiology: Results for orders placed or performed during the hospital encounter of 05/15/23  Aerobic/Anaerobic Culture w Gram Stain (surgical/deep wound)  Status: None   Collection Time: 05/16/23  4:01 PM   Specimen: Abscess  Result Value Ref  Range Status   Specimen Description ABSCESS  Final   Special Requests ABDOMINAL WALL  Final   Gram Stain   Final    ABUNDANT WBC PRESENT, PREDOMINANTLY PMN RARE GRAM POSITIVE COCCI IN CLUSTERS RARE GRAM POSITIVE RODS    Culture   Final    ABUNDANT ESCHERICHIA COLI ABUNDANT KLEBSIELLA PNEUMONIAE Confirmed Extended Spectrum Beta-Lactamase Producer (ESBL).  In bloodstream infections from ESBL organisms, carbapenems are preferred over piperacillin/tazobactam. They are shown to have a lower risk of mortality. MODERATE EIKENELLA CORRODENS Usually susceptible to penicillin and other beta lactam agents,quinolones,macrolides and tetracyclines. MODERATE BACTEROIDES THETAIOTAOMICRON BETA LACTAMASE POSITIVE Performed at [facility], [address], [place], [place] [address]    Report Status 05/19/2023 FINAL  Final   Organism ID, Bacteria ESCHERICHIA COLI  Final   Organism ID, Bacteria KLEBSIELLA PNEUMONIAE  Final      Susceptibility   Escherichia coli - MIC*    AMPICILLIN <=2 SENSITIVE Sensitive     CEFEPIME <=0.12 SENSITIVE Sensitive     CEFTAZIDIME <=1 SENSITIVE Sensitive     CEFTRIAXONE <=0.25 SENSITIVE Sensitive     CIPROFLOXACIN <=0.25 SENSITIVE Sensitive     GENTAMICIN <=1 SENSITIVE Sensitive     IMIPENEM <=0.25 SENSITIVE Sensitive     TRIMETH/SULFA <=20 SENSITIVE Sensitive     AMPICILLIN/SULBACTAM <=2 SENSITIVE Sensitive     PIP/TAZO <=4 SENSITIVE Sensitive ug/mL    * ABUNDANT ESCHERICHIA COLI   Klebsiella pneumoniae - MIC*    AMPICILLIN >=32 RESISTANT Resistant     CEFEPIME 2 SENSITIVE Sensitive     CEFTAZIDIME RESISTANT Resistant     CEFTRIAXONE >=64 RESISTANT Resistant     CIPROFLOXACIN <=0.25 SENSITIVE Sensitive     GENTAMICIN <=1 SENSITIVE Sensitive     IMIPENEM <=0.25 SENSITIVE Sensitive     TRIMETH/SULFA <=20 SENSITIVE Sensitive     AMPICILLIN/SULBACTAM 8 SENSITIVE Sensitive     PIP/TAZO <=4 SENSITIVE Sensitive ug/mL    * ABUNDANT KLEBSIELLA PNEUMONIAE     Labs: CBC: Recent Labs  Lab 05/15/23 0626 05/16/23 0724 05/17/23 0607 05/18/23 1002 05/19/23 0755 05/20/23 0643  WBC 16.4* 18.8* 18.3* 18.3* 16.4* 18.2*  NEUTROABS 11.0*  --   --  14.8* 13.4* 15.1*  HGB 13.3 14.2 13.2 14.2 14.2 15.4  HCT 40.4 44.5 41.3 44.4 44.9 48.5  MCV 90.4 93.1 93.4 93.1 93.7 94.2  PLT 465* 475* 409* 498* 475* 520*   Basic Metabolic Panel: Recent Labs  Lab 05/15/23 0626 05/16/23 0724 05/17/23 0607 05/18/23 1002 05/19/23 0755 05/20/23 0643  NA 139 137 138 136 135 137  K 3.4* 3.9 4.3 4.1 4.2 4.3  CL 103 102 103 99 100 101  CO2 28 28 26 27 28 28   GLUCOSE 99 127* 112* 91 98 107*  BUN 19 14 11  23* 27* 29*  CREATININE 0.99 0.86 0.85 1.01 0.92 0.95  CALCIUM 9.2 9.0 9.1 9.0 8.9 9.2  MG 1.9  --   --   --   --   --   PHOS 2.9  --   --   --   --   --    Liver Function Tests: Recent Labs  Lab 05/16/23 0724 05/17/23 0607 05/18/23 1002 05/19/23 0755 05/20/23 0643  AST 11* 9* 13* 16 30  ALT 17 15 18 25  45*  ALKPHOS 86 71 79 77 87  BILITOT 0.5 0.5 0.4 0.4 0.3  PROT  7.3 6.2* 6.9 6.5 7.1  ALBUMIN 3.3* 3.0* 3.1* 3.3* 3.4*   CBG: No results for input(s): "GLUCAP" in the last 168 hours.  Discharge time spent: greater than 30 minutes.  Signed: Meredeth Ide, MD [facility] 05/20/2023

## 2023-05-20 NOTE — Plan of Care (Signed)
  Problem: Education: Goal: Knowledge of General Education information will improve Description: Including pain rating scale, medication(s)/side effects and non-pharmacologic comfort measures 05/20/2023 0203 by Gaynelle Arabian, RN Outcome: Progressing 05/20/2023 0150 by Gaynelle Arabian, RN Outcome: Progressing   Problem: Health Behavior/Discharge Planning: Goal: Ability to manage health-related needs will improve 05/20/2023 0203 by Gaynelle Arabian, RN Outcome: Progressing 05/20/2023 0150 by Gaynelle Arabian, RN Outcome: Progressing   Problem: Clinical Measurements: Goal: Ability to maintain clinical measurements within normal limits will improve 05/20/2023 0203 by Gaynelle Arabian, RN Outcome: Progressing 05/20/2023 0150 by Gaynelle Arabian, RN Outcome: Progressing Goal: Will remain free from infection 05/20/2023 0203 by Gaynelle Arabian, RN Outcome: Progressing 05/20/2023 0150 by Gaynelle Arabian, RN Outcome: Progressing Goal: Diagnostic test results will improve 05/20/2023 0203 by Gaynelle Arabian, RN Outcome: Progressing 05/20/2023 0150 by Gaynelle Arabian, RN Outcome: Progressing Goal: Respiratory complications will improve 05/20/2023 0203 by Gaynelle Arabian, RN Outcome: Progressing 05/20/2023 0150 by Gaynelle Arabian, RN Outcome: Progressing Goal: Cardiovascular complication will be avoided 05/20/2023 0203 by Gaynelle Arabian, RN Outcome: Progressing 05/20/2023 0150 by Gaynelle Arabian, RN Outcome: Progressing   Problem: Activity: Goal: Risk for activity intolerance will decrease 05/20/2023 0203 by Gaynelle Arabian, RN Outcome: Progressing 05/20/2023 0150 by Gaynelle Arabian, RN Outcome: Progressing   Problem: Nutrition: Goal: Adequate nutrition will be maintained 05/20/2023 0203 by Gaynelle Arabian, RN Outcome: Progressing 05/20/2023 0150 by Gaynelle Arabian, RN Outcome: Progressing   Problem: Coping: Goal: Level of anxiety will  decrease 05/20/2023 0203 by Gaynelle Arabian, RN Outcome: Progressing 05/20/2023 0150 by Gaynelle Arabian, RN Outcome: Progressing   Problem: Elimination: Goal: Will not experience complications related to bowel motility 05/20/2023 0203 by Gaynelle Arabian, RN Outcome: Progressing 05/20/2023 0150 by Gaynelle Arabian, RN Outcome: Progressing Goal: Will not experience complications related to urinary retention 05/20/2023 0203 by Gaynelle Arabian, RN Outcome: Progressing 05/20/2023 0150 by Gaynelle Arabian, RN Outcome: Progressing   Problem: Pain Managment: Goal: General experience of comfort will improve and/or be controlled 05/20/2023 0203 by Gaynelle Arabian, RN Outcome: Progressing 05/20/2023 0150 by Gaynelle Arabian, RN Outcome: Progressing   Problem: Safety: Goal: Ability to remain free from injury will improve 05/20/2023 0203 by Gaynelle Arabian, RN Outcome: Progressing 05/20/2023 0150 by Gaynelle Arabian, RN Outcome: Progressing   Problem: Skin Integrity: Goal: Risk for impaired skin integrity will decrease 05/20/2023 0203 by Gaynelle Arabian, RN Outcome: Progressing 05/20/2023 0150 by Gaynelle Arabian, RN Outcome: Progressing

## 2023-05-20 NOTE — Plan of Care (Signed)
  Problem: Education: Goal: Knowledge of General Education information will improve Description: Including pain rating scale, medication(s)/side effects and non-pharmacologic comfort measures 05/20/2023 0204 by Gaynelle Arabian, RN Outcome: Progressing 05/20/2023 0203 by Gaynelle Arabian, RN Outcome: Progressing 05/20/2023 0150 by Gaynelle Arabian, RN Outcome: Progressing   Problem: Health Behavior/Discharge Planning: Goal: Ability to manage health-related needs will improve 05/20/2023 0204 by Gaynelle Arabian, RN Outcome: Progressing 05/20/2023 0203 by Gaynelle Arabian, RN Outcome: Progressing 05/20/2023 0150 by Gaynelle Arabian, RN Outcome: Progressing   Problem: Clinical Measurements: Goal: Ability to maintain clinical measurements within normal limits will improve 05/20/2023 0204 by Gaynelle Arabian, RN Outcome: Progressing 05/20/2023 0203 by Gaynelle Arabian, RN Outcome: Progressing 05/20/2023 0150 by Gaynelle Arabian, RN Outcome: Progressing Goal: Will remain free from infection 05/20/2023 0204 by Gaynelle Arabian, RN Outcome: Progressing 05/20/2023 0203 by Gaynelle Arabian, RN Outcome: Progressing 05/20/2023 0150 by Gaynelle Arabian, RN Outcome: Progressing Goal: Diagnostic test results will improve 05/20/2023 0204 by Gaynelle Arabian, RN Outcome: Progressing 05/20/2023 0203 by Gaynelle Arabian, RN Outcome: Progressing 05/20/2023 0150 by Gaynelle Arabian, RN Outcome: Progressing Goal: Respiratory complications will improve 05/20/2023 0204 by Gaynelle Arabian, RN Outcome: Progressing 05/20/2023 0203 by Gaynelle Arabian, RN Outcome: Progressing 05/20/2023 0150 by Gaynelle Arabian, RN Outcome: Progressing Goal: Cardiovascular complication will be avoided 05/20/2023 0204 by Gaynelle Arabian, RN Outcome: Progressing 05/20/2023 0203 by Gaynelle Arabian, RN Outcome: Progressing 05/20/2023 0150 by Gaynelle Arabian, RN Outcome: Progressing    Problem: Activity: Goal: Risk for activity intolerance will decrease 05/20/2023 0204 by Gaynelle Arabian, RN Outcome: Progressing 05/20/2023 0203 by Gaynelle Arabian, RN Outcome: Progressing 05/20/2023 0150 by Gaynelle Arabian, RN Outcome: Progressing   Problem: Nutrition: Goal: Adequate nutrition will be maintained 05/20/2023 0204 by Gaynelle Arabian, RN Outcome: Progressing 05/20/2023 0203 by Gaynelle Arabian, RN Outcome: Progressing 05/20/2023 0150 by Gaynelle Arabian, RN Outcome: Progressing   Problem: Coping: Goal: Level of anxiety will decrease 05/20/2023 0204 by Gaynelle Arabian, RN Outcome: Progressing 05/20/2023 0203 by Gaynelle Arabian, RN Outcome: Progressing 05/20/2023 0150 by Gaynelle Arabian, RN Outcome: Progressing   Problem: Elimination: Goal: Will not experience complications related to bowel motility 05/20/2023 0204 by Gaynelle Arabian, RN Outcome: Progressing 05/20/2023 0203 by Gaynelle Arabian, RN Outcome: Progressing 05/20/2023 0150 by Gaynelle Arabian, RN Outcome: Progressing Goal: Will not experience complications related to urinary retention 05/20/2023 0204 by Gaynelle Arabian, RN Outcome: Progressing 05/20/2023 0203 by Gaynelle Arabian, RN Outcome: Progressing 05/20/2023 0150 by Gaynelle Arabian, RN Outcome: Progressing   Problem: Pain Managment: Goal: General experience of comfort will improve and/or be controlled 05/20/2023 0204 by Gaynelle Arabian, RN Outcome: Progressing 05/20/2023 0203 by Gaynelle Arabian, RN Outcome: Progressing 05/20/2023 0150 by Gaynelle Arabian, RN Outcome: Progressing   Problem: Safety: Goal: Ability to remain free from injury will improve 05/20/2023 0204 by Gaynelle Arabian, RN Outcome: Progressing 05/20/2023 0203 by Gaynelle Arabian, RN Outcome: Progressing 05/20/2023 0150 by Gaynelle Arabian, RN Outcome: Progressing   Problem: Skin Integrity: Goal: Risk for impaired skin  integrity will decrease 05/20/2023 0204 by Gaynelle Arabian, RN Outcome: Progressing 05/20/2023 0203 by Gaynelle Arabian, RN Outcome: Progressing 05/20/2023 0150 by Gaynelle Arabian, RN Outcome: Progressing

## 2023-05-21 ENCOUNTER — Telehealth: Payer: Self-pay | Admitting: *Deleted

## 2023-05-21 ENCOUNTER — Telehealth (HOSPITAL_BASED_OUTPATIENT_CLINIC_OR_DEPARTMENT_OTHER): Payer: Self-pay

## 2023-05-21 NOTE — Telephone Encounter (Signed)
 I have spoken to the patient who states he is "relaxing" following his hospitalization. He denies any abdominal/rectal pain. States he has had only 1 small bowel movement since getting home and this was non-bloody. Denies any fevers or chills. +fatigue. Patient states that he is taking augmentin and prednisone 40 mg daily right now. He is aware that rheumatology has been working on appeal for name brand remicade for RA/fistulizing crohns disease.

## 2023-05-21 NOTE — Telephone Encounter (Signed)
-----   Message from Shellia Cleverly sent at 05/20/2023  1:11 PM EST ----- Regarding: RE: your patient - update Thanks for taking care of him while inpatient. We were trying to get Remicade going as outpatient as well. Hopefully can get infused this week.  Dottie, can you please call Yuki and see how he is doing and make sure we have him set up for induction and short interval follow-up?   Agree with plan to start thiopurine once infection clears.   VC ----- Message ----- From: Benancio Deeds, MD Sent: 05/18/2023   3:47 PM EST To: Doree Albee, PA-C; Shellia Cleverly, DO Subject: your patient - update                          Ferdie Ping, this patient came into [facility] this week where [place] and I took care of him.  He developed an abscess on prednisone.  S/p IR guided drainage of abscess, growing Klebsiella  Admitted for IV antibiotics, we continued IV steroids.  Planning on starting Remicade/infliximab biosimilar, the hospital would not allow [place] to do that this week (to get it inpatient is really tough now).  Marchelle Folks has been in touch with his rheumatologist, they have already been working on prior authorization and hope to get him infused next week as outpatient.  He should be going home this weekend on oral antibiotics and oral prednisone.  Wanted to keep you in the loop.  May be good to have your nurse touch base with him this week when you get back to see if he has been able to start Remicade.  Was thinking once he starts that we should add a thiopurine.  He will likely be on antibiotics for a while and IR will manage his drain.  Hope you had a good vacation, welcome back!  Brett Canales

## 2023-05-21 NOTE — Transitions of Care (Post Inpatient/ED Visit) (Unsigned)
   05/21/2023  Name: Mark [name] MRN: [id number] DOB: 09-29-1970  Today's TOC FU Call Status: Today's TOC FU Call Status:: Unsuccessful Call (1st Attempt) Unsuccessful Call (1st Attempt) Date: 05/21/23  Attempted to reach the patient regarding the most recent Inpatient/ED visit.  Follow Up Plan: Additional outreach attempts will be made to reach the patient to complete the Transitions of Care (Post Inpatient/ED visit) call.   Signature Karena Addison, LPN [facility] Nurse Health Advisor Direct Dial [phone]

## 2023-05-22 ENCOUNTER — Other Ambulatory Visit: Payer: Self-pay | Admitting: Gastroenterology

## 2023-05-22 DIAGNOSIS — K50114 Crohn's disease of large intestine with abscess: Secondary | ICD-10-CM

## 2023-05-22 NOTE — Transitions of Care (Post Inpatient/ED Visit) (Signed)
   05/22/2023  Name: Mark [name] MRN: [id number] DOB: [birth year]  Today's TOC FU Call Status: Today's TOC FU Call Status:: Successful TOC FU Call Completed Unsuccessful Call (1st Attempt) Date: 05/21/23 [facility] FU Call Complete Date: 05/22/23 Patient's Name and Date of Birth confirmed.  Transition Care Management Follow-up Telephone Call Date of Discharge: 05/20/23 Discharge Facility: [facility]) Type of Discharge: Inpatient Admission Primary Inpatient Discharge Diagnosis:: diverticulitis How have you been since you were released from the hospital?: Better Any questions or concerns?: No  Items Reviewed: Did you receive and understand the discharge instructions provided?: Yes Medications obtained,verified, and reconciled?: Yes (Medications Reviewed) Any new allergies since your discharge?: No Dietary orders reviewed?: Yes Do you have support at home?: No  Medications Reviewed Today: Medications Reviewed Today     Reviewed by Karena Addison, LPN (Licensed Practical Nurse) on 05/22/23 at 1214  Med List Status: <None>   Medication Order Taking? Sig Documenting Provider Last Dose Status Informant  acetaminophen (TYLENOL) 500 MG tablet [id number] No Take 500 mg by mouth every 6 (six) hours as needed. [provider] 05/15/2023  4:00 AM Active Self  amLODipine (NORVASC) 5 MG tablet [id number]  Take 1 tablet (5 mg total) by mouth daily. Meredeth Ide, MD  Active   amoxicillin-clavulanate (AUGMENTIN) 875-125 MG tablet [id number]  Take 1 tablet by mouth 2 (two) times daily for 14 days. Meredeth Ide, MD  Active   dicyclomine (BENTYL) 20 MG tablet [id number] No Take 1 tablet (20 mg total) by mouth 2 (two) times daily as needed for spasms (abdominal cramping). Doristine Locks V, DO 05/13/2023 Active Self  ENBREL SURECLICK 50 MG/ML injection [id number] No Inject 50 mg into the skin every Monday. [provider] Past Week Active Self  losartan (COZAAR) 25 MG tablet [id number]   Take 1 tablet (25 mg total) by mouth daily. Meredeth Ide, MD  Active   polyethylene glycol (MIRALAX / GLYCOLAX) 17 g packet [id number]  Take 17 g by mouth 2 (two) times daily as needed for moderate constipation or mild constipation. Meredeth Ide, MD  Active   predniSONE (DELTASONE) 20 MG tablet [id number]  Take 2 tablets (40 mg total) by mouth daily. Meredeth Ide, MD  Active   sodium chloride flush (NS) 0.9 % SOLN [id number]  3 mLs by Intracatheter route every 8 (eight) hours. Meredeth Ide, MD  Active   traMADol Janean Sark) 50 MG tablet [id number]  Take 1 tablet (50 mg total) by mouth every 6 (six) hours as needed. Meredeth Ide, MD  Active             Home Care and Equipment/Supplies: Were Home Health Services Ordered?: NA Any new equipment or medical supplies ordered?: NA  Functional Questionnaire: Do you need assistance with bathing/showering or dressing?: No Do you need assistance with meal preparation?: No Do you need assistance with eating?: No Do you have difficulty maintaining continence: No Do you need assistance with getting out of bed/getting out of a chair/moving?: No Do you have difficulty managing or taking your medications?: No  Follow up appointments reviewed: PCP Follow-up appointment confirmed?: Yes Date of PCP follow-up appointment?: 05/31/23 Follow-up Provider: [facility] Follow-up appointment confirmed?: NA Do you need transportation to your follow-up appointment?: No Do you understand care options if your condition(s) worsen?: Yes-patient verbalized understanding    SIGNATURE Karena Addison, LPN [facility] Nurse Health Advisor Direct Dial [phone]

## 2023-05-23 NOTE — Telephone Encounter (Signed)
 Left message for Mark [name] with [facility] (phone [phone]) asking for any updates on Remicade appeal that she initiated recently for both RA and fistulizing crohns disease.

## 2023-05-25 ENCOUNTER — Encounter: Payer: Self-pay | Admitting: *Deleted

## 2023-05-29 NOTE — Telephone Encounter (Signed)
 Left a message asking Lawson Fiscal for update on Remicade appeal from insurance if she has one.

## 2023-05-30 NOTE — Progress Notes (Signed)
 Referring Physician(s): Dr. Adela Lank  Chief Complaint: The patient is seen in follow up today s/p intra-abdominal abscess drain placed 05/16/23  History of present illness: Mark [name], 53 year old male, has a past medical history significant for HTN, RA, hidradenitis suppurativa, Crohn's disease with history of perirectal abscess.  He underwent I&D April 2020. He was briefly hospitalized December 2024 with terminal ileitis and small bowel obstruction. He was treated conservatively and discharged home with antibiotics and prednisone taper.   He presented to the ED 05/15/23 with RLQ pain and imaging showed a walled-off abscess in the anterior RLQ. GI and Surgical times were consulted. IR was ultimately consulted for drain placement and this was performed 05/16/23. Abscess cultures grew Klebsiella and E. Coli. He was discharged 05/20/23 on oral antibiotics. He presents today to the Interventional Radiology outpatient clinic for a drain evaluation with CT imaging and possible drain injection under fluoroscopy.   Past Medical History:  Diagnosis Date   Arthritis    RA   Crohn disease (HCC)    Hip fracture (HCC)    Hypertension    Rheumatoid arthritis (HCC)     Past Surgical History:  Procedure Laterality Date   HYDRADENITIS EXCISION Bilateral 09/18/2018   Procedure: EXCISION BILATERAL AXILLARY HIDRADENITIS;  Surgeon: Harriette Bouillon, MD;  Location: [facility];  Service: General;  Laterality: Bilateral;   INCISION AND DRAINAGE PERIRECTAL ABSCESS N/A 07/17/2018   Procedure: IRRIGATION AND DEBRIDEMENT PERIRECTAL ABSCESS;  Surgeon: Axel Filler, MD;  Location: [facility] OR;  Service: General;  Laterality: N/A;   IR US GUIDE BX ASP/DRAIN  05/16/2023    Allergies: Humira (2 pen) [adalimumab], Methotrexate derivatives, Rinvoq [upadacitinib], Oxycodone, and Peanut-containing drug products  Medications: Prior to Admission medications   Medication Sig Start Date End Date Taking?  Authorizing Provider  acetaminophen (TYLENOL) 500 MG tablet Take 500 mg by mouth every 6 (six) hours as needed.    [provider]  amLODipine (NORVASC) 5 MG tablet Take 1 tablet (5 mg total) by mouth daily. 05/20/23   Meredeth Ide, MD  amoxicillin-clavulanate (AUGMENTIN) 875-125 MG tablet Take 1 tablet by mouth 2 (two) times daily for 14 days. 05/20/23 06/03/23  Meredeth Ide, MD  dicyclomine (BENTYL) 20 MG tablet Take 1 tablet (20 mg total) by mouth 2 (two) times daily as needed for spasms (abdominal cramping). 05/09/23   Cirigliano, Vito V, DO  ENBREL SURECLICK 50 MG/ML injection Inject 50 mg into the skin every Monday. 06/15/18   [provider]  losartan (COZAAR) 25 MG tablet Take 1 tablet (25 mg total) by mouth daily. 05/21/23   Meredeth Ide, MD  polyethylene glycol (MIRALAX / GLYCOLAX) 17 g packet Take 17 g by mouth 2 (two) times daily as needed for moderate constipation or mild constipation. 05/20/23   Meredeth Ide, MD  predniSONE (DELTASONE) 20 MG tablet Take 2 tablets (40 mg total) by mouth daily. 05/20/23   Meredeth Ide, MD  sodium chloride flush (NS) 0.9 % SOLN 3 mLs by Intracatheter route every 8 (eight) hours. 05/20/23   Meredeth Ide, MD  traMADol (ULTRAM) 50 MG tablet Take 1 tablet (50 mg total) by mouth every 6 (six) hours as needed. 05/20/23 05/19/24  Meredeth Ide, MD     Family History  Problem Relation Age of Onset   Heart failure Mother    Aneurysm Father    Colon polyps Neg Hx    Colon cancer Neg Hx    Esophageal cancer Neg Hx  Stomach cancer Neg Hx    Rectal cancer Neg Hx     Social History   Socioeconomic History   Marital status: Divorced    Spouse name: Not on file   Number of children: Not on file   Years of education: Not on file   Highest education level: Not on file  Occupational History   Not on file  Tobacco Use   Smoking status: Some Days    Types: Cigars   Smokeless tobacco: Never  Vaping Use   Vaping status: Never Used  Substance  and Sexual Activity   Alcohol use: Not Currently   Drug use: Not Currently    Frequency: 3.0 times per week   Sexual activity: Not on file  Other Topics Concern   Not on file  Social History Narrative   Not on file   Social Drivers of Health   Financial Resource Strain: Not on file  Food Insecurity: No Food Insecurity (05/15/2023)   Hunger Vital Sign    Worried About Running Out of Food in the Last Year: Never true    Ran Out of Food in the Last Year: Never true  Transportation Needs: No Transportation Needs (05/15/2023)   PRAPARE - Administrator, Civil Service (Medical): No    Lack of Transportation (Non-Medical): No  Physical Activity: Not on file  Stress: Not on file  Social Connections: Unknown (08/15/2021)   Received from [facility]   Social Network    Social Network: Not on file     Vital Signs: There were no vitals taken for this visit.  Physical Exam Constitutional:      General: He is not in acute distress. HENT:     Head: Normocephalic.  Eyes:     General: No scleral icterus. Cardiovascular:     Rate and Rhythm: Normal rate and regular rhythm.  Pulmonary:     Effort: No respiratory distress.  Abdominal:     General: There is no distension.     Comments: RLQ drain in place with trace tan fluid in bag.  Skin:    General: Skin is warm and dry.     Coloration: Skin is not jaundiced.  Neurological:     Mental Status: He is alert and oriented to person, place, and time.     Imaging: CT AP 06/01/23   Labs:  CBC: Recent Labs    05/17/23 0607 05/18/23 1002 05/19/23 0755 05/20/23 0643  WBC 18.3* 18.3* 16.4* 18.2*  HGB 13.2 14.2 14.2 15.4  HCT 41.3 44.4 44.9 48.5  PLT 409* 498* 475* 520*    COAGS: Recent Labs    05/16/23 1024  INR 1.0    BMP: Recent Labs    05/17/23 0607 05/18/23 1002 05/19/23 0755 05/20/23 0643  NA 138 136 135 137  K 4.3 4.1 4.2 4.3  CL 103 99 100 101  CO2 26 27 28 28   GLUCOSE 112*  91 98 107*  BUN 11 23* 27* 29*  CALCIUM 9.1 9.0 8.9 9.2  CREATININE 0.85 1.01 0.92 0.95  GFRNONAA >60 >60 >60 >60    LIVER FUNCTION TESTS: Recent Labs    05/17/23 0607 05/18/23 1002 05/19/23 0755 05/20/23 0643  BILITOT 0.5 0.4 0.4 0.3  AST 9* 13* 16 30  ALT 15 18 25  45*  ALKPHOS 71 79 77 87  PROT 6.2* 6.9 6.5 7.1  ALBUMIN 3.0* 3.1* 3.3* 3.4*    Assessment and Plan: 53 year old male with a history significant  for Crohn's disease and recent terminal ileitis with small bowel obstruction. He was treated conservatively but developed an intra-abdominal abscess necessitating drain placement in IR 05/16/23.  Drainage volume has decreased significantly and imaging demonstrates resolution of fluid collection.  The drain was removed successfully.  Follow up with IR as needed.  Marliss Coots, MD Pager: [phone]    I spent a total of 25 Minutes in face to face in clinical consultation, greater than 50% of which was counseling/coordinating care for intra-abdominal abscess drain.

## 2023-05-31 ENCOUNTER — Ambulatory Visit (INDEPENDENT_AMBULATORY_CARE_PROVIDER_SITE_OTHER): Payer: 59 | Admitting: Family Medicine

## 2023-05-31 ENCOUNTER — Encounter (HOSPITAL_BASED_OUTPATIENT_CLINIC_OR_DEPARTMENT_OTHER): Payer: Self-pay | Admitting: Family Medicine

## 2023-05-31 DIAGNOSIS — N491 Inflammatory disorders of spermatic cord, tunica vaginalis and vas deferens: Secondary | ICD-10-CM | POA: Insufficient documentation

## 2023-05-31 DIAGNOSIS — K50914 Crohn's disease, unspecified, with abscess: Secondary | ICD-10-CM | POA: Diagnosis not present

## 2023-05-31 DIAGNOSIS — N200 Calculus of kidney: Secondary | ICD-10-CM

## 2023-05-31 DIAGNOSIS — I10 Essential (primary) hypertension: Secondary | ICD-10-CM

## 2023-05-31 NOTE — Assessment & Plan Note (Signed)
 Initial blood pressure reading elevated in office today.  Patient reports that he did not take his blood pressure medication at his usual time earlier this morning. Given that patient is doing well since discharge from hospital, recommend resuming regular administration of blood pressure medications and recommend intermittent monitoring of blood pressure at home

## 2023-05-31 NOTE — Assessment & Plan Note (Signed)
 Patient had recent hospitalization due to acute flare of Crohn disease with observed intra-abdominal abscess.  He ultimately underwent IR placement of intra-abdominal drain for treatment of abscess.  Abscess cultures grew Klebsiella and E. coli which were sensitive to Zosyn and ampicillin.  He received 5 days of IV antibiotics and then was discharged on Augmentin and instructed on completing 2-week course of oral antibiotic. Today, he reports that he is doing fairly well, has some discomfort around drain site, however denies any other significant abdominal pain.  No issues with fevers.  He does have appointment tomorrow with interventional radiology.  He also has upcoming appointment scheduled with gastroenterology. On exam, patient is in no acute distress, vital signs stable.  Cardiovascular exam with regular rate and rhythm, lungs clear to auscultation bilaterally.  Abdomen with normal bowel sounds, soft, nontender, nondistended.  Drain in place. Recommend proceeding with scheduled appointment with IR and gastroenterology

## 2023-05-31 NOTE — Assessment & Plan Note (Signed)
 Nonobstructing stones seen on CT scan, recommendation to have outpatient evaluation with urology in conjunction with observed funiculitis.  Referral placed today

## 2023-05-31 NOTE — Assessment & Plan Note (Signed)
 Observed on CT scan during assessment of abdominal pain.  Given presence of this as well as nonobstructing nephrolithiasis, he was recommended for patient to have outpatient evaluation with urology.  Referral placed today

## 2023-05-31 NOTE — Telephone Encounter (Signed)
 Received a voicemail back from [place] with [facility]. She states that she just received a letter from insurance yesterday that request for expedited appeal did not meet requirements, so no expedited appeal would be done. However, she says that decision should be made within 15 calendar days of letter receipt. Mark [name] states the 15 day mark will be Friday.

## 2023-05-31 NOTE — Patient Instructions (Signed)
   Medication Instructions:  Your physician recommends that you continue on your current medications as directed. Please refer to the Current Medication list given to you today. --If you need a refill on any your medications before your next appointment, please call your pharmacy first. If no refills are authorized on file call the office.--   Follow-Up: Your next appointment:   Your physician recommends that you schedule a follow-up appointment in: 2-3 month follow up  with Dr. de Peru  You will receive a text message or e-mail with a link to a survey about your care and experience with [place] today! We would greatly appreciate your feedback!   Thanks for letting us be apart of your health journey!!  Primary Care and Sports Medicine   Dr. Ceasar Mons Peru   We encourage you to activate your patient portal called "MyChart".  Sign up information is provided on this After Visit Summary.  MyChart is used to connect with patients for Virtual Visits (Telemedicine).  Patients are able to view lab/test results, encounter notes, upcoming appointments, etc.  Non-urgent messages can be sent to your provider as well. To learn more about what you can do with MyChart, please visit --  [email or web address].

## 2023-05-31 NOTE — Progress Notes (Signed)
    Procedures performed today:    None.  Independent interpretation of notes and tests performed by another provider:   None.  Brief History, Exam, Impression, and Recommendations:    BP (!) 166/93 (BP Location: Left Arm, Patient Position: Sitting, Cuff Size: Normal)   Ht 5\' 10"  (1.778 m)   Wt 176 lb 12.8 oz (80.2 kg)   BMI 25.37 kg/m   Nephrolithiasis Assessment & Plan: Nonobstructing stones seen on CT scan, recommendation to have outpatient evaluation with urology in conjunction with observed funiculitis.  Referral placed today  Orders: -     Ambulatory referral to Urology  Crohn's disease with abscess, unspecified gastrointestinal tract location [facility]) Assessment & Plan: Patient had recent hospitalization due to acute flare of Crohn disease with observed intra-abdominal abscess.  He ultimately underwent IR placement of intra-abdominal drain for treatment of abscess.  Abscess cultures grew Klebsiella and E. coli which were sensitive to Zosyn and ampicillin.  He received 5 days of IV antibiotics and then was discharged on Augmentin and instructed on completing 2-week course of oral antibiotic. Today, he reports that he is doing fairly well, has some discomfort around drain site, however denies any other significant abdominal pain.  No issues with fevers.  He does have appointment tomorrow with interventional radiology.  He also has upcoming appointment scheduled with gastroenterology. On exam, patient is in no acute distress, vital signs stable.  Cardiovascular exam with regular rate and rhythm, lungs clear to auscultation bilaterally.  Abdomen with normal bowel sounds, soft, nontender, nondistended.  Drain in place. Recommend proceeding with scheduled appointment with IR and gastroenterology   Primary hypertension Assessment & Plan: Initial blood pressure reading elevated in office today.  Patient reports that he did not take his blood pressure medication at his usual time  earlier this morning. Given that patient is doing well since discharge from hospital, recommend resuming regular administration of blood pressure medications and recommend intermittent monitoring of blood pressure at home   Funiculitis Assessment & Plan: Observed on CT scan during assessment of abdominal pain.  Given presence of this as well as nonobstructing nephrolithiasis, he was recommended for patient to have outpatient evaluation with urology.  Referral placed today  Orders: -     Ambulatory referral to Urology  Return in about 2 months (around 07/29/2023).  Spent 33 minutes on this patient encounter, including preparation, chart review, face-to-face counseling with patient and coordination of care, and documentation of encounter   ___________________________________________ Shirel Mallis de [place], MD, ABFM, [facility] Primary Care and Sports Medicine [facility]

## 2023-06-01 ENCOUNTER — Ambulatory Visit
Admission: RE | Admit: 2023-06-01 | Discharge: 2023-06-01 | Disposition: A | Discharge status: Home / Self Care | Payer: 59 | Source: Ambulatory Visit | Attending: Gastroenterology | Admitting: Gastroenterology

## 2023-06-01 ENCOUNTER — Ambulatory Visit
Admission: RE | Admit: 2023-06-01 | Discharge: 2023-06-01 | Disposition: A | Discharge status: Home / Self Care | Payer: 59 | Source: Ambulatory Visit | Attending: Physician Assistant | Admitting: Physician Assistant

## 2023-06-01 DIAGNOSIS — K50114 Crohn's disease of large intestine with abscess: Secondary | ICD-10-CM

## 2023-06-01 HISTORY — PX: IR RADIOLOGIST EVAL & MGMT: IMG5224

## 2023-06-01 MED ORDER — IOPAMIDOL (ISOVUE-300) INJECTION 61%
100.0000 mL | Freq: Once | INTRAVENOUS | Status: AC | PRN
  Administered 2023-06-01: 100 mL via INTRAVENOUS

## 2023-06-04 ENCOUNTER — Telehealth: Payer: Self-pay

## 2023-06-04 NOTE — Telephone Encounter (Signed)
 Left message for patient to return call to office to further discuss appointment.  Will continue efforts.

## 2023-06-04 NOTE — Telephone Encounter (Signed)
 Spoke with patient and made him aware that Dr Barron Alvine would like to see him in the office on 06-07-23 at 11am.  Patient agreed to appointment date and time.  I also called to [facility] and spoke with Lawson Fiscal who stated she submitted the appeal for Remicade on 05-15-23 and it would be 15 business days before the decision would be made.  She called on 05-30-23, but there had not been any final decision at that time.  She stated she will make our office aware once she gets the final word.

## 2023-06-04 NOTE — Telephone Encounter (Signed)
 PT returning call. Please advise.

## 2023-06-04 NOTE — Telephone Encounter (Signed)
-----   Message from Shellia Cleverly sent at 06/04/2023  8:12 AM EST ----- Can we reach out to this patient's Rheumatologist to see where they stand on the appeal for Remicade.  We had previously said we are okay with a biosimilar (i.e. Inflectra, Avsola), but they wanted brand-name Remicade and were pushing through the appeals process.  His abscess has resolved and drain is pulled, so we also want to get him started on 6-MP at 1 mg/kilogram daily.  I can see him on 3/6 in the AM to go through medication options, etc. and see how he is feeling.  He is scheduled with Dr. Doy Hutching for EGD/colonoscopy next week.  Can keep that appointment for now and discussed with me on 3/6 whether or not to proceed with that versus waiting for after induction with Remicade.  Thanks.

## 2023-06-07 ENCOUNTER — Ambulatory Visit: Admitting: Gastroenterology

## 2023-06-07 ENCOUNTER — Encounter: Payer: Self-pay | Admitting: Gastroenterology

## 2023-06-07 ENCOUNTER — Other Ambulatory Visit: Payer: Self-pay

## 2023-06-07 ENCOUNTER — Other Ambulatory Visit (HOSPITAL_BASED_OUTPATIENT_CLINIC_OR_DEPARTMENT_OTHER): Payer: Self-pay

## 2023-06-07 VITALS — BP 140/78 | HR 76 | Ht 69.0 in | Wt 179.0 lb

## 2023-06-07 DIAGNOSIS — N200 Calculus of kidney: Secondary | ICD-10-CM | POA: Diagnosis not present

## 2023-06-07 DIAGNOSIS — K50919 Crohn's disease, unspecified, with unspecified complications: Secondary | ICD-10-CM

## 2023-06-07 DIAGNOSIS — L732 Hidradenitis suppurativa: Secondary | ICD-10-CM

## 2023-06-07 DIAGNOSIS — M069 Rheumatoid arthritis, unspecified: Secondary | ICD-10-CM

## 2023-06-07 DIAGNOSIS — K5000 Crohn's disease of small intestine without complications: Secondary | ICD-10-CM | POA: Diagnosis not present

## 2023-06-07 MED ORDER — PREDNISONE 10 MG PO TABS
ORAL_TABLET | ORAL | 0 refills | Status: AC
  Filled 2023-06-07: qty 56, 35d supply, fill #0

## 2023-06-07 MED ORDER — AZATHIOPRINE 50 MG PO TABS
50.0000 mg | ORAL_TABLET | Freq: Every day | ORAL | 1 refills | Status: DC
  Filled 2023-06-07: qty 30, 30d supply, fill #0

## 2023-06-07 NOTE — Progress Notes (Signed)
 Chief Complaint:    Crohn's disease  GI History: 53 year old male with a history of rheumatoid arthritis (on Enbrel), HTN, hidradenitis suppurativa, perirectal abscess  s/p I&D 07/2018, recently hospitalized 12/27-30 with abdominal pain, nausea/vomiting and diagnosed with terminal ileitis, SBO, and suspected newly diagnosed Crohn's Disease.  - 03/23/2023: ER evaluation for abdominal pain, nausea/vomiting. - 03/23/2023: CT A/P: Skip areas of bowel wall thickening including in the duodenum, proximal jejunum, terminal ileum, with most pronounced inflammation at the terminal ileum with regional peritoneal thickening considered reactive peritonitis.  Treated with Cipro/Flagyl x 7 days, dicyclomine - 03/30/2023: Represented to the ER with ongoing abdominal pain and nausea/vomiting along with diarrhea.  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  No abscess.  New focal wall thickening in the distal ileum with dilated small bowel loops with fecalization concerning for partial SBO.  Chronic sacroiliitis. - Negative C. difficile, GI PCR panel - ESR 59, CRP 0.7 - Normal/negative hepatitis B (needs HBV vaccine series).  Negative QuantiFERON gold.  Normal TPMT activity - General Surgery and GI consulted and was treated medically with IV Zosyn, IV Solu-Medrol with clinical improvement.  Was transition to Augmentin and prednisone with taper  -04/11/2023: Follow-up in the GI clinic.  Was doing well with prednisone, CRP normal and ESR downtrending.  Discussed starting Remicade or Stelara pending discussion with his Rheumatologist.  Was scheduled for expedited EGD/colonoscopy, but patient was a no-show - 03/15/2023: Hospital admission with RLQ abscess s/p drain placement by IR.  Cultures grew Klebsiella.   Treated with Zosyn and Solu-Medrol and discharged with Augmentin along with prednisone 40 mg daily.   For his history of RA, has been previously treated with Humira, then Rinvoq (not efficacious), then switched to Enbrel about 1.5 years ago.  Follows with Dr. Azucena Fallen at [facility].     Endoscopic History: - 01/10/2023: Colonoscopy (indication: Routine CRC screening; was otherwise asymptomatic): Normal, small terminal hemorrhoids  HPI:     Patient is a 54 y.o. male presenting to the Gastroenterology Clinic for hospital follow-up.  Was admitted 12/12-15 with RLQ abscess requiring IR drain placement.  Was treated with Solu-Medrol and Zosyn, then converted to prednisone 40 mg/day and Augmentin at discharge.  Repeat CT on 06/01/2023 with moderate mural thickening at the TI, resolution of RLQ abscess with pigtail catheter in place, punctate left-sided nephrolithiasis.    Had f/u with IR last week. Imaging that day with resolution of abscess and no fistula noted, and drain was removed.   He reports still with some leaking at drain site but slowing each day; covered with gauze. No nausea/vomiting, diarrhea, abdominal pain. Good PO intake.   Completed Abx as prescribed. Currently taking Prednisone 40 mg/day.     Review of systems:     No chest pain, no SOB, no fevers, no urinary sx   Past Medical History:  Diagnosis Date   Arthritis    RA   Crohn disease (HCC)    Hip fracture (HCC)    Hypertension    Rheumatoid arthritis (HCC)     Patient's surgical history, family medical history, social history, medications and allergies were all reviewed in Epic    Current Outpatient Medications  Medication Sig Dispense Refill   acetaminophen (TYLENOL) 500 MG tablet Take 500 mg by mouth as needed.     amLODipine (NORVASC) 5 MG tablet  Take 1 tablet (5 mg total) by mouth daily. 90 tablet 1   ENBREL SURECLICK 50 MG/ML injection Inject 50 mg into the skin every Monday.     losartan  (COZAAR) 25 MG tablet Take 1 tablet (25 mg total) by mouth daily. 30 tablet 3   predniSONE (DELTASONE) 20 MG tablet Take 2 tablets (40 mg total) by mouth daily. 60 tablet 1   dicyclomine (BENTYL) 20 MG tablet Take 1 tablet (20 mg total) by mouth 2 (two) times daily as needed for spasms (abdominal cramping). (Patient not taking: Reported on 06/07/2023) 90 tablet 1   No current facility-administered medications for this visit.    Physical Exam:     BP (!) 140/78 (BP Location: Left Arm, Patient Position: Sitting, Cuff Size: Normal)   Pulse 76   Ht 5\' 9"  (1.753 m)   Wt 179 lb (81.2 kg)   BMI 26.43 kg/m   GENERAL:  Pleasant male in NAD PSYCH: : Cooperative, normal affect ABDOMEN: Dressing in RLQ c/d/I.  Abdomen nondistended, soft, nontender. Normal bowel sounds SKIN:  turgor, no lesions seen Musculoskeletal:  Normal muscle tone, normal strength NEURO: Alert and oriented x 3, no focal neurologic deficits   IMPRESSION and PLAN:    1) Crohn's Disease 53 year old male with recent hospital admission x2 for what seems to be newly diagnosed penetrating small bowel Crohn's Disease with ?perianal modifiers.  Good clinical response to steroids and antibiotics.  Has a history of RA, previously treated with Humira, then Rinvoq (not efficacious), and reports had been largely in remission for the last 1.5 years with Enbrel.   Interestingly, did have CT in 07/2018 which showed large complex right perianal abscess, moderate wall thickening of the terminal ileum, bilateral sacroiliitis.  Another CT in 11/2021 with wall thickening of the distal ileum with upstream short segments of luminal narrowing suspicious for small bowel Crohn's disease.  At the time of his screening colonoscopy in 01/2023, he was otherwise asymptomatic.  Colon was normal-appearing but TI not intubated.  Most recently admitted in 05/2023 with RLQ abscess requiring IR drain placement along with antibiotics and Solu-Medrol.  Good response to  therapy and IR drain was pulled last week.  Has completed antibiotics, and currently on prednisone 40 mg/day.  We again discussed the pathophysiology of Crohn's disease at length today, to include medication options with plan for the following:  - Start Prednisone taper.  Provided with Rx for taper today - Awaiting appeal response re: Remicade as submitted by Rheumatology. If not approved, I would like to move forward with biosimilar ASAP - Start Imuran at 50 mg/day and can increase to 2 mg/kg for maintenance - Discussed risks, benefits, alternatives of Biologics, immunomodulators at length today - Check CBC and BMP in 2 weeks - He is scheduled for EGD/colonoscopy next week with Dr. Doy Hutching, although he is unsure if he is up for completing bowel prep and undergoing procedure.  I will discuss with her as well - If unable to move forward with EGD/colonoscopy next week as scheduled, will plan on checking ESR, CRP, fecal calprotectin now and periodically monitoring as a surrogate marker for response to therapy  2) RA 3) Hidradenitis suppurativa - Keep close follow-up in the Rheumatology Clinic - As above, if brand-name Remicade not approved via appeal, can hopefully get biosimilar going ASAP and this will hopefully be able to treat his RA and hidradenitis  4) Nephrolithiasis Nonobstructing left-sided stones noted on recent CT - Has referral to Urology with appt later  this month  I spent 45 minutes of time, including in depth chart review, independent review of results as outlined above, communicating results with the patient directly, face-to-face time with the patient, coordinating care, ordering studies and medications as appropriate, and documentation.            Verlin Dike Rolanda Campa ,DO, FACG 06/07/2023, 11:19 AM

## 2023-06-07 NOTE — Patient Instructions (Addendum)
 We have sent the following medications to your pharmacy for you to pick up at your convenience: prednisone taper and Imuran.   Please come to our lab in the basement for lab work in 2 weeks (around 06/21/23).  _______________________________________________________  If your blood pressure at your visit was 140/90 or greater, please contact your primary care physician to follow up on this.  _______________________________________________________  If you are age 54 or older, your body mass index should be between 23-30. Your Body mass index is 26.43 kg/m. If this is out of the aforementioned range listed, please consider follow up with your Primary Care Provider.  If you are age 60 or younger, your body mass index should be between 19-25. Your Body mass index is 26.43 kg/m. If this is out of the aformentioned range listed, please consider follow up with your Primary Care Provider.   ________________________________________________________  The  GI providers would like to encourage you to use [facility] to communicate with providers for non-urgent requests or questions.  Due to long hold times on the telephone, sending your provider a message by [facility] may be a faster and more efficient way to get a response.  Please allow 48 business hours for a response.  Please remember that this is for non-urgent requests.  _______________________________________________________

## 2023-06-08 NOTE — Telephone Encounter (Signed)
 Spoke to [employer] with [facility]. They have initiated Inflectra for patient since insurance did formally deny Remicade.

## 2023-06-08 NOTE — Telephone Encounter (Signed)
 Inbound call from [place] from [facility] stating they received denial for Remicade. States the provider gave the alternative Inflectra. States patient is aware. Call back number is [phone]. Please advise, thank you.

## 2023-06-11 NOTE — Progress Notes (Deleted)
 [facility] History and Physical   Primary Care Physician:  de [place], Buren Kos, MD   Reason for Procedure:  Penetrating small bowel Crohn's disease with recent intra-abdominal abscess-restage disease activity  Plan:    Upper endoscopy and colonoscopy   HPI: Mark [name] is a 53 y.o. male undergoing upper endoscopy and colonoscopy to restage penetrating small bowel Crohn's disease complicated by recent intra-abdominal abscess.  CTAP 03/2023 showed multiple areas of skip lesions consistent with inflammation in the duodenum, proximal jejunum and terminal ileum.  Patient was treated with antibiotics and dicyclomine.  Did not show for previous EGD/colonoscopy for disease activity assessment.  He was admitted to the hospital in February 2024 with intra-abdominal abscess status post IR drain placement and antibiotics.  Last CTAP 06/01/2023 showed resolution of abscess.  Not currently on IBD medical therapy.   Past Medical History:  Diagnosis Date   Arthritis    RA   Crohn disease (HCC)    Hip fracture (HCC)    Hypertension    Rheumatoid arthritis (HCC)     Past Surgical History:  Procedure Laterality Date   HYDRADENITIS EXCISION Bilateral 09/18/2018   Procedure: EXCISION BILATERAL AXILLARY HIDRADENITIS;  Surgeon: Harriette Bouillon, MD;  Location: [facility];  Service: General;  Laterality: Bilateral;   INCISION AND DRAINAGE PERIRECTAL ABSCESS N/A 07/17/2018   Procedure: IRRIGATION AND DEBRIDEMENT PERIRECTAL ABSCESS;  Surgeon: Axel Filler, MD;  Location: [facility] OR;  Service: General;  Laterality: N/A;   IR RADIOLOGIST EVAL & MGMT  06/01/2023   IR US GUIDE BX ASP/DRAIN  05/16/2023    Prior to Admission medications   Medication Sig Start Date End Date Taking? Authorizing Provider  acetaminophen (TYLENOL) 500 MG tablet Take 500 mg by mouth as needed.    [provider]  amLODipine (NORVASC) 5 MG tablet Take 1 tablet (5 mg total) by mouth daily. 05/20/23    Meredeth Ide, MD  azaTHIOprine (IMURAN) 50 MG tablet Take 1 tablet (50 mg total) by mouth daily. 06/07/23   Cirigliano, Vito V, DO  dicyclomine (BENTYL) 20 MG tablet Take 1 tablet (20 mg total) by mouth 2 (two) times daily as needed for spasms (abdominal cramping). Patient not taking: Reported on 06/07/2023 05/09/23   Cirigliano, Verlin Dike, DO  ENBREL SURECLICK 50 MG/ML injection Inject 50 mg into the skin every Monday. 06/15/18   [provider]  losartan (COZAAR) 25 MG tablet Take 1 tablet (25 mg total) by mouth daily. 05/21/23   Meredeth Ide, MD  predniSONE (DELTASONE) 10 MG tablet Take 3 tablets (30 mg total) by mouth daily with breakfast for 7 days, THEN 2 tablets (20 mg total) daily with breakfast for 7 days, THEN 1.5 tablets (15 mg total) daily with breakfast for 7 days, THEN 1 tablet (10 mg total) daily with breakfast for 7 days, THEN 0.5 tablets (5 mg total) daily with breakfast for 7 days. 06/07/23 07/12/23  Cirigliano, Verlin Dike, DO    Current Outpatient Medications  Medication Sig Dispense Refill   acetaminophen (TYLENOL) 500 MG tablet Take 500 mg by mouth as needed.     amLODipine (NORVASC) 5 MG tablet Take 1 tablet (5 mg total) by mouth daily. 90 tablet 1   azaTHIOprine (IMURAN) 50 MG tablet Take 1 tablet (50 mg total) by mouth daily. 30 tablet 1   dicyclomine (BENTYL) 20 MG tablet Take 1 tablet (20 mg total) by mouth 2 (two) times daily as needed for spasms (abdominal cramping). (Patient not taking: Reported  on 06/07/2023) 90 tablet 1   ENBREL SURECLICK 50 MG/ML injection Inject 50 mg into the skin every Monday.     losartan (COZAAR) 25 MG tablet Take 1 tablet (25 mg total) by mouth daily. 30 tablet 3   predniSONE (DELTASONE) 10 MG tablet Take 3 tablets (30 mg total) by mouth daily with breakfast for 7 days, THEN 2 tablets (20 mg total) daily with breakfast for 7 days, THEN 1.5 tablets (15 mg total) daily with breakfast for 7 days, THEN 1 tablet (10 mg total) daily with breakfast for 7 days,  THEN 0.5 tablets (5 mg total) daily with breakfast for 7 days. 56 tablet 0   No current facility-administered medications for this visit.    Allergies as of 06/12/2023 - Review Complete 06/07/2023  Allergen Reaction Noted   Humira (2 pen) [adalimumab]  05/25/2023   Methotrexate derivatives  05/25/2023   Rinvoq [upadacitinib]  05/25/2023   Oxycodone Other (See Comments) 05/15/2023   Peanut-containing drug products Itching and Rash 07/17/2018    Family History  Problem Relation Age of Onset   Heart failure Mother    Aneurysm Father    Colon polyps Neg Hx    Colon cancer Neg Hx    Esophageal cancer Neg Hx    Stomach cancer Neg Hx    Rectal cancer Neg Hx     Social History   Socioeconomic History   Marital status: Divorced    Spouse name: Not on file   Number of children: Not on file   Years of education: Not on file   Highest education level: Not on file  Occupational History   Not on file  Tobacco Use   Smoking status: Some Days    Types: Cigars    Passive exposure: Current   Smokeless tobacco: Never  Vaping Use   Vaping status: Never Used  Substance and Sexual Activity   Alcohol use: Not Currently   Drug use: Not Currently    Frequency: 3.0 times per week   Sexual activity: Not on file  Other Topics Concern   Not on file  Social History Narrative   Not on file   Social Drivers of Health   Financial Resource Strain: Not on file  Food Insecurity: No Food Insecurity (05/15/2023)   Hunger Vital Sign    Worried About Running Out of Food in the Last Year: Never true    Ran Out of Food in the Last Year: Never true  Transportation Needs: No Transportation Needs (05/15/2023)   PRAPARE - Administrator, Civil Service (Medical): No    Lack of Transportation (Non-Medical): No  Physical Activity: Not on file  Stress: Not on file  Social Connections: Unknown (08/15/2021)   [facility]   Social Network    Social Network: Not  on file  Intimate Partner Violence: Not At Risk (05/15/2023)   Humiliation, Afraid, Rape, and Kick questionnaire    Fear of Current or Ex-Partner: No    Emotionally Abused: No    Physically Abused: No    Sexually Abused: No    Review of Systems:  All other review of systems negative except as mentioned in the HPI.  Physical Exam: Vital signs There were no vitals taken for this visit.  General:   Alert,  Well-developed, well-nourished, pleasant and cooperative in NAD Airway:  Mallampati  Lungs:  Clear throughout to auscultation.   Heart:  Regular rate and rhythm; no murmurs, clicks, rubs,  or  gallops. Abdomen:  Soft, nontender and nondistended. Normal bowel sounds.   Neuro/Psych:  Normal mood and affect. A and O x 3  Maren Beach, MD [facility]

## 2023-06-12 ENCOUNTER — Encounter: Payer: 59 | Admitting: Pediatrics

## 2023-06-13 ENCOUNTER — Other Ambulatory Visit (HOSPITAL_BASED_OUTPATIENT_CLINIC_OR_DEPARTMENT_OTHER): Payer: Self-pay

## 2023-06-14 ENCOUNTER — Ambulatory Visit (HOSPITAL_BASED_OUTPATIENT_CLINIC_OR_DEPARTMENT_OTHER): Payer: 59 | Admitting: Family Medicine

## 2023-07-13 ENCOUNTER — Other Ambulatory Visit: Payer: Self-pay

## 2023-07-13 ENCOUNTER — Encounter: Payer: Self-pay | Admitting: Gastroenterology

## 2023-07-13 ENCOUNTER — Ambulatory Visit: Payer: 59 | Admitting: Gastroenterology

## 2023-07-13 ENCOUNTER — Other Ambulatory Visit

## 2023-07-13 ENCOUNTER — Other Ambulatory Visit (HOSPITAL_BASED_OUTPATIENT_CLINIC_OR_DEPARTMENT_OTHER): Payer: Self-pay

## 2023-07-13 VITALS — BP 140/100 | HR 77 | Ht 70.0 in | Wt 166.0 lb

## 2023-07-13 DIAGNOSIS — N200 Calculus of kidney: Secondary | ICD-10-CM | POA: Diagnosis not present

## 2023-07-13 DIAGNOSIS — Z5181 Encounter for therapeutic drug level monitoring: Secondary | ICD-10-CM

## 2023-07-13 DIAGNOSIS — M069 Rheumatoid arthritis, unspecified: Secondary | ICD-10-CM

## 2023-07-13 DIAGNOSIS — K50919 Crohn's disease, unspecified, with unspecified complications: Secondary | ICD-10-CM

## 2023-07-13 DIAGNOSIS — L732 Hidradenitis suppurativa: Secondary | ICD-10-CM | POA: Diagnosis not present

## 2023-07-13 DIAGNOSIS — Z7960 Long term (current) use of unspecified immunomodulators and immunosuppressants: Secondary | ICD-10-CM

## 2023-07-13 DIAGNOSIS — D84821 Immunodeficiency due to drugs: Secondary | ICD-10-CM

## 2023-07-13 LAB — HEPATIC FUNCTION PANEL
ALT: 13 U/L (ref 0–53)
AST: 14 U/L (ref 0–37)
Albumin: 4.8 g/dL (ref 3.5–5.2)
Alkaline Phosphatase: 80 U/L (ref 39–117)
Bilirubin, Direct: 0.1 mg/dL (ref 0.0–0.3)
Total Bilirubin: 0.7 mg/dL (ref 0.2–1.2)
Total Protein: 7.7 g/dL (ref 6.0–8.3)

## 2023-07-13 LAB — CBC WITH DIFFERENTIAL/PLATELET
Basophils Absolute: 0 10*3/uL (ref 0.0–0.1)
Basophils Relative: 0.4 % (ref 0.0–3.0)
Eosinophils Absolute: 0 10*3/uL (ref 0.0–0.7)
Eosinophils Relative: 0.2 % (ref 0.0–5.0)
HCT: 43.5 % (ref 39.0–52.0)
Hemoglobin: 14.5 g/dL (ref 13.0–17.0)
Lymphocytes Relative: 5.7 % — ABNORMAL LOW (ref 12.0–46.0)
Lymphs Abs: 0.7 10*3/uL (ref 0.7–4.0)
MCHC: 33.4 g/dL (ref 30.0–36.0)
MCV: 93 fl (ref 78.0–100.0)
Monocytes Absolute: 0.1 10*3/uL (ref 0.1–1.0)
Monocytes Relative: 1 % — ABNORMAL LOW (ref 3.0–12.0)
Neutro Abs: 10.6 10*3/uL — ABNORMAL HIGH (ref 1.4–7.7)
Neutrophils Relative %: 92.7 % — ABNORMAL HIGH (ref 43.0–77.0)
Platelets: 346 10*3/uL (ref 150.0–400.0)
RBC: 4.68 Mil/uL (ref 4.22–5.81)
RDW: 16 % — ABNORMAL HIGH (ref 11.5–15.5)
WBC: 11.4 10*3/uL — ABNORMAL HIGH (ref 4.0–10.5)

## 2023-07-13 LAB — SEDIMENTATION RATE: Sed Rate: 31 mm/h — ABNORMAL HIGH (ref 0–20)

## 2023-07-13 LAB — C-REACTIVE PROTEIN: CRP: <1 mg/dL (ref 0.5–20.0)

## 2023-07-13 MED ORDER — NA SULFATE-K SULFATE-MG SULF 17.5-3.13-1.6 GM/177ML PO SOLN
1.0000 | Freq: Once | ORAL | 0 refills | Status: AC
  Filled 2023-07-13: qty 354, 1d supply, fill #0

## 2023-07-13 MED ORDER — AZATHIOPRINE 50 MG PO TABS
50.0000 mg | ORAL_TABLET | Freq: Every day | ORAL | 11 refills | Status: DC
  Filled 2023-07-13: qty 30, 30d supply, fill #0

## 2023-07-13 NOTE — Patient Instructions (Addendum)
 Your provider has requested that you go to the basement level for lab work before leaving today. Press "B" on the elevator. The lab is located at the first door on the left as you exit the elevator.   We have sent the following medications to your pharmacy for you to pick up at your convenience: Imuran and Suprep  We have placed a referral to Dermatology. Their office will contact you to schedule an appointment.   You have been scheduled for an endoscopy and colonoscopy. Please follow the written instructions given to you at your visit today.  If you use inhalers (even only as needed), please bring them with you on the day of your procedure.  DO NOT TAKE 7 DAYS PRIOR TO TEST- Trulicity (dulaglutide) Ozempic, Wegovy (semaglutide) Mounjaro (tirzepatide) Bydureon Bcise (exanatide extended release)  DO NOT TAKE 1 DAY PRIOR TO YOUR TEST Rybelsus (semaglutide) Adlyxin (lixisenatide) Victoza (liraglutide) Byetta (exanatide) ___________________________________________________________________________

## 2023-07-13 NOTE — Progress Notes (Signed)
 Chief Complaint:    Crohn's disease  GI History: 53 year old male with a history of rheumatoid arthritis, HTN, hidradenitis suppurativa, perirectal abscess  s/p I&D 07/2018, recently hospitalized 12/27-30 with abdominal pain, nausea/vomiting and diagnosed with terminal ileitis, SBO, and suspected newly diagnosed Crohn's Disease.   - 03/23/2023: ER evaluation for abdominal pain, nausea/vomiting. - 03/23/2023: CT A/P: Skip areas of bowel wall thickening including in the duodenum, proximal jejunum, terminal ileum, with most pronounced inflammation at the terminal ileum with regional peritoneal thickening considered reactive peritonitis.  Treated with Cipro/Flagyl x 7 days, dicyclomine - 03/30/2023: Represented to the ER with ongoing abdominal pain and nausea/vomiting along with diarrhea.  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  No abscess.  New focal wall thickening in the distal ileum with dilated small bowel loops with fecalization concerning for partial SBO.  Chronic sacroiliitis. - Negative C. difficile, GI PCR panel - ESR 59, CRP 0.7 - Normal/negative hepatitis B (needs HBV vaccine series).  Negative QuantiFERON gold.  Normal TPMT activity - General Surgery and GI consulted and was treated medically with IV Zosyn, IV Solu-Medrol with clinical improvement.  Was transition to Augmentin and prednisone with taper   -04/11/2023: Follow-up in the GI clinic.  Was doing well with prednisone, CRP normal and ESR downtrending.  Discussed starting Remicade or Stelara pending discussion with his Rheumatologist.  Was scheduled for expedited EGD/colonoscopy, but patient was a no-show - 05/16/2023: Hospital admission with RLQ abscess s/p drain placement by IR.  Cultures grew Klebsiella.  Treated with  Zosyn and Solu-Medrol and discharged with Augmentin along with prednisone 40 mg daily.  - 05/24/2023: CT: moderate mural thickening at the TI, resolution of RLQ abscess with pigtail catheter in place, punctate left-sided nephrolithiasis.  Drain was removed   For his history of RA, has been previously treated with Humira, then Rinvoq (not efficacious), then switched to Enbrel about 1.5 years ago.  Follows with Dr. Azucena Fallen at [facility].     Endoscopic History: - 01/10/2023: Colonoscopy (indication: Routine CRC screening; was otherwise asymptomatic): Normal, small terminal hemorrhoids  HPI:     [name] presenting to the Gastroenterology Clinic for follow-up.  Was last seen by me on 06/07/2023.  Was overall doing well at that time on prednisone 40 mg/day.  We started Imuran 50 mg/day with plan to uptitrate, and was scheduled for EGD/colonoscopy the following week, but he did not show.  Has subsequently started infliximab, with 2nd dose earlier this week. Feeling well overall. No issue with Imuran, but he ran out earlier this week and never called in for refill. Tapering his Prednisone, now at 5 mg/day.   Slowly advancing diet. Has formed stools. No abdominal pain, diarrhea.   No new labs or abdominal imaging since last appointment.  Review of systems:     No chest pain, no SOB, no fevers, no urinary sx   Past Medical History:  Diagnosis Date   Arthritis    RA   Crohn disease (HCC)    Hip fracture (HCC)    Hypertension    Rheumatoid arthritis (HCC)     Patient's surgical history, family medical history, social history, medications and allergies were all reviewed in Epic    Current Outpatient Medications  Medication Sig Dispense Refill   acetaminophen (TYLENOL) 500 MG tablet Take 500 mg  by mouth as needed.     amLODipine (NORVASC) 5 MG tablet Take 1 tablet (5 mg total) by mouth daily. 90 tablet 1   azaTHIOprine (IMURAN) 50 MG tablet Take 1 tablet  (50 mg total) by mouth daily. 30 tablet 1   dicyclomine (BENTYL) 20 MG tablet Take 1 tablet (20 mg total) by mouth 2 (two) times daily as needed for spasms (abdominal cramping). 90 tablet 1   ENBREL SURECLICK 50 MG/ML injection Inject 50 mg into the skin every Monday.     losartan (COZAAR) 25 MG tablet Take 1 tablet (25 mg total) by mouth daily. 30 tablet 3   No current facility-administered medications for this visit.    Physical Exam:     BP (!) 140/100   Pulse 77   Ht 5\' 10"  (1.778 m)   Wt 166 lb (75.3 kg)   BMI 23.82 kg/m   GENERAL:  Pleasant male in NAD PSYCH: : Cooperative, normal affect Musculoskeletal:  Normal muscle tone, normal strength NEURO: Alert and oriented x 3, no focal neurologic deficits   IMPRESSION and PLAN:    1) Crohn's Disease 53 year old male with recent hospital admission x2 for what seems to be newly diagnosed penetrating small bowel Crohn's Disease with ?perianal modifiers.  Good clinical response to steroids and antibiotics, and now started on Imuran and infliximab.  Has a history of RA, previously treated with Humira, then Rinvoq (not efficacious), and reports had been largely in remission for the last 1.5 years with Enbrel.   Interestingly, did have CT in 07/2018 which showed large complex right perianal abscess, moderate wall thickening of the terminal ileum, bilateral sacroiliitis.  Another CT in 11/2021 with wall thickening of the distal ileum with upstream short segments of luminal narrowing suspicious for small bowel Crohn's disease.  At the time of his screening colonoscopy in 01/2023, he was otherwise asymptomatic.  Colon was normal-appearing but TI not intubated.   - Again need to schedule EGD/colonoscopy.  Discussed the importance of endoscopic evaluation, and he would like to schedule - We again discussed pathophysiology of Crohn's Disease today along with medications.  Will continue with dual immunosuppressive therapy with Imuran/infliximab for  the time being with close monitoring - Imuran 50 mg/day. Refill placed today - Resume infliximab as scheduled - Check CBC, LAEs - Complete Prednisone taper - DEXA in 6 months - Stay with low residue diet - Check ESR, CRP now - Keep f/u with Rheumatology as scheduled - Referral to Dermatology for skin check  2) RA 3) Hidradenitis suppurativa - Has not been having any rheumatologic symptoms since initiation of steroids and now on infliximab - Keep follow-up in the Rheumatology Clinic as scheduled  4) Nephrolithiasis Nonobstructing left-sided stones noted on recent CT.  Per patient, was evaluated in the Urology Clinic and recommended conservative measurements  RTC in 6 months or sooner prn     The indications, risks, and benefits of EGD and colonoscopy were explained to the patient in detail. Risks include but are not limited to bleeding, perforation, adverse reaction to medications, and cardiopulmonary compromise. Sequelae include but are not limited to the possibility of surgery, hospitalization, and mortality. The patient verbalized understanding and wished to proceed. All questions answered, referred to scheduler and bowel prep ordered. Further recommendations pending results of the exam.   I spent 40 minutes of time, including in depth chart review, independent review of results as outlined above, communicating results with the patient directly, face-to-face time with the patient, coordinating care,  ordering studies and medications as appropriate, and documentation.   Shellia Cleverly ,DO, FACG 07/13/2023, 2:29 PM

## 2023-07-16 ENCOUNTER — Other Ambulatory Visit: Payer: Self-pay

## 2023-07-16 DIAGNOSIS — K50919 Crohn's disease, unspecified, with unspecified complications: Secondary | ICD-10-CM

## 2023-07-30 ENCOUNTER — Other Ambulatory Visit (HOSPITAL_BASED_OUTPATIENT_CLINIC_OR_DEPARTMENT_OTHER): Payer: Self-pay

## 2023-07-30 ENCOUNTER — Encounter (HOSPITAL_BASED_OUTPATIENT_CLINIC_OR_DEPARTMENT_OTHER): Payer: Self-pay | Admitting: Family Medicine

## 2023-07-30 ENCOUNTER — Ambulatory Visit (INDEPENDENT_AMBULATORY_CARE_PROVIDER_SITE_OTHER): Payer: 59 | Admitting: Family Medicine

## 2023-07-30 VITALS — BP 158/98 | HR 72 | Ht 69.0 in | Wt 172.7 lb

## 2023-07-30 DIAGNOSIS — I10 Essential (primary) hypertension: Secondary | ICD-10-CM

## 2023-07-30 MED ORDER — LOSARTAN POTASSIUM 50 MG PO TABS
50.0000 mg | ORAL_TABLET | Freq: Every day | ORAL | 1 refills | Status: AC
  Filled 2023-07-30 – 2023-08-13 (×2): qty 90, 90d supply, fill #0

## 2023-07-30 MED ORDER — LOSARTAN POTASSIUM 25 MG PO TABS
25.0000 mg | ORAL_TABLET | Freq: Every day | ORAL | 3 refills | Status: DC
  Filled 2023-07-30: qty 30, 30d supply, fill #0

## 2023-07-30 NOTE — Patient Instructions (Signed)
  Medication Instructions:  Your physician recommends that you continue on your current medications as directed. Please refer to the Current Medication list given to you today. --If you need a refill on any your medications before your next appointment, please call your pharmacy first. If no refills are authorized on file call the office.-- Lab Work: Your physician has recommended that you have lab work today: 2 weeks  If you have labs (blood work) drawn today and your tests are completely normal, you will receive your results via MyChart message OR a phone call from our staff.  Please ensure you check your voicemail in the event that you authorized detailed messages to be left on a delegated number. If you have any lab test that is abnormal or we need to change your treatment, we will call you to review the results.   Follow-Up: Your next appointment:   Your physician recommends that you schedule a follow-up appointment in: 4-6 week follow up  with Dr. de Peru  You will receive a text message or e-mail with a link to a survey about your care and experience with [place] today! We would greatly appreciate your feedback!   Thanks for letting us be apart of your health journey!!  Primary Care and Sports Medicine   Dr. Ceasar Mons Peru   We encourage you to activate your patient portal called "MyChart".  Sign up information is provided on this After Visit Summary.  MyChart is used to connect with patients for Virtual Visits (Telemedicine).  Patients are able to view lab/test results, encounter notes, upcoming appointments, etc.  Non-urgent messages can be sent to your provider as well. To learn more about what you can do with MyChart, please visit --  [email or web address].

## 2023-07-30 NOTE — Assessment & Plan Note (Signed)
 Initial blood pressure reading elevated in office today.  Patient reports that he has been taking his blood pressure medication - had stopped after recent hospitalization. Has been checking BP at home - reports readings at home 140s/80s.no issues with headaches, chest pain Recheck did show some improvement We discussed considerations, recommend adjusting current medication regimen, patient amenable. We will increase dose of losartan  today. Patient will return for BMP

## 2023-07-30 NOTE — Progress Notes (Signed)
    Procedures performed today:    None.  Independent interpretation of notes and tests performed by another provider:   None.  Brief History, Exam, Impression, and Recommendations:    BP (!) 158/98 (BP Location: Right Arm, Patient Position: Sitting, Cuff Size: Normal)   Pulse 72   Ht 5\' 9"  (1.753 m)   Wt 172 lb 11.2 oz (78.3 kg)   SpO2 99%   BMI 25.50 kg/m   Primary hypertension Assessment & Plan: Initial blood pressure reading elevated in office today.  Patient reports that he has been taking his blood pressure medication - had stopped after recent hospitalization. Has been checking BP at home - reports readings at home 140s/80s.no issues with headaches, chest pain Recheck did show some improvement We discussed considerations, recommend adjusting current medication regimen, patient amenable. We will increase dose of losartan  today. Patient will return for BMP  Orders: -     Basic metabolic panel with GFR; Future  Other orders -     Losartan  Potassium; Take 1 tablet (50 mg total) by mouth daily.  Dispense: 90 tablet; Refill: 1  Return in about 6 weeks (around 09/10/2023) for hypertension.   ___________________________________________ Shakeisha Horine de [place], MD, ABFM, CAQSM Primary Care and Sports Medicine [facility]

## 2023-08-09 ENCOUNTER — Other Ambulatory Visit (HOSPITAL_BASED_OUTPATIENT_CLINIC_OR_DEPARTMENT_OTHER): Payer: Self-pay

## 2023-08-13 ENCOUNTER — Other Ambulatory Visit (HOSPITAL_BASED_OUTPATIENT_CLINIC_OR_DEPARTMENT_OTHER): Payer: Self-pay

## 2023-08-13 ENCOUNTER — Ambulatory Visit (HOSPITAL_BASED_OUTPATIENT_CLINIC_OR_DEPARTMENT_OTHER)

## 2023-08-17 ENCOUNTER — Encounter: Payer: Self-pay | Admitting: Gastroenterology

## 2023-08-23 ENCOUNTER — Other Ambulatory Visit (HOSPITAL_BASED_OUTPATIENT_CLINIC_OR_DEPARTMENT_OTHER): Payer: Self-pay

## 2023-09-03 ENCOUNTER — Encounter: Payer: Self-pay | Admitting: Gastroenterology

## 2023-09-03 ENCOUNTER — Ambulatory Visit: Admitting: Gastroenterology

## 2023-09-03 VITALS — BP 196/118 | HR 76 | Temp 98.3°F | Ht 70.0 in | Wt 166.0 lb

## 2023-09-03 DIAGNOSIS — K50919 Crohn's disease, unspecified, with unspecified complications: Secondary | ICD-10-CM

## 2023-09-03 DIAGNOSIS — R1031 Right lower quadrant pain: Secondary | ICD-10-CM

## 2023-09-03 MED ORDER — SODIUM CHLORIDE 0.9 % IV SOLN: 500.0000 mL | INTRAVENOUS | Status: AC

## 2023-09-03 NOTE — Progress Notes (Signed)
 Patient arrived for EGD/colonoscopy.  Unfortunately, he abruptly stopped taking all medications, including antihypertensive medications, about 2 weeks ago.  On arrival, BP elevated with diastolic in the high 110s.  BP recheck was still consistently with diastolic in the 110s.  Staff explained elevated risk with medication noncompliance, particularly his antihypertensives.  Explained potential periprocedural and sedation related risks with uncontrolled hypertension.  Since he did complete bowel preparation, offered colonoscopy with light sedation, but patient declined in favor of resuming his outpatient medications and rescheduling EGD/colonoscopy to be done at the same time.  Patient encouraged to follow-up closely with his PCP regarding BP management.

## 2023-09-03 NOTE — Progress Notes (Signed)
 Pt's states no medical or surgical changes since previsit or office visit.   MD and CRNA made aware of BP. Pt rescheduled due to high BP

## 2023-09-17 ENCOUNTER — Telehealth: Payer: Self-pay | Admitting: Gastroenterology

## 2023-09-17 ENCOUNTER — Other Ambulatory Visit (HOSPITAL_BASED_OUTPATIENT_CLINIC_OR_DEPARTMENT_OTHER): Payer: Self-pay

## 2023-09-17 ENCOUNTER — Encounter: Payer: Self-pay | Admitting: Gastroenterology

## 2023-09-17 ENCOUNTER — Ambulatory Visit (AMBULATORY_SURGERY_CENTER)

## 2023-09-17 VITALS — Ht 69.0 in | Wt 165.0 lb

## 2023-09-17 DIAGNOSIS — K50119 Crohn's disease of large intestine with unspecified complications: Secondary | ICD-10-CM

## 2023-09-17 MED ORDER — NA SULFATE-K SULFATE-MG SULF 17.5-3.13-1.6 GM/177ML PO SOLN
1.0000 | Freq: Once | ORAL | 0 refills | Status: DC
  Filled 2023-09-17: qty 354, 1d supply, fill #0

## 2023-09-17 NOTE — Telephone Encounter (Signed)
 PT would like to speak with a nurse regarding a bulging area in his abdomen. Please advise.

## 2023-09-17 NOTE — Progress Notes (Signed)
 No egg or soy allergy known to patient  No issues known to pt with past sedation with any surgeries or procedures Patient denies ever being told they had issues or difficulty with intubation  No FH of Malignant Hyperthermia Pt is not on diet pills nor GLP-1 medications Pt is not on home 02  Pt is not on blood thinners  Pt denies issues with chronic constipation  No A fib or A flutter Have any cardiac testing pending--no Pt instructed to use Single[employer] or GoodRx for a price reduction on prep  Pt states that around the drain that was placed in 2/25 as seen in patient chart, he has developed a knot. Patient states he is not in pain but it feels odd. RN encouraged patient to talk with his PCP about it at an appointment he has this week, or if it worsens, to go to the ED. Ambulates independently

## 2023-09-18 ENCOUNTER — Encounter (HOSPITAL_COMMUNITY): Payer: Self-pay

## 2023-09-18 ENCOUNTER — Emergency Department (HOSPITAL_COMMUNITY)
Admission: EM | Admit: 2023-09-18 | Discharge: 2023-09-18 | Disposition: A | Discharge status: Home / Self Care | Attending: Emergency Medicine | Admitting: Emergency Medicine

## 2023-09-18 ENCOUNTER — Other Ambulatory Visit: Payer: Self-pay

## 2023-09-18 ENCOUNTER — Other Ambulatory Visit (HOSPITAL_BASED_OUTPATIENT_CLINIC_OR_DEPARTMENT_OTHER): Payer: Self-pay

## 2023-09-18 DIAGNOSIS — Z91010 Allergy to peanuts: Secondary | ICD-10-CM | POA: Diagnosis not present

## 2023-09-18 DIAGNOSIS — L0291 Cutaneous abscess, unspecified: Secondary | ICD-10-CM

## 2023-09-18 DIAGNOSIS — L02211 Cutaneous abscess of abdominal wall: Secondary | ICD-10-CM | POA: Insufficient documentation

## 2023-09-18 DIAGNOSIS — R1909 Other intra-abdominal and pelvic swelling, mass and lump: Secondary | ICD-10-CM | POA: Diagnosis present

## 2023-09-18 MED ORDER — LIDOCAINE-EPINEPHRINE (PF) 2 %-1:200000 IJ SOLN
10.0000 mL | Freq: Once | INTRAMUSCULAR | Status: AC
  Administered 2023-09-18: 10 mL
  Filled 2023-09-18: qty 20

## 2023-09-18 MED ORDER — DOXYCYCLINE HYCLATE 100 MG PO CAPS
100.0000 mg | ORAL_CAPSULE | Freq: Two times a day (BID) | ORAL | 0 refills | Status: DC
  Filled 2023-09-18: qty 20, 10d supply, fill #0

## 2023-09-18 NOTE — Discharge Instructions (Signed)
 You were seen for your abscess in the emergency department.   At home, please take the antibiotics to prevent infection. Take tylenol  and ibuprofen  for pain.    Check your MyChart online for the results of any tests that had not resulted by the time you left the emergency department.   Follow-up with your primary doctor in 2-3 days regarding your visit.    Return immediately to the emergency department if you experience any of the following: worsening pain, fevers, or any other concerning symptoms.    Thank you for visiting our Emergency Department. It was a pleasure taking care of you today.

## 2023-09-18 NOTE — ED Triage Notes (Signed)
 Pt arrive from home via POV with c/o R groin abscess with pain.

## 2023-09-18 NOTE — ED Provider Notes (Signed)
 [place] EMERGENCY DEPARTMENT AT [facility] Provider Note   CSN: [id number] Arrival date & time: 09/18/23  [id number]     Patient presents with: Abscess   Mark [name] is a 53 y.o. male.   53 year old male with history of Crohn's disease who presents emergency department with swelling in his right groin.  Patient reports that since Friday has been having painful swelling of his right groin.  Also has had generalized fatigue.  No fevers.  No penile discharge or dysuria.  No concern for STIs currently.       Prior to Admission medications   Medication Sig Start Date End Date Taking? Authorizing Provider  doxycycline  (VIBRAMYCIN ) 100 MG capsule Take 1 capsule (100 mg total) by mouth 2 (two) times daily. 09/18/23  Yes Ninetta Basket, MD  acetaminophen  (TYLENOL ) 500 MG tablet Take 500 mg by mouth as needed. Patient not taking: Reported on 09/17/2023    [provider]  amLODipine  (NORVASC ) 5 MG tablet Take 1 tablet (5 mg total) by mouth daily. 05/20/23   Ozell Blunt, MD  azaTHIOprine  (IMURAN ) 50 MG tablet Take 1 tablet (50 mg total) by mouth daily. Patient not taking: Reported on 09/17/2023 07/13/23   Cirigliano, Vito V, DO  dicyclomine  (BENTYL ) 20 MG tablet Take 1 tablet (20 mg total) by mouth 2 (two) times daily as needed for spasms (abdominal cramping). Patient not taking: Reported on 09/17/2023 05/09/23   Cirigliano, Vito V, DO  ENBREL SURECLICK 50 MG/ML injection Inject 50 mg into the skin every Monday. Patient not taking: Reported on 09/17/2023 06/15/18   [provider]  losartan  (COZAAR ) 50 MG tablet Take 1 tablet (50 mg total) by mouth daily. 07/30/23   de [place], Alonza Jansky, MD  Na Sulfate-K Sulfate-Mg Sulfate concentrate (SUPREP) 17.5-3.13-1.6 GM/177ML SOLN Take 1 kit (354 mLs total) by mouth once for 1 dose. 09/17/23 09/18/23  Cirigliano, Vito V, DO  REMICADE 100 MG injection Infuse 5mg /kg Intravenous at weeks 0,2,6 then every 8 weeks for 56 days 06/12/23    [provider]    Allergies: Peanut-containing drug products    Review of Systems  Updated Vital Signs BP (!) 185/99 (BP Location: Left Arm)   Pulse 69   Temp 97.8 F (36.6 C) (Oral)   Resp 18   Ht 5' 10 (1.778 m)   Wt 74.8 kg   SpO2 100%   BMI 23.68 kg/m   Physical Exam Constitutional:       (all labs ordered are listed, but only abnormal results are displayed) Labs Reviewed - No data to display  EKG: None  Radiology: No results found.   .Incision and Drainage  Date/Time: 09/18/2023 7:37 AM  Performed by: Ninetta Basket, MD Authorized by: Ninetta Basket, MD   Consent:    Consent obtained:  Verbal   Consent given by:  Patient Location:    Type:  Abscess   Location:  Trunk   Trunk location:  Abdomen Pre-procedure details:    Skin preparation:  Chlorhexidine  Sedation:    Sedation type:  None Anesthesia:    Anesthesia method:  Local infiltration   Local anesthetic:  Lidocaine  2% WITH epi Procedure type:    Complexity:  Simple Procedure details:    Ultrasound guidance: yes     Incision types:  Single straight   Wound management:  Probed and deloculated and irrigated with saline   Drainage:  Purulent   Drainage amount:  Copious   Wound treatment:  Wound left  open   Packing materials:  None Post-procedure details:    Procedure completion:  Tolerated well, no immediate complications   EMERGENCY DEPARTMENT US  SOFT TISSUE INTERPRETATION Study: Limited Soft Tissue Ultrasound  INDICATIONS: Pain and Soft tissue infection Multiple views of the body part were obtained in real-time with a multi-frequency linear probe  PERFORMED BY: Myself IMAGES ARCHIVED?: No SIDE:Right  BODY PART:Abdominal wall INTERPRETATION:  Abcess present that is not continuous with abdominal cavity.  No peristalsis noted.  No color inside the abscess when color flow is turned on.    Medications Ordered in the ED  lidocaine -EPINEPHrine  (XYLOCAINE  W/EPI) 2  %-1:200000 (PF) injection 10 mL (10 mLs Other Given 09/18/23 0716)                                    Medical Decision Making Risk Prescription drug management.   53 year old male with a history of Crohn's disease presents emergency department with swelling on his right groin and fatigue  Initial Ddx:  Abscess, cellulitis, STI, lymphadenopathy, hernia  MDM/Course:  Patient presents emergency department with what appears to be an abscess in his right lower quadrant.  Denies sexual transmitted infection risk factors or urinary symptoms.  Does have a tender fluctuant area on exam in that location.  Not reducible concerning for hernia.  The performing ultrasound does not appear to be bowel and does appear to be a contained abscess is approximately 2 cm.  It was drained with a single straight incision.  Does have some minimal overlying cellulitis so we will give him some antibiotics.  This patient presents to the ED for concern of complaints listed in HPI, this involves an extensive number of treatment options, and is a complaint that carries with it a high risk of complications and morbidity. Disposition including potential need for admission considered.   Dispo: DC Home. Return precautions discussed including, but not limited to, those listed in the AVS. Allowed pt time to ask questions which were answered fully prior to dc.  Records reviewed Outpatient Clinic Notes I have reviewed the patients home medications and made adjustments as needed  Portions of this note were generated with Dragon dictation software. Dictation errors may occur despite best attempts at proofreading.     Final diagnoses:  Abscess    ED Discharge Orders          Ordered    doxycycline  (VIBRAMYCIN ) 100 MG capsule  2 times daily        09/18/23 0728               Ninetta Basket, MD 09/18/23 [phone]

## 2023-09-19 ENCOUNTER — Ambulatory Visit (INDEPENDENT_AMBULATORY_CARE_PROVIDER_SITE_OTHER): Admitting: Family Medicine

## 2023-09-19 ENCOUNTER — Encounter (HOSPITAL_BASED_OUTPATIENT_CLINIC_OR_DEPARTMENT_OTHER): Payer: Self-pay | Admitting: Family Medicine

## 2023-09-19 VITALS — BP 178/111 | HR 80 | Ht 69.0 in | Wt 161.9 lb

## 2023-09-19 DIAGNOSIS — I10 Essential (primary) hypertension: Secondary | ICD-10-CM

## 2023-09-19 DIAGNOSIS — L0291 Cutaneous abscess, unspecified: Secondary | ICD-10-CM | POA: Insufficient documentation

## 2023-09-19 NOTE — Assessment & Plan Note (Signed)
 Patient was seen at emergency department yesterday for abscess.  Did have I&D and was prescribed doxycycline .  Has not had any issues with fever or chills.  Does still have some mild redness around abscess.  Has been tolerating antibiotic without issue. On exam, incised abscess located over right lower abdomen.  No active drainage.  Wound is still open, no current drainage.  Some induration noted around wound edges. Recommend continuing with antibiotic therapy as prescribed by emergency department.  Discussed precautions.  Also discussed avoidance of sun exposure due to increased photosensitivity with use of doxycycline 

## 2023-09-19 NOTE — Patient Instructions (Signed)
  Medication Instructions:  Your physician recommends that you continue on your current medications as directed. Please refer to the Current Medication list given to you today. --If you need a refill on any your medications before your next appointment, please call your pharmacy first. If no refills are authorized on file call the office.--   Follow-Up: Your next appointment:   Your physician recommends that you schedule a follow-up appointment in: 6-8 week follow up  with Dr. de Peru  You will receive a text message or e-mail with a link to a survey about your care and experience with [place] today! We would greatly appreciate your feedback!   Thanks for letting us be apart of your health journey!!  Primary Care and Sports Medicine   Dr. Ceasar Mons [place]   We encourage you to activate your patient portal called "MyChart".  Sign up information is provided on this After Visit Summary.  MyChart is used to connect with patients for Virtual Visits (Telemedicine).  Patients are able to view lab/test results, encounter notes, upcoming appointments, etc.  Non-urgent messages can be sent to your provider as well. To learn more about what you can do with MyChart, please visit --  [email or web address].

## 2023-09-19 NOTE — Assessment & Plan Note (Signed)
 Blood pressure elevated in office today.  Patient indicates that he has only been taking losartan .  Has questions about whether he would need to be taking amlodipine  as well.  No current issues with chest pain or headaches. Discussed recommendation to continue with both antihypertensives, amlodipine  and losartan .  He does have both medications available still.  Recommended intermittent monitoring of blood pressure at home, DASH diet.  Will plan to follow-up in 6 to 8 weeks to monitor blood pressure

## 2023-09-19 NOTE — Progress Notes (Signed)
    Procedures performed today:    None.  Independent interpretation of notes and tests performed by another provider:   None.  Brief History, Exam, Impression, and Recommendations:    BP (!) 178/111 (BP Location: Right Arm, Patient Position: Sitting, Cuff Size: Normal)   Pulse 80   Ht 5' 9 (1.753 m)   Wt 161 lb 14.4 oz (73.4 kg)   SpO2 99%   BMI 23.91 kg/m   Primary hypertension Assessment & Plan: Blood pressure elevated in office today.  Patient indicates that he has only been taking losartan .  Has questions about whether he would need to be taking amlodipine  as well.  No current issues with chest pain or headaches. Discussed recommendation to continue with both antihypertensives, amlodipine  and losartan .  He does have both medications available still.  Recommended intermittent monitoring of blood pressure at home, DASH diet.  Will plan to follow-up in 6 to 8 weeks to monitor blood pressure   Abscess Assessment & Plan: Patient was seen at emergency department yesterday for abscess.  Did have I&D and was prescribed doxycycline .  Has not had any issues with fever or chills.  Does still have some mild redness around abscess.  Has been tolerating antibiotic without issue. On exam, incised abscess located over right lower abdomen.  No active drainage.  Wound is still open, no current drainage.  Some induration noted around wound edges. Recommend continuing with antibiotic therapy as prescribed by emergency department.  Discussed precautions.  Also discussed avoidance of sun exposure due to increased photosensitivity with use of doxycycline    Return in about 6 weeks (around 10/31/2023) for hypertension.   ___________________________________________ Alisea Matte de [place], MD, ABFM, CAQSM Primary Care and Sports Medicine [facility]

## 2023-09-24 ENCOUNTER — Encounter: Payer: Self-pay | Admitting: Gastroenterology

## 2023-09-24 ENCOUNTER — Ambulatory Visit: Admitting: Gastroenterology

## 2023-09-24 VITALS — BP 170/115 | HR 90 | Temp 98.2°F | Ht 69.0 in | Wt 165.0 lb

## 2023-09-24 DIAGNOSIS — K50119 Crohn's disease of large intestine with unspecified complications: Secondary | ICD-10-CM

## 2023-09-24 MED ORDER — SODIUM CHLORIDE 0.9 % IV SOLN: 500.0000 mL | Freq: Once | INTRAVENOUS | Status: AC

## 2023-09-24 NOTE — Progress Notes (Signed)
 Pt reports going to ED on 09/18/2023, for an abscess and reports it was drained, was given an antibiotic as well since last previsit on 09/17/2023.

## 2023-09-24 NOTE — Progress Notes (Signed)
 BP elevated 184/118. Recheck 170/115. MD and CRNA discussed with pt. Procedure cancelled d/t elevated BP. Instructed pt to f/u with PCP for BP to be better controlled. Pt to reschedule once BP is improved. Procedure cancelled on 09/03/23 d/t elevated BP.

## 2023-09-24 NOTE — Progress Notes (Signed)
 GASTROENTEROLOGY PROCEDURE H&P NOTE   Primary Care Physician: de [place], Quintin PARAS, MD    Reason for Procedure:  Crohn's disease  Plan:    EGD, colonoscopy (cancelled)   Unfortunately, he continues to have uncontrolled hypertension which endoscopic procedures and sedation high risk.  We discussed those elevated risk today, and he would like to postpone procedures until BP better controlled.  Plan for the following:  - I will send a message to his PCP today to facilitate expedited follow-up - Referral to Cardiology and Nephrology for assistance in uncontrolled hypertension - Will wait to reschedule procedures until BP under better control - Needs refill of 50 mg.  Will place that today     HPI: Mark [name] is a 53 y.o. 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  Currently on infliximab and Imuran .  Completed prednisone  taper in April. Has a history of RA and hidradenitis suppurativa, previously treated with Humira, then Rinvoq (not efficacious), and reports had been largely in remission for the last 1.5 years with Enbrel.  No longer on Enbrel since starting infliximab and Imuran .  Was seen in the ER on 09/18/2023 with RLQ abscess requiring I&D and discharged home with doxycycline .  Was seen by his PCP the following day.  BP that day was 178/111, but appears he was only taking the losartan .  Recommended restarting amlodipine  as well with home BP monitoring and follow-up in 6-8 weeks.  Since then, has been taking his antihypertensives as prescribed.  Initial BP 170/115.  He reports he has not nervous or anxious.  BP at home this morning was 177/110.  He states BP typically in that range on home monitor.  Was given time to rest, but repeat BP 214/105.   Past Medical History:  Diagnosis Date   Arthritis    RA   Crohn disease (HCC)    Hip fracture (HCC)    Hypertension    Rheumatoid  arthritis (HCC)     Past Surgical History:  Procedure Laterality Date   HYDRADENITIS EXCISION Bilateral 09/18/2018   Procedure: EXCISION BILATERAL AXILLARY HIDRADENITIS;  Surgeon: Vanderbilt Ned, MD;  Location:  SURGERY CENTER;  Service: General;  Laterality: Bilateral;   INCISION AND DRAINAGE PERIRECTAL ABSCESS N/A 07/17/2018   Procedure: IRRIGATION AND DEBRIDEMENT PERIRECTAL ABSCESS;  Surgeon: Rubin Calamity, MD;  Location: [facility] OR;  Service: General;  Laterality: N/A;   IR RADIOLOGIST EVAL & MGMT  06/01/2023   IR US  GUIDE BX ASP/DRAIN  05/16/2023    Prior to Admission medications   Medication Sig Start Date End Date Taking? Authorizing Provider  acetaminophen  (TYLENOL ) 500 MG tablet Take 500 mg by mouth as needed.   Yes [provider]  amLODipine  (NORVASC ) 5 MG tablet Take 1 tablet (5 mg total) by mouth daily. 05/20/23  Yes Drusilla Sabas RAMAN, MD  doxycycline  (VIBRAMYCIN ) 100 MG capsule Take 1 capsule (100 mg total) by mouth 2 (two) times daily. 09/18/23  Yes Yolande Lamar BROCKS, MD  losartan  (COZAAR ) 50 MG tablet Take 1 tablet (50 mg total) by mouth daily. 07/30/23  Yes de [place], Raymond J, MD  azaTHIOprine  (IMURAN ) 50 MG tablet Take 1 tablet (50 mg total) by mouth daily. Patient not taking: Reported on 09/19/2023 07/13/23   Eulon Allnutt V, DO  dicyclomine  (BENTYL ) 20 MG tablet Take 1 tablet (20 mg total) by mouth 2 (two) times daily as needed for spasms (abdominal cramping). Patient not taking: Reported  on 09/24/2023 05/09/23   Vaness Jelinski V, DO  REMICADE 100 MG injection Infuse 5mg /kg Intravenous at weeks 0,2,6 then every 8 weeks for 56 days 06/12/23   [provider]    Current Outpatient Medications  Medication Sig Dispense Refill   acetaminophen  (TYLENOL ) 500 MG tablet Take 500 mg by mouth as needed.     amLODipine  (NORVASC ) 5 MG tablet Take 1 tablet (5 mg total) by mouth daily. 90 tablet 1   doxycycline  (VIBRAMYCIN ) 100 MG capsule Take 1 capsule (100 mg  total) by mouth 2 (two) times daily. 20 capsule 0   losartan  (COZAAR ) 50 MG tablet Take 1 tablet (50 mg total) by mouth daily. 90 tablet 1   azaTHIOprine  (IMURAN ) 50 MG tablet Take 1 tablet (50 mg total) by mouth daily. (Patient not taking: Reported on 09/19/2023) 30 tablet 11   dicyclomine  (BENTYL ) 20 MG tablet Take 1 tablet (20 mg total) by mouth 2 (two) times daily as needed for spasms (abdominal cramping). (Patient not taking: Reported on 09/24/2023) 90 tablet 1   REMICADE 100 MG injection Infuse 5mg /kg Intravenous at weeks 0,2,6 then every 8 weeks for 56 days     Current Facility-Administered Medications  Medication Dose Route Frequency Provider Last Rate Last Admin   0.9 %  sodium chloride  infusion  500 mL Intravenous Once Demerius Podolak V, DO        Allergies as of 09/24/2023 - Review Complete 09/24/2023  Allergen Reaction Noted   Peanut-containing drug products Itching and Rash 07/17/2018    Family History  Problem Relation Age of Onset   Heart failure Mother    Aneurysm Father    Colon polyps Neg Hx    Colon cancer Neg Hx    Esophageal cancer Neg Hx    Stomach cancer Neg Hx    Rectal cancer Neg Hx     Social History   Socioeconomic History   Marital status: Divorced    Spouse name: Not on file   Number of children: Not on file   Years of education: Not on file   Highest education level: Not on file  Occupational History   Not on file  Tobacco Use   Smoking status: Some Days    Types: Cigars    Passive exposure: Current   Smokeless tobacco: Never   Tobacco comments:    Patient states he smokes a cigar once in a while.  Vaping Use   Vaping status: Never Used  Substance and Sexual Activity   Alcohol use: Not Currently   Drug use: Not Currently    Frequency: 3.0 times per week   Sexual activity: Not on file  Other Topics Concern   Not on file  Social History Narrative   Not on file   Social Drivers of Health   Financial Resource Strain: Not on file   Food Insecurity: No Food Insecurity (05/15/2023)   Hunger Vital Sign    Worried About Running Out of Food in the Last Year: Never true    Ran Out of Food in the Last Year: Never true  Transportation Needs: No Transportation Needs (05/15/2023)   PRAPARE - Administrator, Civil Service (Medical): No    Lack of Transportation (Non-Medical): No  Physical Activity: Not on file  Stress: Not on file  Social Connections: Unknown (08/15/2021)   Received from [facility]   Social Network    Social Network: Not on file  Intimate Partner Violence: Not At Risk (05/15/2023)  Humiliation, Afraid, Rape, and Kick questionnaire    Fear of Current or Ex-Partner: No    Emotionally Abused: No    Physically Abused: No    Sexually Abused: No    Physical Exam: Vital signs in last 24 hours: @BP  (!) 170/115   Pulse 90   Temp 98.2 F (36.8 C) (Temporal)   Ht 5' 9 (1.753 m)   Wt 165 lb (74.8 kg)   SpO2 99%   BMI 24.37 kg/m  GEN: NAD EYE: Sclerae anicteric ENT: MMM CV: Non-tachycardic Pulm: CTA b/l GI: Soft, NT/ND NEURO:  Alert & Oriented x 3   Sandor Flatter, DO [facility]   09/24/2023 1:06 PM

## 2023-09-25 ENCOUNTER — Other Ambulatory Visit (HOSPITAL_BASED_OUTPATIENT_CLINIC_OR_DEPARTMENT_OTHER): Payer: Self-pay

## 2023-09-25 ENCOUNTER — Telehealth: Payer: Self-pay

## 2023-09-25 DIAGNOSIS — I10 Essential (primary) hypertension: Secondary | ICD-10-CM

## 2023-09-25 MED ORDER — AZATHIOPRINE 50 MG PO TABS
50.0000 mg | ORAL_TABLET | Freq: Every day | ORAL | 3 refills | Status: DC
  Filled 2023-09-25 – 2023-10-17 (×2): qty 90, 90d supply, fill #0

## 2023-09-25 NOTE — Telephone Encounter (Signed)
-----   Message from Sandor LULLA Flatter sent at 09/24/2023  1:44 PM EDT ----- Patient was scheduled for EGD/colonoscopy today.  Unfortunately his BP is again significantly elevated and procedures need to be canceled.  Can you please arrange for him to follow:  -Expedited follow-up with his PCP -referral to Nephrology and Cardiology for uncontrolled hypertension - Please place a referral for his Imuran .  To continue current dose with 90-day supply and RF 3  Will wait to reschedule procedures until HTN controlled.   Thanks!

## 2023-09-25 NOTE — Telephone Encounter (Signed)
 Patient advised that he has been schduled to follow up with Dr everitt Peru on 10-04-23 at 10:30am.  Patient advised that he has also been referred to Nephrology and Cardiology.  Patient instructed that if he does not hear from those offices in 1 to 2 weeks with an appointment to please contact our office to make us  aware.  Rx for Imuran  sent to [facility] as suggested by Dr San.   Patient agreed to plan and verbalized understanding.  No further questions or concerns.

## 2023-10-04 ENCOUNTER — Inpatient Hospital Stay (HOSPITAL_BASED_OUTPATIENT_CLINIC_OR_DEPARTMENT_OTHER): Admitting: Family Medicine

## 2023-10-08 ENCOUNTER — Other Ambulatory Visit (HOSPITAL_BASED_OUTPATIENT_CLINIC_OR_DEPARTMENT_OTHER): Payer: Self-pay

## 2023-10-17 ENCOUNTER — Other Ambulatory Visit (HOSPITAL_BASED_OUTPATIENT_CLINIC_OR_DEPARTMENT_OTHER): Payer: Self-pay

## 2023-11-12 ENCOUNTER — Ambulatory Visit (INDEPENDENT_AMBULATORY_CARE_PROVIDER_SITE_OTHER): Admitting: Family Medicine

## 2023-11-12 ENCOUNTER — Encounter (HOSPITAL_BASED_OUTPATIENT_CLINIC_OR_DEPARTMENT_OTHER): Payer: Self-pay | Admitting: Family Medicine

## 2023-11-12 VITALS — BP 217/118 | HR 69 | Temp 98.0°F | Resp 18 | Ht 69.0 in | Wt 164.0 lb

## 2023-11-12 DIAGNOSIS — R5383 Other fatigue: Secondary | ICD-10-CM | POA: Diagnosis not present

## 2023-11-12 DIAGNOSIS — I10 Essential (primary) hypertension: Secondary | ICD-10-CM

## 2023-11-12 NOTE — Assessment & Plan Note (Addendum)
 Blood pressure elevated in office today.  Patient indicates that he has only been taking losartan  despite specific counseling to take both medications at last office visit. And he has not taken any medications this morning.  Has not been checking BP at home. Had GI procedure scheduled since our last appt, but this had to be cancelled due to BP being elevated. No current issues with chest pain or headaches Long discussion again today about blood pressure medications. He was referred to cardiology and nephrology by GI provider. Discussed recommendation to continue with both antihypertensives, amlodipine  and losartan .  He does have both medications available still.  Recommended intermittent monitoring of blood pressure at home, DASH diet.  Will plan to follow-up in 1-2 weeks to monitor blood pressure

## 2023-11-12 NOTE — Progress Notes (Signed)
    Procedures performed today:    None.  Independent interpretation of notes and tests performed by another provider:   None.  Brief History, Exam, Impression, and Recommendations:    BP (!) 217/118   Pulse 69   Temp 98 F (36.7 C) (Oral)   Resp 18   Ht 5' 9 (1.753 m)   Wt 164 lb (74.4 kg)   SpO2 99%   BMI 24.22 kg/m   Primary hypertension Assessment & Plan: Blood pressure elevated in office today.  Patient indicates that he has only been taking losartan  despite specific counseling to take both medications at last office visit. And he has not taken any medications this morning.  Has not been checking BP at home. Had GI procedure scheduled since our last appt, but this had to be cancelled due to BP being elevated. No current issues with chest pain or headaches Long discussion again today about blood pressure medications. He was referred to cardiology and nephrology by GI provider. Discussed recommendation to continue with both antihypertensives, amlodipine  and losartan .  He does have both medications available still.  Recommended intermittent monitoring of blood pressure at home, DASH diet.  Will plan to follow-up in 1-2 weeks to monitor blood pressure  Orders: -     CBC with Differential/Platelet -     Comprehensive metabolic panel with GFR -     TSH Rfx on Abnormal to Free T4 -     Hemoglobin A1c  Fatigue, unspecified type Assessment & Plan: Has had more recent issues with fatigue, feels like he isn't eating well, generally not himself. This has been more recent. Discussed potential role of uncontrolled HTN, discussed other possible etiologies. Can proceed with labs as below.  Orders: -     CBC with Differential/Platelet -     Comprehensive metabolic panel with GFR -     TSH Rfx on Abnormal to Free T4 -     Hemoglobin A1c  Return in about 1 week (around 11/19/2023).   ___________________________________________ Siedah Sedor de [place], MD, ABFM, CAQSM Primary Care and  Sports Medicine [facility]

## 2023-11-12 NOTE — Assessment & Plan Note (Signed)
 Has had more recent issues with fatigue, feels like he isn't eating well, generally not himself. This has been more recent. Discussed potential role of uncontrolled HTN, discussed other possible etiologies. Can proceed with labs as below.

## 2023-11-13 ENCOUNTER — Ambulatory Visit (HOSPITAL_BASED_OUTPATIENT_CLINIC_OR_DEPARTMENT_OTHER): Admitting: Family Medicine

## 2023-11-13 LAB — HEMOGLOBIN A1C
Est. average glucose Bld gHb Est-mCnc: 94 mg/dL
Hgb A1c MFr Bld: 4.9 % (ref 4.8–5.6)

## 2023-11-13 LAB — COMPREHENSIVE METABOLIC PANEL WITH GFR
ALT: 12 IU/L (ref 0–44)
AST: 18 IU/L (ref 0–40)
Albumin: 4.6 g/dL (ref 3.8–4.9)
Alkaline Phosphatase: 121 IU/L (ref 44–121)
BUN/Creatinine Ratio: 13 (ref 9–20)
BUN: 14 mg/dL (ref 6–24)
Bilirubin Total: 0.7 mg/dL (ref 0.0–1.2)
CO2: 21 mmol/L (ref 20–29)
Calcium: 9.8 mg/dL (ref 8.7–10.2)
Chloride: 102 mmol/L (ref 96–106)
Creatinine, Ser: 1.05 mg/dL (ref 0.76–1.27)
Globulin, Total: 2.8 g/dL (ref 1.5–4.5)
Glucose: 72 mg/dL (ref 70–99)
Potassium: 3.6 mmol/L (ref 3.5–5.2)
Sodium: 139 mmol/L (ref 134–144)
Total Protein: 7.4 g/dL (ref 6.0–8.5)
eGFR: 84 mL/min/1.73 (ref >59)

## 2023-11-13 LAB — CBC WITH DIFFERENTIAL/PLATELET
Basophils Absolute: 0.1 x10E3/uL (ref 0.0–0.2)
Basos: 1 %
EOS (ABSOLUTE): 0.2 x10E3/uL (ref 0.0–0.4)
Eos: 2 %
Hematocrit: 43.1 % (ref 37.5–51.0)
Hemoglobin: 14.4 g/dL (ref 13.0–17.7)
Immature Grans (Abs): 0 x10E3/uL (ref 0.0–0.1)
Immature Granulocytes: 0 %
Lymphocytes Absolute: 2.4 x10E3/uL (ref 0.7–3.1)
Lymphs: 22 %
MCH: 30.7 pg (ref 26.6–33.0)
MCHC: 33.4 g/dL (ref 31.5–35.7)
MCV: 92 fL (ref 79–97)
Monocytes Absolute: 0.6 x10E3/uL (ref 0.1–0.9)
Monocytes: 5 %
Neutrophils Absolute: 7.7 x10E3/uL — ABNORMAL HIGH (ref 1.4–7.0)
Neutrophils: 70 %
Platelets: 340 x10E3/uL (ref 150–450)
RBC: 4.69 x10E6/uL (ref 4.14–5.80)
RDW: 13.3 % (ref 11.6–15.4)
WBC: 11 x10E3/uL — ABNORMAL HIGH (ref 3.4–10.8)

## 2023-11-13 LAB — TSH RFX ON ABNORMAL TO FREE T4: TSH: 0.768 u[IU]/mL (ref 0.450–4.500)

## 2023-11-15 ENCOUNTER — Ambulatory Visit (HOSPITAL_BASED_OUTPATIENT_CLINIC_OR_DEPARTMENT_OTHER): Payer: Self-pay | Admitting: Family Medicine

## 2023-11-19 ENCOUNTER — Ambulatory Visit (HOSPITAL_BASED_OUTPATIENT_CLINIC_OR_DEPARTMENT_OTHER): Admitting: Family Medicine

## 2023-11-23 ENCOUNTER — Emergency Department (HOSPITAL_COMMUNITY)
Admission: EM | Admit: 2023-11-23 | Discharge: 2023-11-23 | Disposition: A | Discharge status: Home / Self Care | Attending: Emergency Medicine | Admitting: Emergency Medicine

## 2023-11-23 ENCOUNTER — Other Ambulatory Visit (HOSPITAL_BASED_OUTPATIENT_CLINIC_OR_DEPARTMENT_OTHER): Payer: Self-pay

## 2023-11-23 ENCOUNTER — Emergency Department (HOSPITAL_COMMUNITY)

## 2023-11-23 ENCOUNTER — Other Ambulatory Visit: Payer: Self-pay

## 2023-11-23 DIAGNOSIS — L02211 Cutaneous abscess of abdominal wall: Secondary | ICD-10-CM | POA: Diagnosis not present

## 2023-11-23 DIAGNOSIS — X58XXXA Exposure to other specified factors, initial encounter: Secondary | ICD-10-CM | POA: Diagnosis not present

## 2023-11-23 DIAGNOSIS — L0291 Cutaneous abscess, unspecified: Secondary | ICD-10-CM

## 2023-11-23 DIAGNOSIS — S90122A Contusion of left lesser toe(s) without damage to nail, initial encounter: Secondary | ICD-10-CM | POA: Diagnosis not present

## 2023-11-23 DIAGNOSIS — Z91010 Allergy to peanuts: Secondary | ICD-10-CM | POA: Diagnosis not present

## 2023-11-23 DIAGNOSIS — S99922A Unspecified injury of left foot, initial encounter: Secondary | ICD-10-CM | POA: Diagnosis present

## 2023-11-23 MED ORDER — AMOXICILLIN-POT CLAVULANATE 875-125 MG PO TABS
1.0000 | ORAL_TABLET | Freq: Once | ORAL | Status: AC
Start: 2023-11-23 — End: 2023-11-23
  Administered 2023-11-23: 1 via ORAL
  Filled 2023-11-23: qty 1

## 2023-11-23 MED ORDER — LIDOCAINE HCL 2 % IJ SOLN
10.0000 mL | Freq: Once | INTRAMUSCULAR | Status: AC
  Administered 2023-11-23: 200 mg
  Filled 2023-11-23: qty 20

## 2023-11-23 MED ORDER — AMOXICILLIN-POT CLAVULANATE 875-125 MG PO TABS
1.0000 | ORAL_TABLET | Freq: Two times a day (BID) | ORAL | 0 refills | Status: AC
  Filled 2023-11-23: qty 14, 7d supply, fill #0

## 2023-11-23 NOTE — ED Provider Notes (Signed)
 [place] EMERGENCY DEPARTMENT AT [facility] Provider Note   CSN: [id number] Arrival date & time: 11/23/23  1236     Patient presents with: Abscess   Mark [name] is a 53 y.o. male.   Patient complains of an abscess on the right side of his abdomen.  Patient states he has a history of hidradenitis suppurativa.  Patient reports he has had increased swelling to the area.  Patient denies fever or chills he is on doxycycline .  Patient reports that he has a past medical history of rheumatoid arthritis and is on Remicade.  Patient states that he also hit his left third toe and is concerned that he could have a toe fracture.  The history is provided by a relative. No language interpreter was used.  Abscess Location:  Torso Torso abscess location:  Abd RLQ Size:  2 cm Abscess quality: redness and warmth   Progression:  Worsening Chronicity:  New Relieved by:  Nothing Worsened by:  Nothing Ineffective treatments:  None tried Associated symptoms: no nausea and no vomiting        Prior to Admission medications   Medication Sig Start Date End Date Taking? Authorizing Provider  amoxicillin -clavulanate (AUGMENTIN ) 875-125 MG tablet Take 1 tablet by mouth 2 (two) times daily. 11/23/23  Yes Kimala Horne K, PA-C  acetaminophen  (TYLENOL ) 500 MG tablet Take 500 mg by mouth as needed.    [provider]  losartan  (COZAAR ) 50 MG tablet Take 1 tablet (50 mg total) by mouth daily. 07/30/23   de [place], Quintin PARAS, MD  REMICADE 100 MG injection Infuse 5mg /kg Intravenous at weeks 0,2,6 then every 8 weeks for 56 days 06/12/23   [provider]    Allergies: Peanut-containing drug products    Review of Systems  Gastrointestinal:  Negative for nausea and vomiting.  All other systems reviewed and are negative.   Updated Vital Signs BP (!) 168/98   Pulse 90   Temp 98 F (36.7 C)   Resp 14   Ht 5' 9 (1.753 m)   Wt 74.8 kg   SpO2 100%   BMI 24.37 kg/m    Physical Exam Vitals and nursing note reviewed.  Constitutional:      Appearance: He is well-developed.  HENT:     Head: Normocephalic.     Mouth/Throat:     Mouth: Mucous membranes are moist.  Cardiovascular:     Rate and Rhythm: Normal rate.  Pulmonary:     Effort: Pulmonary effort is normal.  Abdominal:     General: There is no distension.  Musculoskeletal:        General: Normal range of motion.     Cervical back: Normal range of motion.     Comments: Bruised swollen 3rd middle toe,  pain to palpation   Skin:    General: Skin is warm.  Neurological:     General: No focal deficit present.     Mental Status: He is alert and oriented to person, place, and time.  Psychiatric:        Mood and Affect: Mood normal.     (all labs ordered are listed, but only abnormal results are displayed) Labs Reviewed - No data to display  EKG: None  Radiology: DG Foot Complete Left Result Date: 11/23/2023 CLINICAL DATA:  pain EXAM: LEFT FOOT - COMPLETE 3+ VIEW COMPARISON:  January 15, 2014 FINDINGS: Similar hallux valgus deformity.No acute fracture or dislocation. There is no evidence of arthropathy or other focal bone abnormality.  Soft tissues are unremarkable. No radiopaque foreign body. IMPRESSION: No acute fracture or dislocation. Electronically Signed   By: Rogelia Myers M.D.   On: 11/23/2023 17:45     .Incision and Drainage  Date/Time: 11/23/2023 9:41 PM  Performed by: Flint Sonny POUR, PA-C Authorized by: Flint Sonny POUR, PA-C   Consent:    Consent obtained:  Verbal   Consent given by:  Patient   Risks, benefits, and alternatives were discussed: yes     Risks discussed:  Incomplete drainage   Alternatives discussed:  Delayed treatment Universal protocol:    Procedure explained and questions answered to patient or proxy's satisfaction: yes     Immediately prior to procedure, a time out was called: yes     Patient identity confirmed:  Verbally with patient Location:     Type:  Abscess   Size:  2 cm   Location:  Trunk   Trunk location:  Abdomen Pre-procedure details:    Skin preparation:  Povidone-iodine Sedation:    Sedation type:  None Anesthesia:    Anesthesia method:  Local infiltration   Local anesthetic:  Lidocaine  1% w/o epi Procedure type:    Complexity:  Simple Procedure details:    Ultrasound guidance: no     Needle aspiration: no     Incision types:  Single straight   Wound treatment:  Wound left open Post-procedure details:    Procedure completion:  Tolerated    Medications Ordered in the ED  lidocaine  (XYLOCAINE ) 2 % (with pres) injection 200 mg (200 mg Infiltration Given by Other 11/23/23 1704)  amoxicillin -clavulanate (AUGMENTIN ) 875-125 MG per tablet 1 tablet (1 tablet Oral Given 11/23/23 1835)                                    Medical Decision Making Patient complains of an abscess to his right lower abdomen.  Patient also complains of an injury to his left third toe  Amount and/or Complexity of Data Reviewed Radiology: ordered and independent interpretation performed. Decision-making details documented in ED Course.    Details: X-ray toe left foot no fracture  Risk Prescription drug management. Risk Details: I&D performed with large amount of drainage.  Patient is given a prescription for Augmentin .  He is advised to follow-up with his primary care doctor for recheck.        Final diagnoses:  Abscess  Contusion of third toe of left foot, initial encounter    ED Discharge Orders          Ordered    amoxicillin -clavulanate (AUGMENTIN ) 875-125 MG tablet  2 times daily        11/23/23 1752           An After Visit Summary was printed and given to the patient.     Plato Alspaugh K, PA-C 11/23/23 2143    Ula Prentice SAUNDERS, MD 11/27/23 [phone]

## 2023-11-23 NOTE — ED Triage Notes (Signed)
 Pt reports abscess on lower right abd since Tuesday. Tried epsom salt bath.

## 2023-11-23 NOTE — Discharge Instructions (Addendum)
Return if any problems.  See your Physician for recheck.  Return if any problems. °

## 2023-11-26 ENCOUNTER — Ambulatory Visit (HOSPITAL_BASED_OUTPATIENT_CLINIC_OR_DEPARTMENT_OTHER): Admitting: Family Medicine

## 2023-12-26 ENCOUNTER — Ambulatory Visit: Admitting: Physician Assistant
